# Patient Record
Sex: Female | Born: 1955
Health system: Southern US, Community
[De-identification: ages and names within clinical notes are randomized; demographics above are authoritative.]

## PROBLEM LIST (undated history)

## (undated) DIAGNOSIS — B019 Varicella without complication: Secondary | ICD-10-CM

## (undated) DIAGNOSIS — E785 Hyperlipidemia, unspecified: Secondary | ICD-10-CM

## (undated) DIAGNOSIS — R011 Cardiac murmur, unspecified: Secondary | ICD-10-CM

## (undated) DIAGNOSIS — M542 Cervicalgia: Secondary | ICD-10-CM

## (undated) DIAGNOSIS — K219 Gastro-esophageal reflux disease without esophagitis: Secondary | ICD-10-CM

## (undated) DIAGNOSIS — E119 Type 2 diabetes mellitus without complications: Secondary | ICD-10-CM

## (undated) DIAGNOSIS — M1990 Unspecified osteoarthritis, unspecified site: Secondary | ICD-10-CM

## (undated) DIAGNOSIS — J329 Chronic sinusitis, unspecified: Secondary | ICD-10-CM

## (undated) DIAGNOSIS — F419 Anxiety disorder, unspecified: Secondary | ICD-10-CM

## (undated) DIAGNOSIS — J449 Chronic obstructive pulmonary disease, unspecified: Secondary | ICD-10-CM

## (undated) DIAGNOSIS — F32A Depression, unspecified: Secondary | ICD-10-CM

## (undated) DIAGNOSIS — F329 Major depressive disorder, single episode, unspecified: Secondary | ICD-10-CM

## (undated) DIAGNOSIS — I10 Essential (primary) hypertension: Secondary | ICD-10-CM

## (undated) DIAGNOSIS — G8929 Other chronic pain: Secondary | ICD-10-CM

## (undated) DIAGNOSIS — F319 Bipolar disorder, unspecified: Secondary | ICD-10-CM

## (undated) DIAGNOSIS — M797 Fibromyalgia: Secondary | ICD-10-CM

## (undated) DIAGNOSIS — C3411 Malignant neoplasm of upper lobe, right bronchus or lung: Secondary | ICD-10-CM

## (undated) DIAGNOSIS — G43909 Migraine, unspecified, not intractable, without status migrainosus: Secondary | ICD-10-CM

## (undated) DIAGNOSIS — G35 Multiple sclerosis: Secondary | ICD-10-CM

## (undated) DIAGNOSIS — J302 Other seasonal allergic rhinitis: Secondary | ICD-10-CM

## (undated) HISTORY — DX: Hyperlipidemia, unspecified: E78.5

## (undated) HISTORY — DX: Essential (primary) hypertension: I10

## (undated) HISTORY — DX: Depression, unspecified: F32.A

## (undated) HISTORY — PX: FRACTURE SURGERY: SHX138

## (undated) HISTORY — PX: COLONOSCOPY: SHX174

## (undated) HISTORY — PX: COLONOSCOPY W/ BIOPSIES AND POLYPECTOMY: SHX1376

## (undated) HISTORY — DX: Unspecified osteoarthritis, unspecified site: M19.90

## (undated) HISTORY — DX: Anxiety disorder, unspecified: F41.9

## (undated) HISTORY — DX: Varicella without complication: B01.9

## (undated) HISTORY — PX: TUBAL LIGATION: SHX77

## (undated) HISTORY — PX: POLYPECTOMY: SHX149

## (undated) HISTORY — DX: Major depressive disorder, single episode, unspecified: F32.9

## (undated) HISTORY — PX: FACIAL RECONSTRUCTION SURGERY: SHX631

---
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
## 1995-01-22 HISTORY — PX: ABDOMINAL HYSTERECTOMY: SHX81

## 1995-01-22 HISTORY — PX: LAPAROSCOPIC CHOLECYSTECTOMY: SUR755

## 1996-01-22 HISTORY — PX: ABDOMINAL ADHESION SURGERY: SHX90

## 2015-01-24 DIAGNOSIS — H31091 Other chorioretinal scars, right eye: Secondary | ICD-10-CM | POA: Diagnosis not present

## 2015-01-24 DIAGNOSIS — H2513 Age-related nuclear cataract, bilateral: Secondary | ICD-10-CM | POA: Diagnosis not present

## 2015-01-24 DIAGNOSIS — H20011 Primary iridocyclitis, right eye: Secondary | ICD-10-CM | POA: Diagnosis not present

## 2015-03-02 DIAGNOSIS — E09618 Drug or chemical induced diabetes mellitus with other diabetic arthropathy: Secondary | ICD-10-CM | POA: Diagnosis not present

## 2015-03-02 DIAGNOSIS — E782 Mixed hyperlipidemia: Secondary | ICD-10-CM | POA: Diagnosis not present

## 2015-03-02 DIAGNOSIS — I10 Essential (primary) hypertension: Secondary | ICD-10-CM | POA: Diagnosis not present

## 2015-03-03 DIAGNOSIS — I10 Essential (primary) hypertension: Secondary | ICD-10-CM | POA: Diagnosis not present

## 2015-03-03 DIAGNOSIS — M797 Fibromyalgia: Secondary | ICD-10-CM | POA: Diagnosis not present

## 2015-03-03 DIAGNOSIS — E09618 Drug or chemical induced diabetes mellitus with other diabetic arthropathy: Secondary | ICD-10-CM | POA: Diagnosis not present

## 2015-03-03 DIAGNOSIS — E782 Mixed hyperlipidemia: Secondary | ICD-10-CM | POA: Diagnosis not present

## 2015-03-08 ENCOUNTER — Other Ambulatory Visit: Payer: Self-pay | Admitting: Family Medicine

## 2015-03-08 ENCOUNTER — Ambulatory Visit
Admission: RE | Admit: 2015-03-08 | Discharge: 2015-03-08 | Disposition: A | Discharge status: Home / Self Care | Payer: PPO | Source: Ambulatory Visit | Attending: Family Medicine | Admitting: Family Medicine

## 2015-03-08 DIAGNOSIS — M545 Low back pain: Secondary | ICD-10-CM | POA: Diagnosis not present

## 2015-03-08 DIAGNOSIS — M5489 Other dorsalgia: Secondary | ICD-10-CM

## 2015-03-08 DIAGNOSIS — M47816 Spondylosis without myelopathy or radiculopathy, lumbar region: Secondary | ICD-10-CM | POA: Diagnosis not present

## 2015-03-13 DIAGNOSIS — M545 Low back pain: Secondary | ICD-10-CM | POA: Diagnosis not present

## 2015-03-13 DIAGNOSIS — M6281 Muscle weakness (generalized): Secondary | ICD-10-CM | POA: Diagnosis not present

## 2015-03-27 DIAGNOSIS — M797 Fibromyalgia: Secondary | ICD-10-CM | POA: Diagnosis not present

## 2015-03-27 DIAGNOSIS — E782 Mixed hyperlipidemia: Secondary | ICD-10-CM | POA: Diagnosis not present

## 2015-03-27 DIAGNOSIS — I10 Essential (primary) hypertension: Secondary | ICD-10-CM | POA: Diagnosis not present

## 2015-03-27 DIAGNOSIS — H66001 Acute suppurative otitis media without spontaneous rupture of ear drum, right ear: Secondary | ICD-10-CM | POA: Diagnosis not present

## 2015-05-08 DIAGNOSIS — E785 Hyperlipidemia, unspecified: Secondary | ICD-10-CM | POA: Diagnosis not present

## 2015-05-08 DIAGNOSIS — G40001 Localization-related (focal) (partial) idiopathic epilepsy and epileptic syndromes with seizures of localized onset, not intractable, with status epilepticus: Secondary | ICD-10-CM | POA: Diagnosis not present

## 2015-05-08 DIAGNOSIS — Z6827 Body mass index (BMI) 27.0-27.9, adult: Secondary | ICD-10-CM | POA: Diagnosis not present

## 2015-05-08 DIAGNOSIS — I10 Essential (primary) hypertension: Secondary | ICD-10-CM | POA: Diagnosis not present

## 2015-05-08 DIAGNOSIS — E08618 Diabetes mellitus due to underlying condition with other diabetic arthropathy: Secondary | ICD-10-CM | POA: Diagnosis not present

## 2015-06-05 DIAGNOSIS — E09618 Drug or chemical induced diabetes mellitus with other diabetic arthropathy: Secondary | ICD-10-CM | POA: Diagnosis not present

## 2015-06-05 DIAGNOSIS — F17290 Nicotine dependence, other tobacco product, uncomplicated: Secondary | ICD-10-CM | POA: Diagnosis not present

## 2015-06-05 DIAGNOSIS — E782 Mixed hyperlipidemia: Secondary | ICD-10-CM | POA: Diagnosis not present

## 2015-06-05 DIAGNOSIS — G40001 Localization-related (focal) (partial) idiopathic epilepsy and epileptic syndromes with seizures of localized onset, not intractable, with status epilepticus: Secondary | ICD-10-CM | POA: Diagnosis not present

## 2015-06-05 DIAGNOSIS — E785 Hyperlipidemia, unspecified: Secondary | ICD-10-CM | POA: Diagnosis not present

## 2015-07-13 ENCOUNTER — Other Ambulatory Visit: Payer: Self-pay

## 2015-07-13 ENCOUNTER — Emergency Department (HOSPITAL_COMMUNITY)
Admission: EM | Admit: 2015-07-13 | Discharge: 2015-07-13 | Disposition: A | Discharge status: Home / Self Care | Payer: PPO | Attending: Emergency Medicine | Admitting: Emergency Medicine

## 2015-07-13 ENCOUNTER — Encounter (HOSPITAL_COMMUNITY): Payer: Self-pay | Admitting: Nurse Practitioner

## 2015-07-13 ENCOUNTER — Emergency Department (HOSPITAL_COMMUNITY): Payer: PPO

## 2015-07-13 DIAGNOSIS — F17200 Nicotine dependence, unspecified, uncomplicated: Secondary | ICD-10-CM | POA: Diagnosis not present

## 2015-07-13 DIAGNOSIS — R079 Chest pain, unspecified: Secondary | ICD-10-CM | POA: Insufficient documentation

## 2015-07-13 DIAGNOSIS — E119 Type 2 diabetes mellitus without complications: Secondary | ICD-10-CM | POA: Insufficient documentation

## 2015-07-13 DIAGNOSIS — R05 Cough: Secondary | ICD-10-CM | POA: Diagnosis not present

## 2015-07-13 DIAGNOSIS — M546 Pain in thoracic spine: Secondary | ICD-10-CM

## 2015-07-13 HISTORY — DX: Fibromyalgia: M79.7

## 2015-07-13 HISTORY — DX: Multiple sclerosis: G35

## 2015-07-13 HISTORY — DX: Bipolar disorder, unspecified: F31.9

## 2015-07-13 LAB — CBC
HCT: 47.2 % — ABNORMAL HIGH (ref 36.0–46.0)
Hemoglobin: 16 g/dL — ABNORMAL HIGH (ref 12.0–15.0)
MCH: 28.2 pg (ref 26.0–34.0)
MCHC: 33.9 g/dL (ref 30.0–36.0)
MCV: 83.2 fL (ref 78.0–100.0)
PLATELETS: 240 10*3/uL (ref 150–400)
RBC: 5.67 MIL/uL — AB (ref 3.87–5.11)
RDW: 14.1 % (ref 11.5–15.5)
WBC: 10.9 10*3/uL — ABNORMAL HIGH (ref 4.0–10.5)

## 2015-07-13 LAB — BASIC METABOLIC PANEL
Anion gap: 11 (ref 5–15)
BUN: 10 mg/dL (ref 6–20)
CALCIUM: 10.3 mg/dL (ref 8.9–10.3)
CO2: 23 mmol/L (ref 22–32)
CREATININE: 0.86 mg/dL (ref 0.44–1.00)
Chloride: 103 mmol/L (ref 101–111)
GFR calc non Af Amer: >60 mL/min (ref >60)
Glucose, Bld: 133 mg/dL — ABNORMAL HIGH (ref 65–99)
Potassium: 4 mmol/L (ref 3.5–5.1)
SODIUM: 137 mmol/L (ref 135–145)

## 2015-07-13 LAB — I-STAT TROPONIN, ED
TROPONIN I, POC: 0 ng/mL (ref 0.00–0.08)
Troponin i, poc: 0 ng/mL (ref 0.00–0.08)

## 2015-07-13 MED ORDER — MORPHINE SULFATE (PF) 4 MG/ML IV SOLN
4.0000 mg | Freq: Once | INTRAVENOUS | Status: AC
Start: 2015-07-13 — End: 2015-07-13
  Administered 2015-07-13: 4 mg via INTRAVENOUS
  Filled 2015-07-13: qty 1

## 2015-07-13 MED ORDER — MORPHINE SULFATE (PF) 4 MG/ML IV SOLN
4.0000 mg | Freq: Once | INTRAVENOUS | Status: AC
  Administered 2015-07-13: 4 mg via INTRAVENOUS
  Filled 2015-07-13: qty 1

## 2015-07-13 MED ORDER — ASPIRIN 81 MG PO CHEW
324.0000 mg | CHEWABLE_TABLET | Freq: Once | ORAL | Status: AC
  Administered 2015-07-13: 324 mg via ORAL
  Filled 2015-07-13: qty 4

## 2015-07-13 MED ORDER — CYCLOBENZAPRINE HCL 10 MG PO TABS: 10.0000 mg | ORAL_TABLET | Freq: Two times a day (BID) | ORAL | Status: DC | PRN

## 2015-07-13 MED ORDER — HYDROCODONE-ACETAMINOPHEN 5-325 MG PO TABS: 1.0000 | ORAL_TABLET | Freq: Four times a day (QID) | ORAL | Status: DC | PRN

## 2015-07-13 NOTE — ED Provider Notes (Signed)
CSN: [id number]     Arrival date & time 07/13/15  1321 History   First MD Initiated Contact with Patient 07/13/15 1615     Chief Complaint  Patient presents with  . Chest Pain     (Consider location/radiation/quality/duration/timing/severity/associated sxs/prior Treatment) HPI Comments: Patient presents to the emergency department with chief complaint of left-sided chest pain. She states pain started last night. She reports that the pain radiates to her left arm. She reports associated shortness of breath. She states that the pain came on while at rest. She states that she had a similar episode about a week ago. She denies any exertional symptoms. She states that she has had some productive cough, but denies any fevers. She denies any associated nausea or vomiting. She states that she is very concerned about her heart because her mother had severe coronary artery disease. She reports taking some ibuprofen last night with no relief. There are no modifying factors.  The history is provided by the patient. No language interpreter was used.    Past Medical History  Diagnosis Date  . Diabetes mellitus without complication ([place])   . Bipolar disorder ([place])   . Multiple sclerosis ([place])   . Fibromyalgia    Past Surgical History  Procedure Laterality Date  . Facial reconstruction surgery     History reviewed. No pertinent family history. Social History  Substance Use Topics  . Smoking status: Current Every Day Smoker  . Smokeless tobacco: None  . Alcohol Use: No   OB History    No data available     Review of Systems  Constitutional: Negative for fever and chills.  Respiratory: Positive for shortness of breath.   Cardiovascular: Positive for chest pain.  Gastrointestinal: Negative for nausea, vomiting, diarrhea and constipation.  Genitourinary: Negative for dysuria.  All other systems reviewed and are negative.     Allergies  Review of patient's allergies indicates no known  allergies.  Home Medications   Prior to Admission medications   Not on File   BP 132/101 mmHg  Pulse 98  Temp(Src) 98.4 F (36.9 C) (Oral)  Resp 19  SpO2 99% Physical Exam  Constitutional: She is oriented to person, place, and time. She appears well-developed and well-nourished.  HENT:  Head: Normocephalic and atraumatic.  Eyes: Conjunctivae and EOM are normal. Pupils are equal, round, and reactive to light.  Neck: Normal range of motion. Neck supple.  Cardiovascular: Normal rate and regular rhythm.  Exam reveals no gallop and no friction rub.   No murmur heard. Pulmonary/Chest: Effort normal and breath sounds normal. No respiratory distress. She has no wheezes. She has no rales. She exhibits no tenderness.  Abdominal: Soft. Bowel sounds are normal. She exhibits no distension and no mass. There is no tenderness. There is no rebound and no guarding.  Musculoskeletal: Normal range of motion. She exhibits no edema or tenderness.  Neurological: She is alert and oriented to person, place, and time.  Skin: Skin is warm and dry.  Psychiatric: She has a normal mood and affect. Her behavior is normal. Judgment and thought content normal.  Nursing note and vitals reviewed.   ED Course  Procedures (including critical care time) Results for orders placed or performed during the hospital encounter of 78/29/56  Basic metabolic panel  Result Value Ref Range   Sodium 137 135 - 145 mmol/L   Potassium 4.0 3.5 - 5.1 mmol/L   Chloride 103 101 - 111 mmol/L   CO2 23 22 - 32 mmol/L  Glucose, Bld 133 (H) 65 - 99 mg/dL   BUN 10 6 - 20 mg/dL   Creatinine, Ser 0.86 0.44 - 1.00 mg/dL   Calcium 10.3 8.9 - 10.3 mg/dL   GFR calc non Af Amer >60 >60 mL/min   GFR calc Af Amer >60 >60 mL/min   Anion gap 11 5 - 15  CBC  Result Value Ref Range   WBC 10.9 (H) 4.0 - 10.5 K/uL   RBC 5.67 (H) 3.87 - 5.11 MIL/uL   Hemoglobin 16.0 (H) 12.0 - 15.0 g/dL   HCT 47.2 (H) 36.0 - 46.0 %   MCV 83.2 78.0 - 100.0  fL   MCH 28.2 26.0 - 34.0 pg   MCHC 33.9 30.0 - 36.0 g/dL   RDW 14.1 11.5 - 15.5 %   Platelets 240 150 - 400 K/uL  I-stat troponin, ED  Result Value Ref Range   Troponin i, poc 0.00 0.00 - 0.08 ng/mL   Comment 3          I-stat troponin, ED  Result Value Ref Range   Troponin i, poc 0.00 0.00 - 0.08 ng/mL   Comment 3           Dg Chest 2 View  07/13/2015  CLINICAL DATA:  Left sided lower back and left upper anterior chest pain with left arm pain (numb) x last weekend, cough (dry) x 1 month, chills, HTN, diabetic, past smoker - quit 2 weeks ago, was smoking 1/2 ppd x age 68, no heart/lung surgeries EXAM: CHEST  2 VIEW COMPARISON:  None. FINDINGS: Heart size is normal. There is minimal subsegmental atelectasis or scarring at the lung bases. No focal consolidations or pleural effusions. No pulmonary edema. Visualized osseous structures have a normal appearance. IMPRESSION: 1. Streaky atelectasis or scarring at the bases. 2.  No evidence for acute cardiopulmonary abnormality. Electronically Signed   By: Nolon Nations M.D.   On: 07/13/2015 13:57     Imaging Review Dg Chest 2 View  07/13/2015  CLINICAL DATA:  Left sided lower back and left upper anterior chest pain with left arm pain (numb) x last weekend, cough (dry) x 1 month, chills, HTN, diabetic, past smoker - quit 2 weeks ago, was smoking 1/2 ppd x age 60, no heart/lung surgeries EXAM: CHEST  2 VIEW COMPARISON:  None. FINDINGS: Heart size is normal. There is minimal subsegmental atelectasis or scarring at the lung bases. No focal consolidations or pleural effusions. No pulmonary edema. Visualized osseous structures have a normal appearance. IMPRESSION: 1. Streaky atelectasis or scarring at the bases. 2.  No evidence for acute cardiopulmonary abnormality. Electronically Signed   By: Nolon Nations M.D.   On: 07/13/2015 13:57   I have personally reviewed and evaluated these images and lab results as part of my medical decision-making.    EKG Interpretation None      MDM   Final diagnoses:  Chest pain, unspecified chest pain type  Bilateral thoracic back pain    Patient with chief complaint of chest pain. Pain came on last night. It is been constant until now. She reports associated shortness of breath, left arm pain, and diaphoresis. She reports some associated shortness of breath.  Discussed with Dr. Venora Maples, who states that pain sounds atypical.  Patient seems quite nervous at the bedside, ?anxiety.  Will check delta troponin and EKG.  Wells' PE criteria is 1.5- low risk.  PE considered, but thought to be less likely.    Delta trop and EKG are  negative.  Patient states that she doesn't have any additional chest pain, but she does complain of some back pain from when she had a recent mechanical fall.  Will given something for muscle aches and spasm.       Montine Circle, PA-C 07/13/15 2009  Jola Schmidt, MD 07/14/15 [phone]

## 2015-07-13 NOTE — ED Notes (Signed)
she c/o 1 day history of L upper back pain radiating into her L chest, abd, and L arm. She tried ibuprofen for the pain yesterday with no relief. she woke today with worsening pain. She c/o productive cough, sob, chills. Denies n/v, dizziness. She is alert and breathing easily

## 2015-07-13 NOTE — Discharge Instructions (Signed)
Back Pain, Adult °Back pain is very common in adults. The cause of back pain is rarely dangerous and the pain often gets better over time. The cause of your back pain may not be known. Some common causes of back pain include: °· Strain of the muscles or ligaments supporting the spine. °· Wear and tear (degeneration) of the spinal disks. °· Arthritis. °· Direct injury to the back. °For many people, back pain may return. Since back pain is rarely dangerous, most people can learn to manage this condition on their own. °HOME CARE INSTRUCTIONS °Watch your back pain for any changes. The following actions may help to lessen any discomfort you are feeling: °· Remain active. It is stressful on your back to sit or stand in one place for long periods of time. Do not sit, drive, or stand in one place for more than 30 minutes at a time. Take short walks on even surfaces as soon as you are able. Try to increase the length of time you walk each day. °· Exercise regularly as directed by your health care provider. Exercise helps your back heal faster. It also helps avoid future injury by keeping your muscles strong and flexible. °· Do not stay in bed. Resting more than 1-2 days can delay your recovery. °· Pay attention to your body when you bend and lift. The most comfortable positions are those that put less stress on your recovering back. Always use proper lifting techniques, including: °· Bending your knees. °· Keeping the load close to your body. °· Avoiding twisting. °· Find a comfortable position to sleep. Use a firm mattress and lie on your side with your knees slightly bent. If you lie on your back, put a pillow under your knees. °· Avoid feeling anxious or stressed. Stress increases muscle tension and can worsen back pain. It is important to recognize when you are anxious or stressed and learn ways to manage it, such as with exercise. °· Take medicines only as directed by your health care provider. Over-the-counter  medicines to reduce pain and inflammation are often the most helpful. Your health care provider may prescribe muscle relaxant drugs. These medicines help dull your pain so you can more quickly return to your normal activities and healthy exercise. °· Apply ice to the injured area: °· Put ice in a plastic bag. °· Place a towel between your skin and the bag. °· Leave the ice on for 20 minutes, 2-3 times a day for the first 2-3 days. After that, ice and heat may be alternated to reduce pain and spasms. °· Maintain a healthy weight. Excess weight puts extra stress on your back and makes it difficult to maintain good posture. °SEEK MEDICAL CARE IF: °· You have pain that is not relieved with rest or medicine. °· You have increasing pain going down into the legs or buttocks. °· You have pain that does not improve in one week. °· You have night pain. °· You lose weight. °· You have a fever or chills. °SEEK IMMEDIATE MEDICAL CARE IF:  °· You develop new bowel or bladder control problems. °· You have unusual weakness or numbness in your arms or legs. °· You develop nausea or vomiting. °· You develop abdominal pain. °· You feel faint. °  °This information is not intended to replace advice given to you by your health care provider. Make sure you discuss any questions you have with your health care provider. °  °Document Released: 01/07/2005 Document Revised: 01/28/2014 Document Reviewed: 05/11/2013 °Elsevier Interactive Patient Education ©2016 Elsevier   Inc. ° °Nonspecific Chest Pain  °Chest pain can be caused by many different conditions. There is always a chance that your pain could be related to something serious, such as a heart attack or a blood clot in your lungs. Chest pain can also be caused by conditions that are not life-threatening. If you have chest pain, it is very important to follow up with your health care provider. °CAUSES  °Chest pain can be caused by: °· Heartburn. °· Pneumonia or bronchitis. °· Anxiety or  stress. °· Inflammation around your heart (pericarditis) or lung (pleuritis or pleurisy). °· A blood clot in your lung. °· A collapsed lung (pneumothorax). It can develop suddenly on its own (spontaneous pneumothorax) or from trauma to the chest. °· Shingles infection (varicella-zoster virus). °· Heart attack. °· Damage to the bones, muscles, and cartilage that make up your chest wall. This can include: °¨ Bruised bones due to injury. °¨ Strained muscles or cartilage due to frequent or repeated coughing or overwork. °¨ Fracture to one or more ribs. °¨ Sore cartilage due to inflammation (costochondritis). °RISK FACTORS  °Risk factors for chest pain may include: °· Activities that increase your risk for trauma or injury to your chest. °· Respiratory infections or conditions that cause frequent coughing. °· Medical conditions or overeating that can cause heartburn. °· Heart disease or family history of heart disease. °· Conditions or health behaviors that increase your risk of developing a blood clot. °· Having had chicken pox (varicella zoster). °SIGNS AND SYMPTOMS °Chest pain can feel like: °· Burning or tingling on the surface of your chest or deep in your chest. °· Crushing, pressure, aching, or squeezing pain. °· Dull or sharp pain that is worse when you move, cough, or take a deep breath. °· Pain that is also felt in your back, neck, shoulder, or arm, or pain that spreads to any of these areas. °Your chest pain may come and go, or it may stay constant. °DIAGNOSIS °Lab tests or other studies may be needed to find the cause of your pain. Your health care provider may have you take a test called an ambulatory ECG (electrocardiogram). An ECG records your heartbeat patterns at the time the test is performed. You may also have other tests, such as: °· Transthoracic echocardiogram (TTE). During echocardiography, sound waves are used to create a picture of all of the heart structures and to look at how blood flows  through your heart. °· Transesophageal echocardiogram (TEE). This is a more advanced imaging test that obtains images from inside your body. It allows your health care provider to see your heart in finer detail. °· Cardiac monitoring. This allows your health care provider to monitor your heart rate and rhythm in real time. °· Holter monitor. This is a portable device that records your heartbeat and can help to diagnose abnormal heartbeats. It allows your health care provider to track your heart activity for several days, if needed. °· Stress tests. These can be done through exercise or by taking medicine that makes your heart beat more quickly. °· Blood tests. °· Imaging tests. °TREATMENT  °Your treatment depends on what is causing your chest pain. Treatment may include: °· Medicines. These may include: °¨ Acid blockers for heartburn. °¨ Anti-inflammatory medicine. °¨ Pain medicine for inflammatory conditions. °¨ Antibiotic medicine, if an infection is present. °¨ Medicines to dissolve blood clots. °¨ Medicines to treat coronary artery disease. °· Supportive care for conditions that do not require medicines. This may include: °¨ Resting. °¨   Applying heat or cold packs to injured areas. °¨ Limiting activities until pain decreases. °HOME CARE INSTRUCTIONS °· If you were prescribed an antibiotic medicine, finish it all even if you start to feel better. °· Avoid any activities that bring on chest pain. °· Do not use any tobacco products, including cigarettes, chewing tobacco, or electronic cigarettes. If you need help quitting, ask your health care provider. °· Do not drink alcohol. °· Take medicines only as directed by your health care provider. °· Keep all follow-up visits as directed by your health care provider. This is important. This includes any further testing if your chest pain does not go away. °· If heartburn is the cause for your chest pain, you may be told to keep your head raised (elevated) while sleeping.  This reduces the chance that acid will go from your stomach into your esophagus. °· Make lifestyle changes as directed by your health care provider. These may include: °¨ Getting regular exercise. Ask your health care provider to suggest some activities that are safe for you. °¨ Eating a heart-healthy diet. A registered dietitian can help you to learn healthy eating options. °¨ Maintaining a healthy weight. °¨ Managing diabetes, if necessary. °¨ Reducing stress. °SEEK MEDICAL CARE IF: °· Your chest pain does not go away after treatment. °· You have a rash with blisters on your chest. °· You have a fever. °SEEK IMMEDIATE MEDICAL CARE IF:  °· Your chest pain is worse. °· You have an increasing cough, or you cough up blood. °· You have severe abdominal pain. °· You have severe weakness. °· You faint. °· You have chills. °· You have sudden, unexplained chest discomfort. °· You have sudden, unexplained discomfort in your arms, back, neck, or jaw. °· You have shortness of breath at any time. °· You suddenly start to sweat, or your skin gets clammy. °· You feel nauseous or you vomit. °· You suddenly feel light-headed or dizzy. °· Your heart begins to beat quickly, or it feels like it is skipping beats. °These symptoms may represent a serious problem that is an emergency. Do not wait to see if the symptoms will go away. Get medical help right away. Call your local emergency services (911 in the [place] Do not drive yourself to the hospital. °  °This information is not intended to replace advice given to you by your health care provider. Make sure you discuss any questions you have with your health care provider. °  °Document Released: 10/17/2004 Document Revised: 01/28/2014 Document Reviewed: 08/13/2013 °Elsevier Interactive Patient Education ©2016 Elsevier Inc. ° °

## 2015-10-02 DIAGNOSIS — E782 Mixed hyperlipidemia: Secondary | ICD-10-CM | POA: Diagnosis not present

## 2015-10-02 DIAGNOSIS — M797 Fibromyalgia: Secondary | ICD-10-CM | POA: Diagnosis not present

## 2015-10-02 DIAGNOSIS — F17290 Nicotine dependence, other tobacco product, uncomplicated: Secondary | ICD-10-CM | POA: Diagnosis not present

## 2015-10-02 DIAGNOSIS — G40001 Localization-related (focal) (partial) idiopathic epilepsy and epileptic syndromes with seizures of localized onset, not intractable, with status epilepticus: Secondary | ICD-10-CM | POA: Diagnosis not present

## 2015-10-02 DIAGNOSIS — I10 Essential (primary) hypertension: Secondary | ICD-10-CM | POA: Diagnosis not present

## 2015-10-02 DIAGNOSIS — E09618 Drug or chemical induced diabetes mellitus with other diabetic arthropathy: Secondary | ICD-10-CM | POA: Diagnosis not present

## 2015-10-03 DIAGNOSIS — M797 Fibromyalgia: Secondary | ICD-10-CM | POA: Diagnosis not present

## 2015-10-03 DIAGNOSIS — G40011 Localization-related (focal) (partial) idiopathic epilepsy and epileptic syndromes with seizures of localized onset, intractable, with status epilepticus: Secondary | ICD-10-CM | POA: Diagnosis not present

## 2015-10-03 DIAGNOSIS — E782 Mixed hyperlipidemia: Secondary | ICD-10-CM | POA: Diagnosis not present

## 2015-10-03 DIAGNOSIS — Z6828 Body mass index (BMI) 28.0-28.9, adult: Secondary | ICD-10-CM | POA: Diagnosis not present

## 2015-11-17 DIAGNOSIS — Z23 Encounter for immunization: Secondary | ICD-10-CM | POA: Diagnosis not present

## 2015-11-17 DIAGNOSIS — E09618 Drug or chemical induced diabetes mellitus with other diabetic arthropathy: Secondary | ICD-10-CM | POA: Diagnosis not present

## 2015-11-17 DIAGNOSIS — F17290 Nicotine dependence, other tobacco product, uncomplicated: Secondary | ICD-10-CM | POA: Diagnosis not present

## 2015-11-17 DIAGNOSIS — E785 Hyperlipidemia, unspecified: Secondary | ICD-10-CM | POA: Diagnosis not present

## 2015-11-17 DIAGNOSIS — G40001 Localization-related (focal) (partial) idiopathic epilepsy and epileptic syndromes with seizures of localized onset, not intractable, with status epilepticus: Secondary | ICD-10-CM | POA: Diagnosis not present

## 2015-11-28 DIAGNOSIS — H2513 Age-related nuclear cataract, bilateral: Secondary | ICD-10-CM | POA: Diagnosis not present

## 2015-11-28 DIAGNOSIS — H02421 Myogenic ptosis of right eyelid: Secondary | ICD-10-CM | POA: Diagnosis not present

## 2015-11-28 DIAGNOSIS — E119 Type 2 diabetes mellitus without complications: Secondary | ICD-10-CM | POA: Diagnosis not present

## 2015-11-28 DIAGNOSIS — H20011 Primary iridocyclitis, right eye: Secondary | ICD-10-CM | POA: Diagnosis not present

## 2015-11-28 DIAGNOSIS — H31091 Other chorioretinal scars, right eye: Secondary | ICD-10-CM | POA: Diagnosis not present

## 2016-01-01 DIAGNOSIS — E782 Mixed hyperlipidemia: Secondary | ICD-10-CM | POA: Diagnosis not present

## 2016-01-01 DIAGNOSIS — I10 Essential (primary) hypertension: Secondary | ICD-10-CM | POA: Diagnosis not present

## 2016-01-01 DIAGNOSIS — E785 Hyperlipidemia, unspecified: Secondary | ICD-10-CM | POA: Diagnosis not present

## 2016-01-01 DIAGNOSIS — G40001 Localization-related (focal) (partial) idiopathic epilepsy and epileptic syndromes with seizures of localized onset, not intractable, with status epilepticus: Secondary | ICD-10-CM | POA: Diagnosis not present

## 2016-02-24 DIAGNOSIS — I10 Essential (primary) hypertension: Secondary | ICD-10-CM | POA: Diagnosis not present

## 2016-02-24 DIAGNOSIS — G40001 Localization-related (focal) (partial) idiopathic epilepsy and epileptic syndromes with seizures of localized onset, not intractable, with status epilepticus: Secondary | ICD-10-CM | POA: Diagnosis not present

## 2016-02-29 ENCOUNTER — Institutional Professional Consult (permissible substitution): Payer: Self-pay | Admitting: Family Medicine

## 2016-03-25 DIAGNOSIS — I10 Essential (primary) hypertension: Secondary | ICD-10-CM | POA: Diagnosis not present

## 2016-03-25 DIAGNOSIS — E09618 Drug or chemical induced diabetes mellitus with other diabetic arthropathy: Secondary | ICD-10-CM | POA: Diagnosis not present

## 2016-03-25 DIAGNOSIS — F17290 Nicotine dependence, other tobacco product, uncomplicated: Secondary | ICD-10-CM | POA: Diagnosis not present

## 2016-03-25 DIAGNOSIS — G40011 Localization-related (focal) (partial) idiopathic epilepsy and epileptic syndromes with seizures of localized onset, intractable, with status epilepticus: Secondary | ICD-10-CM | POA: Diagnosis not present

## 2016-03-25 DIAGNOSIS — G40001 Localization-related (focal) (partial) idiopathic epilepsy and epileptic syndromes with seizures of localized onset, not intractable, with status epilepticus: Secondary | ICD-10-CM | POA: Diagnosis not present

## 2016-03-27 DIAGNOSIS — M542 Cervicalgia: Secondary | ICD-10-CM | POA: Diagnosis not present

## 2016-04-04 ENCOUNTER — Encounter: Payer: Self-pay | Admitting: Nurse Practitioner

## 2016-04-04 ENCOUNTER — Ambulatory Visit (INDEPENDENT_AMBULATORY_CARE_PROVIDER_SITE_OTHER): Payer: PPO | Admitting: Nurse Practitioner

## 2016-04-04 VITALS — BP 118/70 | HR 91 | Temp 98.4°F | Ht 68.0 in | Wt 173.0 lb

## 2016-04-04 DIAGNOSIS — R29898 Other symptoms and signs involving the musculoskeletal system: Secondary | ICD-10-CM | POA: Diagnosis not present

## 2016-04-04 DIAGNOSIS — I10 Essential (primary) hypertension: Secondary | ICD-10-CM | POA: Diagnosis not present

## 2016-04-04 DIAGNOSIS — G8929 Other chronic pain: Secondary | ICD-10-CM | POA: Insufficient documentation

## 2016-04-04 DIAGNOSIS — F325 Major depressive disorder, single episode, in full remission: Secondary | ICD-10-CM | POA: Insufficient documentation

## 2016-04-04 DIAGNOSIS — E118 Type 2 diabetes mellitus with unspecified complications: Secondary | ICD-10-CM

## 2016-04-04 DIAGNOSIS — F339 Major depressive disorder, recurrent, unspecified: Secondary | ICD-10-CM

## 2016-04-04 DIAGNOSIS — F329 Major depressive disorder, single episode, unspecified: Secondary | ICD-10-CM

## 2016-04-04 DIAGNOSIS — E119 Type 2 diabetes mellitus without complications: Secondary | ICD-10-CM | POA: Insufficient documentation

## 2016-04-04 DIAGNOSIS — E1169 Type 2 diabetes mellitus with other specified complication: Secondary | ICD-10-CM | POA: Insufficient documentation

## 2016-04-04 DIAGNOSIS — G894 Chronic pain syndrome: Secondary | ICD-10-CM | POA: Diagnosis not present

## 2016-04-04 DIAGNOSIS — F319 Bipolar disorder, unspecified: Secondary | ICD-10-CM | POA: Insufficient documentation

## 2016-04-04 DIAGNOSIS — E781 Pure hyperglyceridemia: Secondary | ICD-10-CM | POA: Insufficient documentation

## 2016-04-04 DIAGNOSIS — E785 Hyperlipidemia, unspecified: Secondary | ICD-10-CM | POA: Diagnosis not present

## 2016-04-04 DIAGNOSIS — Z0000 Encounter for general adult medical examination without abnormal findings: Secondary | ICD-10-CM | POA: Diagnosis not present

## 2016-04-04 NOTE — Progress Notes (Signed)
Pre visit review using our clinic review tool, if applicable. No additional management support is needed unless otherwise documented below in the visit note. 

## 2016-04-04 NOTE — Progress Notes (Signed)
Subjective:    Patient ID: Audrey Russell, female    DOB: 10-06-1955, 61 y.o.   MRN: [id number]  Patient presents today for establish care (new patient)  HPI Moved from [place] to [place] last year.   Previous pcp was Dr. Lucianne Lei with [facility]. Last seen 03/25/2016.   Diabetes: She has concerned about possible adverse effects of Janumet due to class action lawsuit advertised on TV. She does not remember which adverse reaction that was mentioned. She reports that her Dr. Criss Rosales was to switch medication but never did. But does not remember name of medication. Does not remember last Hgba1c.  Chronic pain and leg weakness:  ongoing for several years, waxing and waning, referred to [place], started PT last week, can't afford to go BID as recommended.  Hx of MS: no current treatment. Previous use of oral prednisone by neurologist in [place].  Bipolar Depression: managed with Lamictal, citalopram, klonopin, and Remeron. Denies any mood swings.  Immunizations: (TDAP, Hep C screen, Pneumovax, Influenza, zoster)  Health Maintenance  Topic Date Due  . Hemoglobin A1C  02/11/1955  .  Hepatitis C: One time screening is recommended by Center for Disease Control  (CDC) for  adults born from [birth year] through 1965.   30-Nov-1955  . Pneumococcal vaccine (1) 01/05/1958  . Complete foot exam   01/05/1966  . Eye exam for diabetics  01/05/1966  . HIV Screening  01/06/1971  . Tetanus Vaccine  01/06/1975  . Pap Smear  01/05/1977  . Mammogram  01/05/2006  . Colon Cancer Screening  01/05/2006  . Flu Shot  Addressed   Diet:regular Weight:  Wt Readings from Last 3 Encounters:  04/04/16 173 lb (78.5 kg)    Exercise:none Fall Risk: Fall Risk  04/04/2016  Falls in the past year? No   Home Safety:home with daughter Depression/Suicide: Depression screen [facility] 2/9 04/04/2016  Decreased Interest 1  Down, Depressed, Hopeless 1  PHQ - 2 Score 2  Altered sleeping 3  Tired, decreased  energy 3  Change in appetite 1  Feeling bad or failure about yourself  0  Trouble concentrating 1  Moving slowly or fidgety/restless 0  Suicidal thoughts 0  PHQ-9 Score 10   No flowsheet data found.  Medications and allergies reviewed with patient and updated if appropriate.  Patient Active Problem List   Diagnosis Date Noted  . HTN (hypertension), benign 04/04/2016  . DM (diabetes mellitus) ([place]) 04/04/2016  . Chronic pain 04/04/2016  . Depression 04/04/2016  . Hyperlipidemia 04/04/2016  . Leg weakness, bilateral 04/04/2016    Current Outpatient Prescriptions on File Prior to Visit  Medication Sig Dispense Refill  . acetaminophen (TYLENOL) 500 MG tablet Take 1,000 mg by mouth every 6 (six) hours as needed for mild pain or moderate pain.    . citalopram (CELEXA) 40 MG tablet Take 40 mg by mouth at bedtime.     . clonazePAM (KLONOPIN) 2 MG tablet Take 2-4 mg by mouth at bedtime.    Marland Kitchen ezetimibe (ZETIA) 10 MG tablet Take 10 mg by mouth every evening.    Marland Kitchen ibuprofen (ADVIL,MOTRIN) 200 MG tablet Take 200-400 mg by mouth every 6 (six) hours as needed for mild pain or moderate pain.    Marland Kitchen JANUMET XR [phone] MG TB24 Take 1 tablet by mouth at bedtime.    . lamoTRIgine (LAMICTAL) 25 MG tablet Take 50 mg by mouth 2 (two) times daily.     . mirtazapine (REMERON) 30 MG tablet Take 30 mg by  mouth at bedtime.    . ranitidine (ZANTAC) 300 MG tablet Take 300 mg by mouth at bedtime.     No current facility-administered medications on file prior to visit.     Past Medical History:  Diagnosis Date  . Arthritis   . Bipolar disorder ([place])   . Chickenpox   . Depression   . Diabetes mellitus without complication ([place])   . Fibromyalgia   . Hyperlipidemia   . Hypertension   . Multiple sclerosis ([place])     Past Surgical History:  Procedure Laterality Date  . ABDOMINAL HYSTERECTOMY  1997&1998   partial  . CHOLECYSTECTOMY  1997  . FACIAL RECONSTRUCTION SURGERY      Social History    Social History  . Marital status: Widowed    Spouse name: N/A  . Number of children: N/A  . Years of education: N/A   Social History Main Topics  . Smoking status: Former Research scientist (life sciences)  . Smokeless tobacco: Never Used  . Alcohol use No  . Drug use: No  . Sexual activity: Not Asked   Other Topics Concern  . None   Social History Narrative  . None    Family History  Problem Relation Age of Onset  . Arthritis Mother   . Heart disease Mother   . Stroke Mother   . Hypertension Mother   . Epilepsy Mother   . Heart disease Father   . Hypertension Father   . Diabetes Father   . Arthritis Maternal Grandmother   . Diabetes Paternal Grandmother         Review of Systems  Constitutional: Negative for fever, malaise/fatigue and weight loss.  HENT: Negative for congestion and sore throat.   Eyes:       Negative for visual changes  Respiratory: Negative for cough and shortness of breath.   Cardiovascular: Negative for chest pain, palpitations and leg swelling.  Gastrointestinal: Negative for blood in stool, constipation, diarrhea and heartburn.  Genitourinary: Negative for dysuria, frequency and urgency.  Musculoskeletal: Positive for joint pain, myalgias and neck pain. Negative for falls.  Skin: Negative for rash.  Neurological: Positive for focal weakness. Negative for dizziness, sensory change and headaches.  Endo/Heme/Allergies: Does not bruise/bleed easily.  Psychiatric/Behavioral: Positive for depression. Negative for hallucinations, memory loss, substance abuse and suicidal ideas. The patient is nervous/anxious and has insomnia.     Objective:   Vitals:   04/04/16 1423  BP: 118/70  Pulse: 91  Temp: 98.4 F (36.9 C)    Body mass index is 26.3 kg/m.   Physical Examination:  Physical Exam  Constitutional: She is oriented to person, place, and time. No distress.  Neck: Normal range of motion. Neck supple.  Cardiovascular: Normal rate, regular rhythm and normal  heart sounds.   Pulmonary/Chest: Effort normal and breath sounds normal.  Abdominal: Soft.  Musculoskeletal: Normal range of motion. She exhibits no edema or tenderness.  Neurological: She is alert and oriented to person, place, and time. She has normal reflexes. She displays weakness. She displays no tremor, facial symmetry and normal speech. No cranial nerve deficit. Gait normal. Coordination and gait normal.  Left LE weakness 4/5  Skin: Skin is warm and dry.  Vitals reviewed.   ASSESSMENT and PLAN:  Audrey Russell was seen today for establish care.  Diagnoses and all orders for this visit:  Encounter for medical examination to establish care  HTN (hypertension), benign  Type 2 diabetes mellitus with complication, without long-term current use of insulin ([place])  Chronic  pain syndrome -     Ambulatory referral to Neurology  Episode of recurrent major depressive disorder, unspecified depression episode severity ([place])  Hyperlipidemia, unspecified hyperlipidemia type  Leg weakness, bilateral -     Ambulatory referral to Neurology   No problem-specific Assessment & Plan notes found for this encounter.     Follow up: Return in about 1 month (around 05/05/2016) for DM and HTN, hyperlipidemia.  Wilfred Lacy, NP

## 2016-04-04 NOTE — Patient Instructions (Addendum)
Need records from [facility]. (sign medical release).  Will need records from previous pcp prior to filling any prescription and/or making any medication changes.

## 2016-05-02 ENCOUNTER — Ambulatory Visit: Payer: Self-pay | Admitting: Neurology

## 2016-05-07 ENCOUNTER — Ambulatory Visit: Payer: PPO | Admitting: Nurse Practitioner

## 2016-05-24 ENCOUNTER — Encounter: Payer: Self-pay | Admitting: Internal Medicine

## 2016-05-24 ENCOUNTER — Other Ambulatory Visit (INDEPENDENT_AMBULATORY_CARE_PROVIDER_SITE_OTHER): Payer: PPO

## 2016-05-24 ENCOUNTER — Ambulatory Visit (INDEPENDENT_AMBULATORY_CARE_PROVIDER_SITE_OTHER): Payer: PPO | Admitting: Internal Medicine

## 2016-05-24 ENCOUNTER — Ambulatory Visit: Payer: PPO | Admitting: Nurse Practitioner

## 2016-05-24 VITALS — BP 100/64 | HR 100 | Ht 68.0 in | Wt 168.0 lb

## 2016-05-24 DIAGNOSIS — R109 Unspecified abdominal pain: Secondary | ICD-10-CM | POA: Diagnosis not present

## 2016-05-24 DIAGNOSIS — G35 Multiple sclerosis: Secondary | ICD-10-CM | POA: Insufficient documentation

## 2016-05-24 DIAGNOSIS — R197 Diarrhea, unspecified: Secondary | ICD-10-CM | POA: Diagnosis not present

## 2016-05-24 DIAGNOSIS — I10 Essential (primary) hypertension: Secondary | ICD-10-CM | POA: Diagnosis not present

## 2016-05-24 DIAGNOSIS — F319 Bipolar disorder, unspecified: Secondary | ICD-10-CM | POA: Insufficient documentation

## 2016-05-24 LAB — URINALYSIS, ROUTINE W REFLEX MICROSCOPIC
Hgb urine dipstick: NEGATIVE
KETONES UR: NEGATIVE
NITRITE: NEGATIVE
PH: 5.5 (ref 5.0–8.0)
RBC / HPF: NONE SEEN (ref 0–?)
UROBILINOGEN UA: 0.2 (ref 0.0–1.0)
Urine Glucose: NEGATIVE

## 2016-05-24 LAB — BASIC METABOLIC PANEL
BUN: 17 mg/dL (ref 6–23)
CHLORIDE: 106 meq/L (ref 96–112)
CO2: 24 mEq/L (ref 19–32)
CREATININE: 0.97 mg/dL (ref 0.40–1.20)
Calcium: 9.7 mg/dL (ref 8.4–10.5)
GFR: 75.24 mL/min (ref >60.00)
GLUCOSE: 115 mg/dL — AB (ref 70–99)
Potassium: 3.7 mEq/L (ref 3.5–5.1)
Sodium: 137 mEq/L (ref 135–145)

## 2016-05-24 LAB — CBC WITH DIFFERENTIAL/PLATELET
BASOS ABS: 0 10*3/uL (ref 0.0–0.1)
BASOS PCT: 0.2 % (ref 0.0–3.0)
EOS ABS: 0.1 10*3/uL (ref 0.0–0.7)
Eosinophils Relative: 1 % (ref 0.0–5.0)
HEMATOCRIT: 43.8 % (ref 36.0–46.0)
HEMOGLOBIN: 14.6 g/dL (ref 12.0–15.0)
LYMPHS PCT: 35.1 % (ref 12.0–46.0)
Lymphs Abs: 4.2 10*3/uL — ABNORMAL HIGH (ref 0.7–4.0)
MCHC: 33.2 g/dL (ref 30.0–36.0)
MCV: 83.1 fl (ref 78.0–100.0)
Monocytes Absolute: 0.9 10*3/uL (ref 0.1–1.0)
Monocytes Relative: 7.5 % (ref 3.0–12.0)
Neutro Abs: 6.8 10*3/uL (ref 1.4–7.7)
Neutrophils Relative %: 56.2 % (ref 43.0–77.0)
Platelets: 252 10*3/uL (ref 150.0–400.0)
RBC: 5.27 Mil/uL — ABNORMAL HIGH (ref 3.87–5.11)
RDW: 14.8 % (ref 11.5–15.5)
WBC: 12 10*3/uL — AB (ref 4.0–10.5)

## 2016-05-24 LAB — HEPATIC FUNCTION PANEL
ALBUMIN: 4.5 g/dL (ref 3.5–5.2)
ALT: 11 U/L (ref 0–35)
AST: 15 U/L (ref 0–37)
Alkaline Phosphatase: 79 U/L (ref 39–117)
BILIRUBIN TOTAL: 0.3 mg/dL (ref 0.2–1.2)
Bilirubin, Direct: 0.1 mg/dL (ref 0.0–0.3)
TOTAL PROTEIN: 8 g/dL (ref 6.0–8.3)

## 2016-05-24 LAB — LIPASE: Lipase: 40 U/L (ref 11.0–59.0)

## 2016-05-24 MED ORDER — METRONIDAZOLE 250 MG PO TABS: 250.0000 mg | ORAL_TABLET | Freq: Three times a day (TID) | ORAL | 0 refills | Status: AC

## 2016-05-24 MED ORDER — ONDANSETRON HCL 4 MG PO TABS: 4.0000 mg | ORAL_TABLET | Freq: Three times a day (TID) | ORAL | 1 refills | Status: DC | PRN

## 2016-05-24 NOTE — Patient Instructions (Signed)
Please take all new medication as prescribed - the antibiotic, and nausea medication  Please drink as much fluids as you can over the next several days  Please continue all other medications as before, and refills have been done if requested.  Please have the pharmacy call with any other refills you may need.  Please keep your appointments with your specialists as you may have planned  Please go to the LAB in the Basement (turn left off the elevator) for the tests to be done today  You will be contacted by phone if any changes need to be made immediately.  Otherwise, you will receive a letter about your results with an explanation, but please check with MyChart first.  Please remember to sign up for MyChart if you have not done so, as this will be important to you in the future with finding out test results, communicating by private email, and scheduling acute appointments online when needed.

## 2016-05-24 NOTE — Progress Notes (Signed)
Pre visit review using our clinic review tool, if applicable. No additional management support is needed unless otherwise documented below in the visit note. 

## 2016-05-24 NOTE — Progress Notes (Signed)
Subjective:    Patient ID: Audrey Russell, female    DOB: [birth year], 61 y.o.   MRN: [id number]  HPI  Here with acute onset nausea and diarrhea loose stools watery without blood or vomiting but with occasional crampy abd pain, has felt some warm with several chills but no high fevers, seemed to start after eating meatloaf her daughter made.  Has reduced appetite but able to keep down liquids.  Feels some bloated, HA, diarrhea about 2-3 times per day that does seem particularly foul smelling to her.  No prior hx of c diff or known exposure Past Medical History:  Diagnosis Date  . Arthritis   . Bipolar disorder ([place])   . Chickenpox   . Depression   . Diabetes mellitus without complication ([place])   . Fibromyalgia   . Hyperlipidemia   . Hypertension   . Multiple sclerosis ([place])    Past Surgical History:  Procedure Laterality Date  . ABDOMINAL HYSTERECTOMY  1997&1998   partial  . CHOLECYSTECTOMY  1997  . FACIAL RECONSTRUCTION SURGERY      reports that she has quit smoking. She has never used smokeless tobacco. She reports that she does not drink alcohol or use drugs. family history includes Arthritis in her maternal grandmother and mother; Diabetes in her father and paternal grandmother; Epilepsy in her mother; Heart disease in her father and mother; Hypertension in her father and mother; Stroke in her mother. No Known Allergies Current Outpatient Prescriptions on File Prior to Visit  Medication Sig Dispense Refill  . acetaminophen (TYLENOL) 500 MG tablet Take 1,000 mg by mouth every 6 (six) hours as needed for mild pain or moderate pain.    . citalopram (CELEXA) 40 MG tablet Take 40 mg by mouth at bedtime.     . clonazePAM (KLONOPIN) 2 MG tablet Take 2-4 mg by mouth at bedtime.    Marland Kitchen ezetimibe (ZETIA) 10 MG tablet Take 10 mg by mouth every evening.    Marland Kitchen ibuprofen (ADVIL,MOTRIN) 200 MG tablet Take 200-400 mg by mouth every 6 (six) hours as needed for mild pain or moderate pain.    Marland Kitchen  JANUMET XR [phone] MG TB24 Take 1 tablet by mouth at bedtime.    . lamoTRIgine (LAMICTAL) 25 MG tablet Take 50 mg by mouth 2 (two) times daily.     Marland Kitchen lisinopril (PRINIVIL,ZESTRIL) 10 MG tablet Take 10 mg by mouth daily.  2  . mirtazapine (REMERON) 30 MG tablet Take 30 mg by mouth at bedtime.    . ranitidine (ZANTAC) 300 MG tablet Take 300 mg by mouth at bedtime.    . rosuvastatin (CRESTOR) 40 MG tablet Take 40 mg by mouth daily.  4   No current facility-administered medications on file prior to visit.    Review of Systems  Constitutional: Negative for other unusual diaphoresis or sweats HENT: Negative for ear discharge or swelling Eyes: Negative for other worsening visual disturbances Respiratory: Negative for stridor or other swelling  Gastrointestinal: Negative for worsening distension or other blood Genitourinary: Negative for retention or other urinary change Musculoskeletal: Negative for other MSK pain or swelling Skin: Negative for color change or other new lesions Neurological: Negative for worsening tremors and other numbness  Psychiatric/Behavioral: Negative for worsening agitation or other fatigue All other system neg per pt    Objective:   Physical Exam BP 100/64   Pulse 100   Ht '5\' 8"'$  (1.727 m)   Wt 168 lb (76.2 kg)   SpO2 96%  BMI 25.54 kg/m  VS noted, mild ill appaering Constitutional: Pt appears in NAD HENT: Head: NCAT.  Right Ear: External ear normal.  Left Ear: External ear normal.  Eyes: . Pupils are equal, round, and reactive to light. Conjunctivae and EOM are normal Bilat tm's with mild erythema.  Max sinus areas non tender.  Pharynx with mild erythema, no exudate Nose: without d/c or deformity Neck: Neck supple. Gross normal ROM Cardiovascular: Normal rate and regular rhythm.   Pulmonary/Chest: Effort normal and breath sounds without rales or wheezing.  Abd:  Soft, ND, + BS, no organomegaly, with mild diffuse tender without guarding or  rebound Neurological: Pt is alert. At baseline orientation, motor grossly intact Skin: Skin is warm. No rashes, other new lesions, no LE edema Psychiatric: Pt behavior is normal without agitation  No other exam findings    Assessment & Plan:

## 2016-05-25 ENCOUNTER — Encounter: Payer: Self-pay | Admitting: Internal Medicine

## 2016-05-25 DIAGNOSIS — R109 Unspecified abdominal pain: Secondary | ICD-10-CM | POA: Insufficient documentation

## 2016-05-25 DIAGNOSIS — R197 Diarrhea, unspecified: Secondary | ICD-10-CM | POA: Insufficient documentation

## 2016-05-25 HISTORY — DX: Unspecified abdominal pain: R10.9

## 2016-05-25 NOTE — Assessment & Plan Note (Signed)
Low normal today, asympt except some weakness abd fatigue, stable overall by history and exam, recent data reviewed with pt, and pt to continue medical treatment as before,  to f/u any worsening symptoms or concerns BP Readings from Last 3 Encounters:  05/24/16 100/64  04/04/16 118/70  07/13/15 135/84

## 2016-05-25 NOTE — Assessment & Plan Note (Signed)
No hx of c diff but cant r/0,. For stool cx, c dif toxin study, empiric flagyl

## 2016-05-25 NOTE — Assessment & Plan Note (Signed)
Mild likley related, for labs as ordered,  to f/u any worsening symptoms or concerns

## 2016-05-27 ENCOUNTER — Telehealth: Payer: Self-pay | Admitting: Nurse Practitioner

## 2016-05-27 ENCOUNTER — Ambulatory Visit (INDEPENDENT_AMBULATORY_CARE_PROVIDER_SITE_OTHER): Payer: PPO | Admitting: Neurology

## 2016-05-27 ENCOUNTER — Encounter: Payer: Self-pay | Admitting: Neurology

## 2016-05-27 ENCOUNTER — Encounter (INDEPENDENT_AMBULATORY_CARE_PROVIDER_SITE_OTHER): Payer: Self-pay

## 2016-05-27 VITALS — BP 97/72 | HR 86 | Ht 68.0 in | Wt 175.4 lb

## 2016-05-27 DIAGNOSIS — G35 Multiple sclerosis: Secondary | ICD-10-CM | POA: Diagnosis not present

## 2016-05-27 DIAGNOSIS — R51 Headache: Secondary | ICD-10-CM

## 2016-05-27 DIAGNOSIS — H53139 Sudden visual loss, unspecified eye: Secondary | ICD-10-CM

## 2016-05-27 DIAGNOSIS — R531 Weakness: Secondary | ICD-10-CM | POA: Diagnosis not present

## 2016-05-27 DIAGNOSIS — R519 Headache, unspecified: Secondary | ICD-10-CM

## 2016-05-27 NOTE — Telephone Encounter (Signed)
Received PA request from [facility] for Ondansetron HCL.   PA stared, waiting for response  Key: [id number]

## 2016-05-27 NOTE — Patient Instructions (Addendum)
Remember to drink plenty of fluid, eat healthy meals and do not skip any meals. Try to eat protein with a every meal and eat a healthy snack such as fruit or nuts in between meals. Try to keep a regular sleep-wake schedule and try to exercise daily, particularly in the form of walking, 20-30 minutes a day, if you can.   As far as diagnostic testing: MRI brain and cervical spine.  Our phone number is [phone]. We also have an after hours call service for urgent matters and there is a physician on-call for urgent questions. For any emergencies you know to call 911 or go to the nearest emergency room
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
## 2016-05-28 ENCOUNTER — Other Ambulatory Visit: Payer: PPO

## 2016-05-28 ENCOUNTER — Telehealth: Payer: Self-pay

## 2016-05-28 DIAGNOSIS — R197 Diarrhea, unspecified: Secondary | ICD-10-CM | POA: Diagnosis not present

## 2016-05-28 MED ORDER — PROMETHAZINE HCL 25 MG PO TABS: 25.0000 mg | ORAL_TABLET | Freq: Four times a day (QID) | ORAL | 0 refills | Status: DC | PRN

## 2016-05-28 NOTE — Telephone Encounter (Signed)
[place] for change to phenergan prn - done erx

## 2016-05-28 NOTE — Telephone Encounter (Signed)
Received PA response from cover my med stating PA for Ondansetron is denied. Please advise what else can she take Dr. Jenny Reichmann? Pt came in and saw you on 05/24/2016.

## 2016-05-28 NOTE — Addendum Note (Signed)
Addended by: Biagio Borg on: 05/28/2016 06:58 PM   Modules accepted: Orders

## 2016-05-28 NOTE — Telephone Encounter (Signed)
Lab needed test reordered with [facility] as the resulting agency.

## 2016-05-30 ENCOUNTER — Encounter: Payer: Self-pay | Admitting: Internal Medicine

## 2016-05-30 LAB — CLOSTRIDIUM DIFFICILE BY PCR: CDIFFPCR: NOT DETECTED

## 2016-06-01 LAB — STOOL CULTURE

## 2016-06-18 ENCOUNTER — Ambulatory Visit
Admission: RE | Admit: 2016-06-18 | Discharge: 2016-06-18 | Disposition: A | Discharge status: Home / Self Care | Payer: PPO | Source: Ambulatory Visit | Attending: Neurology | Admitting: Neurology

## 2016-06-18 DIAGNOSIS — G35 Multiple sclerosis: Secondary | ICD-10-CM

## 2016-06-18 DIAGNOSIS — H53139 Sudden visual loss, unspecified eye: Secondary | ICD-10-CM

## 2016-06-18 DIAGNOSIS — R531 Weakness: Secondary | ICD-10-CM

## 2016-06-18 DIAGNOSIS — G8929 Other chronic pain: Secondary | ICD-10-CM

## 2016-06-18 DIAGNOSIS — R51 Headache: Secondary | ICD-10-CM

## 2016-06-18 MED ORDER — GADOBENATE DIMEGLUMINE 529 MG/ML IV SOLN: 15.0000 mL | Freq: Once | INTRAVENOUS | Status: DC | PRN

## 2016-06-25 ENCOUNTER — Other Ambulatory Visit: Payer: Self-pay | Admitting: *Deleted

## 2016-06-25 MED ORDER — EZETIMIBE 10 MG PO TABS: 10.0000 mg | ORAL_TABLET | Freq: Every evening | ORAL | 0 refills | Status: DC

## 2016-06-25 MED ORDER — ROSUVASTATIN CALCIUM 40 MG PO TABS: 40.0000 mg | ORAL_TABLET | Freq: Every day | ORAL | 0 refills | Status: DC

## 2016-07-30 ENCOUNTER — Telehealth: Payer: Self-pay | Admitting: Nurse Practitioner

## 2016-07-30 DIAGNOSIS — F339 Major depressive disorder, recurrent, unspecified: Secondary | ICD-10-CM

## 2016-07-30 DIAGNOSIS — F319 Bipolar disorder, unspecified: Secondary | ICD-10-CM

## 2016-07-30 MED ORDER — MIRTAZAPINE 30 MG PO TABS: 30.0000 mg | ORAL_TABLET | Freq: Every day | ORAL | 0 refills | Status: DC

## 2016-07-30 NOTE — Telephone Encounter (Signed)
Pt is aware. appt made with [name] on 08/23/2016 to speak about this med--she doesn't have psych doctor (her last PCP gave her these meds).

## 2016-07-30 NOTE — Telephone Encounter (Signed)
Please have patient request psych medications (remeron, lamictal, klonopin and celexa) from her psychiatrist.

## 2016-07-30 NOTE — Addendum Note (Signed)
Addended byShawnie Pons on: 07/30/2016 05:16 PM   Modules accepted: Orders

## 2016-07-30 NOTE — Telephone Encounter (Signed)
Pt needs refill on Mirtazapine   Pharmacy [facility] [address]

## 2016-07-30 NOTE — Telephone Encounter (Signed)
Please advise, we never send in this med for pt.

## 2016-07-30 NOTE — Telephone Encounter (Signed)
Pt is aware, still wants to keep an appt in August.

## 2016-07-30 NOTE — Telephone Encounter (Signed)
I am unable to manage this medication, hence she will need to follow up with psychiatry. Please enter referral to [facility] and/or [facility]. Thank you

## 2016-08-23 ENCOUNTER — Ambulatory Visit (INDEPENDENT_AMBULATORY_CARE_PROVIDER_SITE_OTHER): Payer: PPO | Admitting: Nurse Practitioner

## 2016-08-23 ENCOUNTER — Other Ambulatory Visit (INDEPENDENT_AMBULATORY_CARE_PROVIDER_SITE_OTHER): Payer: PPO

## 2016-08-23 ENCOUNTER — Telehealth: Payer: Self-pay | Admitting: Nurse Practitioner

## 2016-08-23 ENCOUNTER — Encounter: Payer: Self-pay | Admitting: Nurse Practitioner

## 2016-08-23 VITALS — BP 122/70 | HR 90 | Temp 99.0°F | Ht 68.0 in | Wt 169.0 lb

## 2016-08-23 DIAGNOSIS — E782 Mixed hyperlipidemia: Secondary | ICD-10-CM

## 2016-08-23 DIAGNOSIS — Z1329 Encounter for screening for other suspected endocrine disorder: Secondary | ICD-10-CM

## 2016-08-23 DIAGNOSIS — I10 Essential (primary) hypertension: Secondary | ICD-10-CM

## 2016-08-23 DIAGNOSIS — E118 Type 2 diabetes mellitus with unspecified complications: Secondary | ICD-10-CM | POA: Diagnosis not present

## 2016-08-23 DIAGNOSIS — F339 Major depressive disorder, recurrent, unspecified: Secondary | ICD-10-CM

## 2016-08-23 LAB — BASIC METABOLIC PANEL
BUN: 10 mg/dL (ref 6–23)
CHLORIDE: 104 meq/L (ref 96–112)
CO2: 26 meq/L (ref 19–32)
Calcium: 9.9 mg/dL (ref 8.4–10.5)
Creatinine, Ser: 0.9 mg/dL (ref 0.40–1.20)
GFR: 81.96 mL/min (ref >60.00)
Glucose, Bld: 102 mg/dL — ABNORMAL HIGH (ref 70–99)
POTASSIUM: 3.8 meq/L (ref 3.5–5.1)
SODIUM: 138 meq/L (ref 135–145)

## 2016-08-23 LAB — HEPATIC FUNCTION PANEL
ALK PHOS: 92 U/L (ref 39–117)
ALT: 11 U/L (ref 0–35)
AST: 14 U/L (ref 0–37)
Albumin: 4.5 g/dL (ref 3.5–5.2)
BILIRUBIN DIRECT: 0 mg/dL (ref 0.0–0.3)
TOTAL PROTEIN: 8.3 g/dL (ref 6.0–8.3)
Total Bilirubin: 0.5 mg/dL (ref 0.2–1.2)

## 2016-08-23 LAB — LIPID PANEL
CHOL/HDL RATIO: 8
Cholesterol: 255 mg/dL — ABNORMAL HIGH (ref 0–200)
HDL: 30.4 mg/dL — ABNORMAL LOW (ref >39.00)
Triglycerides: 564 mg/dL — ABNORMAL HIGH (ref 0.0–149.0)

## 2016-08-23 LAB — LDL CHOLESTEROL, DIRECT: Direct LDL: 82 mg/dL

## 2016-08-23 LAB — HEMOGLOBIN A1C: Hgb A1c MFr Bld: 6.9 % — ABNORMAL HIGH (ref 4.6–6.5)

## 2016-08-23 LAB — TSH: TSH: 1.88 u[IU]/mL (ref 0.35–4.50)

## 2016-08-23 MED ORDER — CITALOPRAM HYDROBROMIDE 40 MG PO TABS: 40.0000 mg | ORAL_TABLET | Freq: Every day | ORAL | 1 refills | Status: DC

## 2016-08-23 MED ORDER — MIRTAZAPINE 30 MG PO TABS: 30.0000 mg | ORAL_TABLET | Freq: Every day | ORAL | 2 refills | Status: DC

## 2016-08-23 NOTE — Patient Instructions (Signed)
Go to basement for blood draw. You will be called with results.

## 2016-08-23 NOTE — Progress Notes (Signed)
Subjective:  Patient ID: Audrey Russell, female    DOB: [birth year]  Age: 60 y.o. MRN: [id number]  CC: Follow-up (med consult/med refills/ see neurology on 08/28/2016)   HPI Depression: Stable mood. Has appt with Psychiatry: Dr Viviana Simpler on 10/09/2016.  Leg weakness and MS: Managed by neurology F/up appt 08/28/2016.  DXI:PJASNK with lisinopril BP Readings from Last 3 Encounters:  08/23/16 122/70  05/27/16 97/72  05/24/16 100/64   DM: Does not check glucose at home. Current use of janumet.  Outpatient Medications Prior to Visit  Medication Sig Dispense Refill  . acetaminophen (TYLENOL) 500 MG tablet Take 1,000 mg by mouth every 6 (six) hours as needed for mild pain or moderate pain.    . clonazePAM (KLONOPIN) 2 MG tablet Take 2-4 mg by mouth at bedtime.    . lamoTRIgine (LAMICTAL) 25 MG tablet Take 50 mg by mouth 2 (two) times daily.     . promethazine (PHENERGAN) 25 MG tablet Take 1 tablet (25 mg total) by mouth every 6 (six) hours as needed for nausea or vomiting. 30 tablet 0  . ranitidine (ZANTAC) 300 MG tablet Take 300 mg by mouth at bedtime.    . citalopram (CELEXA) 40 MG tablet Take 40 mg by mouth at bedtime.     Marland Kitchen ezetimibe (ZETIA) 10 MG tablet Take 1 tablet (10 mg total) by mouth every evening. 90 tablet 0  . JANUMET XR [phone] MG TB24 Take 1 tablet by mouth at bedtime.    Marland Kitchen lisinopril (PRINIVIL,ZESTRIL) 10 MG tablet Take 10 mg by mouth daily.  2  . rosuvastatin (CRESTOR) 40 MG tablet Take 1 tablet (40 mg total) by mouth daily. 90 tablet 0  . mirtazapine (REMERON) 30 MG tablet Take 1 tablet (30 mg total) by mouth at bedtime. (Patient not taking: Reported on 08/23/2016) 30 tablet 0  . ondansetron (ZOFRAN) 4 MG tablet Take 1 tablet (4 mg total) by mouth every 8 (eight) hours as needed for nausea or vomiting. (Patient not taking: Reported on 08/23/2016) 2030 tablet 1   No facility-administered medications prior to visit.     ROS See HPI  Objective:  BP 122/70   Pulse 90    Temp 99 F (37.2 C)   Ht 5\' 8"  (1.727 m)   Wt 169 lb (76.7 kg)   SpO2 98%   BMI 25.70 kg/m   BP Readings from Last 3 Encounters:  08/23/16 122/70  05/27/16 97/72  05/24/16 100/64    Wt Readings from Last 3 Encounters:  08/23/16 169 lb (76.7 kg)  05/27/16 175 lb 6.4 oz (79.6 kg)  05/24/16 168 lb (76.2 kg)    Physical Exam  Constitutional: She is oriented to person, place, and time. No distress.  Cardiovascular: Normal rate, regular rhythm and normal heart sounds.   Pulmonary/Chest: Effort normal and breath sounds normal.  Musculoskeletal: She exhibits no edema.  Neurological: She is alert and oriented to person, place, and time.  Skin: Skin is warm and dry.  Vitals reviewed.   Lab Results  Component Value Date   WBC 12.0 (H) 05/24/2016   HGB 14.6 05/24/2016   HCT 43.8 05/24/2016   PLT 252.0 05/24/2016   GLUCOSE 102 (H) 08/23/2016   CHOL 255 (H) 08/23/2016   TRIG (H) 08/23/2016    564.0 Triglyceride is over 400; calculations on Lipids are invalid.   HDL 30.40 (L) 08/23/2016   LDLDIRECT 82.0 08/23/2016   ALT 11 08/23/2016   AST 14 08/23/2016   NA 138 08/23/2016  K 3.8 08/23/2016   CL 104 08/23/2016   CREATININE 0.90 08/23/2016   BUN 10 08/23/2016   CO2 26 08/23/2016   TSH 1.88 08/23/2016   HGBA1C 6.9 (H) 08/23/2016    Mr Brain W [place] Contrast  Result Date: 06/19/2016 [facility] NEUROIMAGING REPORT STUDY DATE: 06/18/16 PATIENT NAME: Audrey Russell DOB: 20-Nov-1955 MRN: [id number] ORDERING CLINICIAN: Sarina Ill, MD CLINICAL HISTORY: 61 year old female with multiple sclerosis. EXAM: MRI brain (with and without) TECHNIQUE: MRI of the brain with and without contrast was obtained utilizing 5 mm axial slices with T1, T2, T2 flair, SWI and diffusion weighted views.  T1 sagittal, T2 coronal and postcontrast views in the axial and coronal plane were obtained. CONTRAST: 31ml multihance COMPARISON: none IMAGING SITE: [facility] [address] (1.5 Tesla MRI)  FINDINGS: Metallic artifact noted over the left greater than right frontal and maxillary regions. This partially obscures some regions of brain parenchyma. No abnormal lesions are seen on diffusion-weighted views to suggest acute ischemia. The cortical sulci, fissures and cisterns are normal in size and appearance. Lateral, third and fourth ventricle are normal in size and appearance. No extra-axial fluid collections are seen. No evidence of mass effect or midline shift.  No abnormal lesions are seen on post contrast views.  Mild scattered periventricular and subcortical foci of nonspecific T2 hyperintensities. On sagittal views the posterior fossa, pituitary gland and corpus callosum are unremarkable. No evidence of intracranial hemorrhage on SWI views. The orbits and their contents, paranasal sinuses and calvarium are unremarkable.  Intracranial flow voids are present.   Abnormal MRI brain (with and without) demonstrating: 1. Mild scattered periventricular and subcortical foci of nonspecific T2 hyperintensities. Considerations include autoimmune, inflammatory, post-infectious or microvascular ischemic etiologies. 2. Metallic artifact noted over the left greater than right frontal and maxillary regions. This partially obscures some regions of brain parenchyma. 3. No abnormal enhancing lesions. No acute findings. INTERPRETING PHYSICIAN: Penni Bombard, MD Certified in Neurology, Neurophysiology and Neuroimaging [facility] [address] [place], [place] [address] [phone]   Mr Cervical Spine W [place] Contrast  Result Date: 06/19/2016 [facility] NEUROIMAGING REPORT STUDY DATE: 06/18/16 PATIENT NAME: Audrey Russell DOB: 1955/10/30 MRN: [id number] ORDERING CLINICIAN: Sarina Ill, MD CLINICAL HISTORY: 61 year old female with multiple sclerosis. EXAM: MRI cervical spine (with and without) TECHNIQUE: MRI of the cervical spine was obtained  utilizing 3 mm sagittal slices from the posterior fossa down to the T3-4 level with T1, T2 and inversion recovery views. In addition 4 mm axial slices from P2-3 down to T1-2 level were included with T2 and gradient echo views. CONTRAST: 22ml magnevist COMPARISON: none IMAGING SITE: [facility] [address] (1.5 Tesla MRI)  FINDINGS: On sagittal views the vertebral bodies have normal height and alignment.  The spinal cord is normal in size and appearance. The posterior fossa, pituitary gland and paraspinal soft tissues are unremarkable.  On axial views C2-3: no spinal stenosis or foraminal narrowing C3-4: no spinal stenosis or foraminal narrowing C4-5: disc bulging and facet hypertrophy with mild right and moderate left foraminal stenosis C5-6: disc bulging with moderate left foraminal stenosis C6-7: no spinal stenosis or foraminal narrowing C7-T1: no spinal stenosis or foraminal narrowing Limited views of the soft tissues of the head and neck are unremarkable. No abnormal enhancing lesions.   Abnormal MRI cervical spine (with and without) demonstrating: 1. At C4-5: disc bulging and facet hypertrophy with mild right and moderate left  foraminal stenosis 2. At C5-6: disc bulging with moderate left foraminal stenosis 3. No abnormal enhancing lesions. INTERPRETING PHYSICIAN: Penni Bombard, MD Certified in Neurology, Neurophysiology and Neuroimaging [facility] [address] [place], [place] [address] [phone]    Assessment & Plan:   Rumaysa was seen today for follow-up.  Diagnoses and all orders for this visit:  HTN (hypertension), benign -     Basic metabolic panel; Future -     lisinopril (PRINIVIL,ZESTRIL) 10 MG tablet; Take 1 tablet (10 mg total) by mouth daily.  Mixed hyperlipidemia -     Lipid panel; Future -     Hepatic function panel; Future -     omega-3 acid ethyl esters (LOVAZA) 1 g capsule; Take 2 capsules (2 g total) by mouth 2 (two)  times daily. -     ezetimibe (ZETIA) 10 MG tablet; Take 1 tablet (10 mg total) by mouth every evening. -     rosuvastatin (CRESTOR) 40 MG tablet; Take 1 tablet (40 mg total) by mouth daily. -     Ambulatory referral to Lipid Clinic  Thyroid disorder screen -     TSH; Future  Type 2 diabetes mellitus with complication, without long-term current use of insulin (HCC) -     Hemoglobin A1c; Future -     sitaGLIPtin-metformin (JANUMET) 50-500 MG tablet; Take 1 tablet by mouth 2 (two) times daily with a meal.  Episode of recurrent major depressive disorder, unspecified depression episode severity (HCC) -     citalopram (CELEXA) 40 MG tablet; Take 1 tablet (40 mg total) by mouth daily. -     mirtazapine (REMERON) 30 MG tablet; Take 1 tablet (30 mg total) by mouth at bedtime.   I have discontinued Ms. Ritacco's JANUMET XR and ondansetron. I have also changed her citalopram and lisinopril. Additionally, I am having her start on omega-3 acid ethyl esters and sitaGLIPtin-metformin. Lastly, I am having her maintain her clonazePAM, lamoTRIgine, ranitidine, acetaminophen, promethazine, mirtazapine, ezetimibe, and rosuvastatin.  Meds ordered this encounter  Medications  . citalopram (CELEXA) 40 MG tablet    Sig: Take 1 tablet (40 mg total) by mouth daily.    Dispense:  90 tablet    Refill:  1    Order Specific Question:   Supervising Provider    Answer:   Cassandria Anger [[id number]]  . mirtazapine (REMERON) 30 MG tablet    Sig: Take 1 tablet (30 mg total) by mouth at bedtime.    Dispense:  30 tablet    Refill:  2    Do not fill before 08/30/2016    Order Specific Question:   Supervising Provider    Answer:   Cassandria Anger [[id number]]  . omega-3 acid ethyl esters (LOVAZA) 1 g capsule    Sig: Take 2 capsules (2 g total) by mouth 2 (two) times daily.    Dispense:  120 capsule    Refill:  3    Order Specific Question:   Supervising Provider    Answer:   Cassandria Anger [[id number]]  .  ezetimibe (ZETIA) 10 MG tablet    Sig: Take 1 tablet (10 mg total) by mouth every evening.    Dispense:  90 tablet    Refill:  0    Order Specific Question:   Supervising Provider    Answer:   Cassandria Anger [[id number]]  . lisinopril (PRINIVIL,ZESTRIL) 10 MG tablet    Sig: Take 1 tablet (10 mg total) by mouth  daily.    Dispense:  90 tablet    Refill:  0    Order Specific Question:   Supervising Provider    Answer:   Cassandria Anger [[id number]]  . rosuvastatin (CRESTOR) 40 MG tablet    Sig: Take 1 tablet (40 mg total) by mouth daily.    Dispense:  90 tablet    Refill:  0    Order Specific Question:   Supervising Provider    Answer:   Cassandria Anger [[id number]]  . sitaGLIPtin-metformin (JANUMET) 50-500 MG tablet    Sig: Take 1 tablet by mouth 2 (two) times daily with a meal.    Dispense:  90 tablet    Refill:  0    Order Specific Question:   Supervising Provider    Answer:   Cassandria Anger [[id number]]    Follow-up: Return in about 3 months (around 11/23/2016) for DM and HTN, hyperlipidemia.  Wilfred Lacy, NP

## 2016-08-23 NOTE — Telephone Encounter (Signed)
Called Dr. Myra Rude Bland's office to check on the medical records that pt sign release since 3 wks ago.   Had to leave vm, medical record closed  [phone]

## 2016-08-25 MED ORDER — ROSUVASTATIN CALCIUM 40 MG PO TABS: 40.0000 mg | ORAL_TABLET | Freq: Every day | ORAL | 0 refills | Status: DC

## 2016-08-25 MED ORDER — OMEGA-3-ACID ETHYL ESTERS 1 G PO CAPS: 2.0000 g | ORAL_CAPSULE | Freq: Two times a day (BID) | ORAL | 3 refills | Status: DC

## 2016-08-25 MED ORDER — SITAGLIPTIN PHOS-METFORMIN HCL 50-500 MG PO TABS: 1.0000 | ORAL_TABLET | Freq: Two times a day (BID) | ORAL | 0 refills | Status: DC

## 2016-08-25 MED ORDER — EZETIMIBE 10 MG PO TABS: 10.0000 mg | ORAL_TABLET | Freq: Every evening | ORAL | 0 refills | Status: DC

## 2016-08-25 MED ORDER — LISINOPRIL 10 MG PO TABS: 10.0000 mg | ORAL_TABLET | Freq: Every day | ORAL | 0 refills | Status: DC

## 2016-08-26 NOTE — Telephone Encounter (Signed)
Spoke with Jeani Hawking, gave her our fax #. She will transfer the massage to med rec.

## 2016-08-28 ENCOUNTER — Ambulatory Visit: Payer: PPO | Admitting: Neurology

## 2016-09-09 NOTE — Telephone Encounter (Signed)
Tired to call the office again, can not leave vm or speak to anyone at all.

## 2016-09-11 ENCOUNTER — Telehealth: Payer: Self-pay | Admitting: Nurse Practitioner

## 2016-09-11 NOTE — Telephone Encounter (Signed)
Patient is requesting script for clonazepam.

## 2016-09-11 NOTE — Telephone Encounter (Signed)
Check [place] registry last filled 07/22/2016...Audrey Russell

## 2016-09-12 NOTE — Telephone Encounter (Signed)
Notified pt w/[name] response...Audrey Russell

## 2016-09-12 NOTE — Telephone Encounter (Signed)
No, she should get prescriptions from psychiatry

## 2016-09-26 ENCOUNTER — Other Ambulatory Visit: Payer: Self-pay | Admitting: Nurse Practitioner

## 2016-09-26 NOTE — Telephone Encounter (Signed)
Ok to send 30tabs. No refills

## 2016-09-26 NOTE — Telephone Encounter (Signed)
Pt stated she is currently on this med and last fill was 08/18/2016, she has 3 pill left and appt with psyciatry still 10/09/2016. Please advise.

## 2016-09-26 NOTE — Telephone Encounter (Signed)
Need to know if the pt is on this med (last time she has refill on this med?) or is she trying to resume this med( if so, she need to wait to talk to her psyciatry).

## 2016-09-26 NOTE — Telephone Encounter (Signed)
Needs to get prescription from psychiatry

## 2016-09-26 NOTE — Telephone Encounter (Signed)
Spoke with pt, she said she is taking it 2 tab daily at bedtime. Please advise, okey to change the sig (it is recommended to take 2 tab 2 times daily).

## 2016-09-26 NOTE — Telephone Encounter (Signed)
lamoTRIgine (LAMICTAL) 25 MG tablet   Patient is requesting a refill on this medication. Patient states [name] is aware she takes it. It is from her old PCP. Please advise. Thank you.   [id number] [place], [place] [phone] (Phone) [phone] (Fax)

## 2016-09-27 MED ORDER — LAMOTRIGINE 25 MG PO TABS: 50.0000 mg | ORAL_TABLET | Freq: Every day | ORAL | 0 refills | Status: DC

## 2016-10-01 NOTE — Progress Notes (Deleted)
Referring-Audrey Arnetha Courser, NP Reason for referral-Hyperlipidemia  HPI: 61 year old female for evaluation of hyperlipidemia at request of Lynnae January, NP. Lipid panel August 2018 showed total cholesterol 255, triglycerides 564, HDL 30, normal liver functions and normal TSH. Direct LDL 82.  Current Outpatient Prescriptions  Medication Sig Dispense Refill  . acetaminophen (TYLENOL) 500 MG tablet Take 1,000 mg by mouth every 6 (six) hours as needed for mild pain or moderate pain.    . citalopram (CELEXA) 40 MG tablet Take 1 tablet (40 mg total) by mouth daily. 90 tablet 1  . clonazePAM (KLONOPIN) 2 MG tablet Take 2-4 mg by mouth at bedtime.    Marland Kitchen ezetimibe (ZETIA) 10 MG tablet Take 1 tablet (10 mg total) by mouth every evening. 90 tablet 0  . lamoTRIgine (LAMICTAL) 25 MG tablet Take 2 tablets (50 mg total) by mouth daily. 30 tablet 0  . lisinopril (PRINIVIL,ZESTRIL) 10 MG tablet Take 1 tablet (10 mg total) by mouth daily. 90 tablet 0  . mirtazapine (REMERON) 30 MG tablet Take 1 tablet (30 mg total) by mouth at bedtime. 30 tablet 2  . omega-3 acid ethyl esters (LOVAZA) 1 g capsule Take 2 capsules (2 g total) by mouth 2 (two) times daily. 120 capsule 3  . promethazine (PHENERGAN) 25 MG tablet Take 1 tablet (25 mg total) by mouth every 6 (six) hours as needed for nausea or vomiting. 30 tablet 0  . ranitidine (ZANTAC) 300 MG tablet Take 300 mg by mouth at bedtime.    . rosuvastatin (CRESTOR) 40 MG tablet Take 1 tablet (40 mg total) by mouth daily. 90 tablet 0  . sitaGLIPtin-metformin (JANUMET) 50-500 MG tablet Take 1 tablet by mouth 2 (two) times daily with a meal. 90 tablet 0   No current facility-administered medications for this visit.     No Known Allergies  Past Medical History:  Diagnosis Date  . Anxiety   . Arthritis   . Bipolar disorder ([place])   . Chickenpox   . Depression   . Diabetes mellitus without complication ([place])   . Fibromyalgia   . Hyperlipidemia   .  Hypertension   . Multiple sclerosis ([place])     Past Surgical History:  Procedure Laterality Date  . ABDOMINAL HYSTERECTOMY  1997&1998   partial  . CHOLECYSTECTOMY  1997  . FACIAL RECONSTRUCTION SURGERY      Social History   Social History  . Marital status: Widowed    Spouse name: N/A  . Number of children: 2  . Years of education: 14   Occupational History  . Retired    Social History Main Topics  . Smoking status: Former Smoker    Types: Cigarettes    Quit date: 02/2016  . Smokeless tobacco: Never Used  . Alcohol use No  . Drug use: No  . Sexual activity: Not on file   Other Topics Concern  . Not on file   Social History Narrative   Lives w/ her daughter   Right-handed   Caffeine: once per day    Family History  Problem Relation Age of Onset  . Arthritis Mother   . Heart disease Mother   . Stroke Mother   . Hypertension Mother   . Epilepsy Mother   . Emphysema Mother   . Heart disease Father   . Hypertension Father   . Diabetes Father   . Arthritis Maternal Grandmother   . Diabetes Paternal Grandmother   . Autoimmune disease Neg Hx  ROS: no fevers or chills, productive cough, hemoptysis, dysphasia, odynophagia, melena, hematochezia, dysuria, hematuria, rash, seizure activity, orthopnea, PND, pedal edema, claudication. Remaining systems are negative.  Physical Exam:   There were no vitals taken for this visit.  General:  Well developed/well nourished in NAD Skin warm/dry Patient not depressed No peripheral clubbing Back-normal HEENT-normal/normal eyelids Neck supple/normal carotid upstroke bilaterally; no bruits; no JVD; no thyromegaly chest - CTA/ normal expansion CV - RRR/normal S1 and S2; no murmurs, rubs or gallops;  PMI nondisplaced Abdomen -NT/ND, no HSM, no mass, + bowel sounds, no bruit 2+ femoral pulses, no bruits Ext-no edema, chords, 2+ DP Neuro-grossly nonfocal  ECG - personally reviewed  A/P  1  Kirk Ruths, MD

## 2016-10-04 ENCOUNTER — Ambulatory Visit: Payer: PPO | Admitting: Cardiology

## 2016-10-04 ENCOUNTER — Ambulatory Visit: Payer: PPO | Admitting: Neurology

## 2016-10-09 ENCOUNTER — Encounter (HOSPITAL_COMMUNITY): Payer: Self-pay | Admitting: Psychiatry

## 2016-10-09 ENCOUNTER — Encounter (INDEPENDENT_AMBULATORY_CARE_PROVIDER_SITE_OTHER): Payer: Self-pay

## 2016-10-09 ENCOUNTER — Ambulatory Visit (INDEPENDENT_AMBULATORY_CARE_PROVIDER_SITE_OTHER): Payer: PPO | Admitting: Psychiatry

## 2016-10-09 VITALS — BP 132/78 | HR 87 | Ht 69.0 in | Wt 169.0 lb

## 2016-10-09 DIAGNOSIS — Z87891 Personal history of nicotine dependence: Secondary | ICD-10-CM

## 2016-10-09 DIAGNOSIS — G629 Polyneuropathy, unspecified: Secondary | ICD-10-CM

## 2016-10-09 DIAGNOSIS — G35 Multiple sclerosis: Secondary | ICD-10-CM

## 2016-10-09 DIAGNOSIS — M797 Fibromyalgia: Secondary | ICD-10-CM

## 2016-10-09 DIAGNOSIS — Z79899 Other long term (current) drug therapy: Secondary | ICD-10-CM | POA: Diagnosis not present

## 2016-10-09 DIAGNOSIS — F339 Major depressive disorder, recurrent, unspecified: Secondary | ICD-10-CM

## 2016-10-09 DIAGNOSIS — F3130 Bipolar disorder, current episode depressed, mild or moderate severity, unspecified: Secondary | ICD-10-CM | POA: Diagnosis not present

## 2016-10-09 DIAGNOSIS — G4700 Insomnia, unspecified: Secondary | ICD-10-CM

## 2016-10-09 MED ORDER — GABAPENTIN 100 MG PO CAPS: ORAL_CAPSULE | ORAL | 0 refills | Status: DC

## 2016-10-09 MED ORDER — LAMOTRIGINE 100 MG PO TABS: 100.0000 mg | ORAL_TABLET | Freq: Every day | ORAL | 0 refills | Status: DC
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
## 2016-10-15 ENCOUNTER — Encounter: Payer: Self-pay | Admitting: Neurology

## 2016-10-15 ENCOUNTER — Ambulatory Visit (INDEPENDENT_AMBULATORY_CARE_PROVIDER_SITE_OTHER): Payer: PPO | Admitting: Neurology

## 2016-10-15 VITALS — BP 104/72 | HR 81 | Ht 69.0 in | Wt 168.2 lb

## 2016-10-15 DIAGNOSIS — R9082 White matter disease, unspecified: Secondary | ICD-10-CM

## 2016-10-15 DIAGNOSIS — M5412 Radiculopathy, cervical region: Secondary | ICD-10-CM | POA: Diagnosis not present

## 2016-10-15 DIAGNOSIS — R930 Abnormal findings on diagnostic imaging of skull and head, not elsewhere classified: Secondary | ICD-10-CM

## 2016-10-15 DIAGNOSIS — G35 Multiple sclerosis: Secondary | ICD-10-CM | POA: Diagnosis not present

## 2016-10-15 DIAGNOSIS — R202 Paresthesia of skin: Secondary | ICD-10-CM

## 2016-10-15 DIAGNOSIS — H547 Unspecified visual loss: Secondary | ICD-10-CM

## 2016-10-15 NOTE — Patient Instructions (Signed)
Lumbar puncture Repeat MRI brain w/wo contrast Visual evoked potentials Epidural steroid injections in the cervical spine
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
## 2016-10-30 ENCOUNTER — Ambulatory Visit (INDEPENDENT_AMBULATORY_CARE_PROVIDER_SITE_OTHER): Payer: PPO | Admitting: Neurology

## 2016-10-30 DIAGNOSIS — R202 Paresthesia of skin: Secondary | ICD-10-CM | POA: Diagnosis not present

## 2016-10-30 DIAGNOSIS — G35 Multiple sclerosis: Secondary | ICD-10-CM | POA: Diagnosis not present

## 2016-10-30 DIAGNOSIS — H547 Unspecified visual loss: Secondary | ICD-10-CM | POA: Diagnosis not present

## 2016-10-30 NOTE — Procedures (Signed)
    History:   Audrey Russell is a 61 year old patient with a history of multiple sclerosis. The patient claims a prior history of blindness in the right eye, she has had episodes of visual loss in the left eye that will come and go. She is being evaluated for these symptoms.   Description: The visual evoked response test was performed today using 32 x 32 check sizes. The absolute latencies for the N1 and the P100 wave forms were within normal limits bilaterally. The amplitudes for the P100 wave forms were also within normal limits bilaterally. The visual acuity was 20/100 OD and 20/40 OS uncorrected.  Impression:  The visual evoked response test above was within normal limits bilaterally. No evidence of conduction slowing was seen within the anterior visual pathways on either side on today's evaluation.

## 2016-11-01 ENCOUNTER — Ambulatory Visit
Admission: RE | Admit: 2016-11-01 | Discharge: 2016-11-01 | Disposition: A | Discharge status: Home / Self Care | Payer: PPO | Source: Ambulatory Visit | Attending: Neurology | Admitting: Neurology

## 2016-11-01 DIAGNOSIS — G35 Multiple sclerosis: Secondary | ICD-10-CM

## 2016-11-01 DIAGNOSIS — R202 Paresthesia of skin: Secondary | ICD-10-CM

## 2016-11-01 DIAGNOSIS — H547 Unspecified visual loss: Secondary | ICD-10-CM | POA: Diagnosis not present

## 2016-11-01 LAB — TIQ-MISC

## 2016-11-01 MED ORDER — DIAZEPAM 5 MG PO TABS
10.0000 mg | ORAL_TABLET | Freq: Once | ORAL | Status: AC
  Administered 2016-11-01: 10 mg via ORAL

## 2016-11-01 NOTE — Discharge Instructions (Signed)

## 2016-11-01 NOTE — Progress Notes (Signed)
One SST tube of blood drawn from left [facility] space for LP labs; site unremarkable.  Brita Romp, RN

## 2016-11-04 ENCOUNTER — Telehealth: Payer: Self-pay | Admitting: Neurology

## 2016-11-04 ENCOUNTER — Other Ambulatory Visit: Payer: Self-pay

## 2016-11-04 ENCOUNTER — Telehealth: Payer: Self-pay | Admitting: *Deleted

## 2016-11-04 DIAGNOSIS — R519 Headache, unspecified: Secondary | ICD-10-CM | POA: Insufficient documentation

## 2016-11-04 DIAGNOSIS — G4489 Other headache syndrome: Secondary | ICD-10-CM

## 2016-11-04 DIAGNOSIS — R51 Headache: Secondary | ICD-10-CM

## 2016-11-04 NOTE — Telephone Encounter (Signed)
Pt states since her lumbar puncture she has had migraines, her eyes and head hurts all over please call.

## 2016-11-04 NOTE — Telephone Encounter (Signed)
Called patient, she verbalized understanding of visual evoked potentials test comments by Dr. Jaynee Eagles. She also had concern of head and neck pain since Saturday, had an LP on Friday 10/12. She denies any neurological changes other than pain and states it is the same whether she is lying or sitting. I told her I would d/w MD.

## 2016-11-04 NOTE — Telephone Encounter (Signed)
-----   Message from Melvenia Beam, MD sent at 11/03/2016 11:55 PM EDT ----- Visual evoked potentials are normal, the optic nerves are normal and no suggestion of optic neuritis or optic nerve problems currently or in the past.  thanks

## 2016-11-04 NOTE — Telephone Encounter (Signed)
Called patient, she reports headache all over and neck pain since Saturday, denies any neurological changes. Her LP was on Friday 10/12. She denies any changes in pain with position changes (lying vs sitting up). I told her I would f/u with MD.

## 2016-11-04 NOTE — Addendum Note (Signed)
Addended by: Rosalin Hawking on: 11/04/2016 02:56 PM   Modules accepted: Orders

## 2016-11-04 NOTE — Telephone Encounter (Signed)
Called pt back. And she stated that she had no history of migraine, no headache before Friday lumbar puncture. However after lumbar puncture, she was doing okay, more bed rest at home. Saturday morning she got up to go the bathroom and started to have mild headache, since then the headache getting worse on Saturday and no changes since then. She continued to have bed rest, drink plenty of water, but did not help too much with a headache. The headache did not change with positions, reporting same intensity of headache on sitting, standing or lying down. Headaches associate with photophobia and mild nausea, no phonophobia or vomiting. No vision changes.   Due to temporal relationship with a lumbar puncture, still thinking this is a post LP headache although not typical without positioning correlation. Asking patient to continue bedrest, drink plenty of water, and also caffeine containing fluids including coffee and Coke. I will also put in a blood patch request. If patient getting better for the next a couple of days, she can cancel the appointment. I also asked her to call back tomorrow for update symptoms. Patient expressed an standing and appreciation.  Rosalin Hawking, MD PhD Stroke Neurology 11/04/2016 2:27 PM  Orders Placed This Encounter  Procedures  . DG Epidurography    Standing Status:   Future    Standing Expiration Date:   01/04/2018    Order Specific Question:   Reason for Exam (SYMPTOM  OR DIAGNOSIS REQUIRED)    Answer:   post LP headache, request blood patch.    Order Specific Question:   Is the patient pregnant?    Answer:   No    Order Specific Question:   Preferred imaging location?    Answer:   [id number]

## 2016-11-05 ENCOUNTER — Other Ambulatory Visit (HOSPITAL_COMMUNITY): Payer: Self-pay | Admitting: Psychiatry

## 2016-11-05 ENCOUNTER — Ambulatory Visit (HOSPITAL_COMMUNITY): Payer: Self-pay | Admitting: Psychiatry

## 2016-11-05 ENCOUNTER — Ambulatory Visit
Admission: RE | Admit: 2016-11-05 | Discharge: 2016-11-05 | Disposition: A | Discharge status: Home / Self Care | Payer: PPO | Source: Ambulatory Visit | Attending: Neurology | Admitting: Neurology

## 2016-11-05 DIAGNOSIS — G4489 Other headache syndrome: Secondary | ICD-10-CM

## 2016-11-05 DIAGNOSIS — F339 Major depressive disorder, recurrent, unspecified: Secondary | ICD-10-CM

## 2016-11-05 DIAGNOSIS — G971 Other reaction to spinal and lumbar puncture: Secondary | ICD-10-CM | POA: Diagnosis not present

## 2016-11-05 MED ORDER — IOPAMIDOL (ISOVUE-M 200) INJECTION 41%: 1.0000 mL | Freq: Once | INTRAMUSCULAR | Status: DC

## 2016-11-05 MED ORDER — DIAZEPAM 5 MG PO TABS
10.0000 mg | ORAL_TABLET | Freq: Once | ORAL | Status: AC
  Administered 2016-11-05: 10 mg via ORAL

## 2016-11-05 NOTE — Telephone Encounter (Signed)
Patient called back and she relayed her head  is pounding now. I relayed to patient I would call her back I was calling  Audrey Russell at [facility]. Called Audrey Russell she is going to get patient in today for Blood Patch 11/05/2016 . Called patient back and spoke to her she is aware that Audrey Russell is going to be calling her soon to bring her in for blood patch.

## 2016-11-05 NOTE — Discharge Instructions (Signed)

## 2016-11-05 NOTE — Telephone Encounter (Signed)
Called and spoke to patient she relayed she feeling much much better she does not need blood patch . Called and spoke to [employer] and [employer] to [place] as well.  I relayed to Patient if she needs anything to call the office back. Patient understood.

## 2016-11-06 ENCOUNTER — Telehealth: Payer: Self-pay

## 2016-11-06 NOTE — Telephone Encounter (Signed)
FYI. Dr.Ahern.

## 2016-11-06 NOTE — Telephone Encounter (Signed)
Rn call [facility] about Dr.Ahern wanted to get results of oligoclonal bands,and CSF-igg. They stated the labs were collected and to call [facility] which is now [facility] in [place].   Rn call [facility] and spoke with customer service. They stated the oligoclonal bands labs take about 4 days to complete, it was sent to [place]. Also they do have the CSF IGG labs and it can be fax. Rn gave fax number of [phone]. Oligoclonal bands were taken on 11/01/2016.

## 2016-11-07 NOTE — Progress Notes (Deleted)
Referring-[name] Elsie Lincoln, NP Reason for referral-Hyperlipidemia  HPI: 61 yo female for evaluation of hyperlipidemia at request of Flossie Buffy, NP. Lipid panel August 2018 showed total cholesterol 255, triglycerides 564, HDL 30, direct LDL 82 and hemoglobin A1c 6.9. Liver functions normal. TSH normal.  Current Outpatient Prescriptions  Medication Sig Dispense Refill  . acetaminophen (TYLENOL) 500 MG tablet Take 1,000 mg by mouth every 6 (six) hours as needed for mild pain or moderate pain.    . citalopram (CELEXA) 40 MG tablet Take 1 tablet (40 mg total) by mouth daily. 90 tablet 1  . ezetimibe (ZETIA) 10 MG tablet Take 1 tablet (10 mg total) by mouth every evening. 90 tablet 0  . gabapentin (NEURONTIN) 100 MG capsule Take 1-3 capsule at bed time 90 capsule 0  . lamoTRIgine (LAMICTAL) 100 MG tablet Take 1 tablet (100 mg total) by mouth daily. 30 tablet 0  . lisinopril (PRINIVIL,ZESTRIL) 10 MG tablet Take 1 tablet (10 mg total) by mouth daily. 90 tablet 0  . mirtazapine (REMERON) 30 MG tablet Take 1 tablet (30 mg total) by mouth at bedtime. 30 tablet 2  . ranitidine (ZANTAC) 300 MG tablet Take 300 mg by mouth at bedtime.    . rosuvastatin (CRESTOR) 40 MG tablet Take 1 tablet (40 mg total) by mouth daily. 90 tablet 0  . sitaGLIPtin-metformin (JANUMET) 50-500 MG tablet Take 1 tablet by mouth 2 (two) times daily with a meal. 90 tablet 0   No current facility-administered medications for this visit.     No Known Allergies  Past Medical History:  Diagnosis Date  . Anxiety   . Arthritis   . Bipolar disorder ([place])   . Chickenpox   . Depression   . Diabetes mellitus without complication ([place])   . Fibromyalgia   . Hyperlipidemia   . Hypertension   . Multiple sclerosis ([place])     Past Surgical History:  Procedure Laterality Date  . ABDOMINAL HYSTERECTOMY  1997&1998   partial  . CHOLECYSTECTOMY  1997  . FACIAL RECONSTRUCTION SURGERY      Social History   Social  History  . Marital status: Widowed    Spouse name: N/A  . Number of children: 2  . Years of education: 14   Occupational History  . Retired    Social History Main Topics  . Smoking status: Former Smoker    Types: Cigarettes    Quit date: 02/2016  . Smokeless tobacco: Never Used  . Alcohol use No  . Drug use: No  . Sexual activity: No   Other Topics Concern  . Not on file   Social History Narrative   Lives w/ her daughter   Right-handed   Caffeine: once per day    Family History  Problem Relation Age of Onset  . Arthritis Mother   . Heart disease Mother   . Stroke Mother   . Hypertension Mother   . Epilepsy Mother   . Emphysema Mother   . Heart disease Father   . Hypertension Father   . Diabetes Father   . Arthritis Maternal Grandmother   . Diabetes Paternal Grandmother   . Autoimmune disease Neg Hx     ROS: no fevers or chills, productive cough, hemoptysis, dysphasia, odynophagia, melena, hematochezia, dysuria, hematuria, rash, seizure activity, orthopnea, PND, pedal edema, claudication. Remaining systems are negative.  Physical Exam:   There were no vitals taken for this visit.  General:  Well developed/well nourished in NAD Skin warm/dry  Patient not depressed No peripheral clubbing Back-normal HEENT-normal/normal eyelids Neck supple/normal carotid upstroke bilaterally; no bruits; no JVD; no thyromegaly chest - CTA/ normal expansion CV - RRR/normal S1 and S2; no murmurs, rubs or gallops;  PMI nondisplaced Abdomen -NT/ND, no HSM, no mass, + bowel sounds, no bruit 2+ femoral pulses, no bruits Ext-no edema, chords, 2+ DP Neuro-grossly nonfocal  ECG - personally reviewed  A/P  1  Kirk Ruths, MD

## 2016-11-11 ENCOUNTER — Other Ambulatory Visit (HOSPITAL_COMMUNITY): Payer: Self-pay

## 2016-11-11 DIAGNOSIS — F339 Major depressive disorder, recurrent, unspecified: Secondary | ICD-10-CM

## 2016-11-11 MED ORDER — GABAPENTIN 100 MG PO CAPS: ORAL_CAPSULE | ORAL | 0 refills | Status: DC

## 2016-11-11 MED ORDER — LAMOTRIGINE 100 MG PO TABS: 100.0000 mg | ORAL_TABLET | Freq: Every day | ORAL | 0 refills | Status: DC

## 2016-11-12 ENCOUNTER — Ambulatory Visit: Payer: PPO | Admitting: Cardiology

## 2016-11-12 ENCOUNTER — Telehealth: Payer: Self-pay | Admitting: Neurology

## 2016-11-12 NOTE — Telephone Encounter (Signed)
Spoke with Dr. Jaynee Eagles and informed her of the pt's report of fall/passing out. Called patient and advised her to go to the ED to rule out any bleeding in brain. She verbalized understanding. She is going to have her daughter drive her.

## 2016-11-12 NOTE — Telephone Encounter (Signed)
Patient's csf labs are normal. Her Evoked potentials were normal (optic nerves normal). So she does not have MS. Thanks.

## 2016-11-12 NOTE — Telephone Encounter (Signed)
Called and LVM asking for call back.    I will be discussing her CSF results.

## 2016-11-12 NOTE — Telephone Encounter (Signed)
Patient returned call, she verbalized understanding of her CSF results being normal and that she does not have MS. She also mentioned that last Saturday she noticed that she was dizzy and hot, then next thing she noticed she was on the floor and likely passed out. She stated that the room was spinning and it took her 15 minutes to get back up and go to bed. She also said that she had hit the back of her head and that it is still sore on the back and swollen and that she can't lie on her back because of the pain. The only symptom she has remaining other than pain is dizziness when she stands- she has to wait for a couple minutes before moving. She states that she has an MRI upcoming. I told her I would d/w MD and return her call as soon as possible.

## 2016-11-15 LAB — MYCOBACTERIUM TUBERCULOSIS COMPLEX: MTB Complex,PCR,Non-Resp.: NOT DETECTED

## 2016-11-15 LAB — MULTIPLE SCLEROSIS PANEL 2
ALBUMIN SERUM: 4.3 g/dL (ref 3.2–4.6)
ALBUMIN,CSF: 36.2 mg/dL (ref 8.0–42.0)
CNS-IgG Synthesis Rate: -4.9 mg/24 h (ref <=3.3)
IGG (IMMUNOGLOBIN G), SERUM: 1320 mg/dL (ref 694–1618)
IGG TOTAL CSF: 4.9 mg/dL (ref 0.8–7.7)
IgG-Index: 0.44 (ref <0.66)

## 2016-11-15 LAB — CSF CELL COUNT WITH DIFFERENTIAL
RBC COUNT CSF: 0 {cells}/uL (ref 0–10)
WBC, CSF: 4 cells/uL (ref 0–5)

## 2016-11-15 LAB — PROTEIN, CSF: Total Protein, CSF: 62 mg/dL — ABNORMAL HIGH (ref 15–45)

## 2016-11-15 LAB — ANGIOTENSIN CONVERTING ENZYME, CSF: ACE, CSF: 12 U/L (ref <=15)

## 2016-11-15 LAB — GLUCOSE, CSF: Glucose, CSF: 79 mg/dL (ref 40–80)

## 2016-11-15 LAB — VDRL, CSF: VDRL Quant, CSF: NONREACTIVE

## 2016-11-21 DIAGNOSIS — M50822 Other cervical disc disorders at C5-C6 level: Secondary | ICD-10-CM | POA: Diagnosis not present

## 2016-11-21 DIAGNOSIS — M50821 Other cervical disc disorders at C4-C5 level: Secondary | ICD-10-CM | POA: Diagnosis not present

## 2016-11-21 DIAGNOSIS — M542 Cervicalgia: Secondary | ICD-10-CM | POA: Diagnosis not present

## 2016-12-11 NOTE — Progress Notes (Deleted)
Referring-[name] [name] NP Reason for referral-Hyperlipidemia  HPI: 61 yo female for evaluation of hyperlipidemia at request of Wilfred Lacy NP.  Current Outpatient Medications  Medication Sig Dispense Refill  . acetaminophen (TYLENOL) 500 MG tablet Take 1,000 mg by mouth every 6 (six) hours as needed for mild pain or moderate pain.    . citalopram (CELEXA) 40 MG tablet Take 1 tablet (40 mg total) by mouth daily. 90 tablet 1  . ezetimibe (ZETIA) 10 MG tablet Take 1 tablet (10 mg total) by mouth every evening. 90 tablet 0  . gabapentin (NEURONTIN) 100 MG capsule Take 1-3 capsule at bed time 270 capsule 0  . lamoTRIgine (LAMICTAL) 100 MG tablet Take 1 tablet (100 mg total) by mouth daily. 90 tablet 0  . lisinopril (PRINIVIL,ZESTRIL) 10 MG tablet Take 1 tablet (10 mg total) by mouth daily. 90 tablet 0  . mirtazapine (REMERON) 30 MG tablet Take 1 tablet (30 mg total) by mouth at bedtime. 30 tablet 2  . ranitidine (ZANTAC) 300 MG tablet Take 300 mg by mouth at bedtime.    . rosuvastatin (CRESTOR) 40 MG tablet Take 1 tablet (40 mg total) by mouth daily. 90 tablet 0  . sitaGLIPtin-metformin (JANUMET) 50-500 MG tablet Take 1 tablet by mouth 2 (two) times daily with a meal. 90 tablet 0   No current facility-administered medications for this visit.     No Known Allergies  Past Medical History:  Diagnosis Date  . Anxiety   . Arthritis   . Bipolar disorder ([place])   . Chickenpox   . Depression   . Diabetes mellitus without complication ([place])   . Fibromyalgia   . Hyperlipidemia   . Hypertension   . Multiple sclerosis ([place])     Past Surgical History:  Procedure Laterality Date  . ABDOMINAL HYSTERECTOMY  1997&1998   partial  . CHOLECYSTECTOMY  1997  . FACIAL RECONSTRUCTION SURGERY      Social History   Socioeconomic History  . Marital status: Widowed    Spouse name: Not on file  . Number of children: 2  . Years of education: 59  . Highest education level: Not on file    Social Needs  . Financial resource strain: Not on file  . Food insecurity - worry: Not on file  . Food insecurity - inability: Not on file  . Transportation needs - medical: Not on file  . Transportation needs - non-medical: Not on file  Occupational History  . Occupation: Retired  Tobacco Use  . Smoking status: Former Smoker    Types: Cigarettes    Last attempt to quit: 02/2016    Years since quitting: 0.8  . Smokeless tobacco: Never Used  Substance and Sexual Activity  . Alcohol use: No  . Drug use: No  . Sexual activity: No  Other Topics Concern  . Not on file  Social History Narrative   Lives w/ her daughter   Right-handed   Caffeine: once per day    Family History  Problem Relation Age of Onset  . Arthritis Mother   . Heart disease Mother   . Stroke Mother   . Hypertension Mother   . Epilepsy Mother   . Emphysema Mother   . Heart disease Father   . Hypertension Father   . Diabetes Father   . Arthritis Maternal Grandmother   . Diabetes Paternal Grandmother   . Autoimmune disease Neg Hx     ROS: no fevers or chills, productive cough, hemoptysis, dysphasia,  odynophagia, melena, hematochezia, dysuria, hematuria, rash, seizure activity, orthopnea, PND, pedal edema, claudication. Remaining systems are negative.  Physical Exam:   There were no vitals taken for this visit.  General:  Well developed/well nourished in NAD Skin warm/dry Patient not depressed No peripheral clubbing Back-normal HEENT-normal/normal eyelids Neck supple/normal carotid upstroke bilaterally; no bruits; no JVD; no thyromegaly chest - CTA/ normal expansion CV - RRR/normal S1 and S2; no murmurs, rubs or gallops;  PMI nondisplaced Abdomen -NT/ND, no HSM, no mass, + bowel sounds, no bruit 2+ femoral pulses, no bruits Ext-no edema, chords, 2+ DP Neuro-grossly nonfocal  ECG - personally reviewed  A/P  1  Kirk Ruths, MD

## 2016-12-16 ENCOUNTER — Ambulatory Visit: Payer: Self-pay | Admitting: Nurse Practitioner

## 2016-12-19 ENCOUNTER — Ambulatory Visit: Payer: PPO | Admitting: Cardiology

## 2016-12-19 DIAGNOSIS — M542 Cervicalgia: Secondary | ICD-10-CM | POA: Diagnosis not present

## 2016-12-23 ENCOUNTER — Telehealth: Payer: Self-pay | Admitting: Nurse Practitioner

## 2016-12-23 DIAGNOSIS — E782 Mixed hyperlipidemia: Secondary | ICD-10-CM

## 2016-12-23 MED ORDER — ROSUVASTATIN CALCIUM 40 MG PO TABS: 40.0000 mg | ORAL_TABLET | Freq: Every day | ORAL | 0 refills | Status: DC

## 2016-12-23 NOTE — Telephone Encounter (Signed)
Ok to send zantac x 90days only

## 2016-12-23 NOTE — Telephone Encounter (Signed)
Copied from [place] [id number]. Topic: Quick Communication - See Telephone Encounter >> Dec 23, 2016 10:46 AM Boyd Kerbs wrote: CRM for notification. See Telephone encounter for:  Prescription  Xanax 300  and Rosuvastatin 40mg  needed. [facility] [address] [facility]/16.

## 2016-12-23 NOTE — Telephone Encounter (Signed)
Spoke with pt, rx for [facility]rest[facility] to [facility] days supply.   Pt also request refill for Zantac NOT Xanax. Please advise on this, do not we ever prescribed this med to the pt but sh said we sent in 30 days supply in 10/2016.   Pt has an appt with [name] 01/25/2016.

## 2016-12-23 NOTE — Telephone Encounter (Signed)
Pt requesting refill of Xanax and Rosuvastatin. Last OV 08/23/16.

## 2016-12-24 MED ORDER — RANITIDINE HCL 300 MG PO TABS: 300.0000 mg | ORAL_TABLET | Freq: Every day | ORAL | 0 refills | Status: DC

## 2016-12-24 NOTE — Telephone Encounter (Signed)
rx sent

## 2016-12-31 ENCOUNTER — Ambulatory Visit (HOSPITAL_COMMUNITY): Payer: Self-pay | Admitting: Psychiatry

## 2017-01-02 ENCOUNTER — Other Ambulatory Visit: Payer: Self-pay | Admitting: *Deleted

## 2017-01-02 ENCOUNTER — Telehealth: Payer: Self-pay | Admitting: Nurse Practitioner

## 2017-01-02 DIAGNOSIS — F339 Major depressive disorder, recurrent, unspecified: Secondary | ICD-10-CM

## 2017-01-02 MED ORDER — MIRTAZAPINE 30 MG PO TABS: 30.0000 mg | ORAL_TABLET | Freq: Every day | ORAL | 2 refills | Status: DC

## 2017-01-02 NOTE — Telephone Encounter (Addendum)
Copied from [place]. Topic: Quick Communication - See Telephone Encounter >> Jan 02, 2017 11:52 AM Ivar Drape wrote: CRM for notification. See Telephone encounter for:  01/02/17. Patient would like the following prescription refilled: mirtazapine (REMERON) 30 MG tablet .  [facility] [address].

## 2017-01-24 ENCOUNTER — Ambulatory Visit: Payer: PPO | Admitting: Nurse Practitioner

## 2017-01-28 ENCOUNTER — Ambulatory Visit (INDEPENDENT_AMBULATORY_CARE_PROVIDER_SITE_OTHER): Payer: PPO | Admitting: Nurse Practitioner

## 2017-01-28 ENCOUNTER — Encounter: Payer: Self-pay | Admitting: Nurse Practitioner

## 2017-01-28 VITALS — BP 122/76 | HR 90 | Temp 98.6°F | Ht 69.0 in | Wt 160.0 lb

## 2017-01-28 DIAGNOSIS — R197 Diarrhea, unspecified: Secondary | ICD-10-CM | POA: Diagnosis not present

## 2017-01-28 DIAGNOSIS — Z23 Encounter for immunization: Secondary | ICD-10-CM | POA: Diagnosis not present

## 2017-01-28 DIAGNOSIS — E781 Pure hyperglyceridemia: Secondary | ICD-10-CM

## 2017-01-28 DIAGNOSIS — I10 Essential (primary) hypertension: Secondary | ICD-10-CM | POA: Diagnosis not present

## 2017-01-28 DIAGNOSIS — E118 Type 2 diabetes mellitus with unspecified complications: Secondary | ICD-10-CM

## 2017-01-28 DIAGNOSIS — H6993 Unspecified Eustachian tube disorder, bilateral: Secondary | ICD-10-CM | POA: Diagnosis not present

## 2017-01-28 DIAGNOSIS — Z1159 Encounter for screening for other viral diseases: Secondary | ICD-10-CM | POA: Diagnosis not present

## 2017-01-28 DIAGNOSIS — F339 Major depressive disorder, recurrent, unspecified: Secondary | ICD-10-CM | POA: Diagnosis not present

## 2017-01-28 DIAGNOSIS — R1084 Generalized abdominal pain: Secondary | ICD-10-CM

## 2017-01-28 DIAGNOSIS — E782 Mixed hyperlipidemia: Secondary | ICD-10-CM | POA: Diagnosis not present

## 2017-01-28 MED ORDER — FLUTICASONE PROPIONATE 50 MCG/ACT NA SUSP: 2.0000 | Freq: Every day | NASAL | 0 refills | Status: DC

## 2017-01-28 MED ORDER — CETIRIZINE HCL 10 MG PO TABS: 10.0000 mg | ORAL_TABLET | Freq: Every day | ORAL | 0 refills | Status: DC

## 2017-01-28 NOTE — Patient Instructions (Addendum)
I will inquire about next OV with Dr. Milta Deiters.  Negative hep C. Mild elevation in glucose due to diabetes. DM is controlled with hgb A1c at 6.9. Normal urine microalbumin. Normal liver and renal function. Lipid panel indicates very elevated triglyceride level. You need to go to lipid clinic. Discontinue crestor, start fenofibrate. Continue zetia. Entered another referral. Need f/up in 56months (fasting). You wil be called to schedule appt with GI.  Please have the ophthalmology send copy of recent eye exam.   Food Choices to Help Relieve Diarrhea, Adult When you have diarrhea, the foods you eat and your eating habits are very important. Choosing the right foods and drinks can help:  Relieve diarrhea.  Replace lost fluids and nutrients.  Prevent dehydration.  What general guidelines should I follow? Relieving diarrhea  Choose foods with less than 2 g or .07 oz. of fiber per serving.  Limit fats to less than 8 tsp (38 g or 1.34 oz.) a day.  Avoid the following: ? Foods and beverages sweetened with high-fructose corn syrup, honey, or sugar alcohols such as xylitol, sorbitol, and mannitol. ? Foods that contain a lot of fat or sugar. ? Fried, greasy, or spicy foods. ? High-fiber grains, breads, and cereals. ? Raw fruits and vegetables.  Eat foods that are rich in probiotics. These foods include dairy products such as yogurt and fermented milk products. They help increase healthy bacteria in the stomach and intestines (gastrointestinal tract, or GI tract).  If you have lactose intolerance, avoid dairy products. These may make your diarrhea worse.  Take medicine to help stop diarrhea (antidiarrheal medicine) only as told by your health care provider. Replacing nutrients  Eat small meals or snacks every 3-4 hours.  Eat bland foods, such as white rice, toast, or baked potato, until your diarrhea starts to get better. Gradually reintroduce nutrient-rich foods as tolerated or as told by  your health care provider. This includes: ? Well-cooked protein foods. ? Peeled, seeded, and soft-cooked fruits and vegetables. ? Low-fat dairy products.  Take vitamin and mineral supplements as told by your health care provider. Preventing dehydration   Start by sipping water or a special solution to prevent dehydration (oral rehydration solution, ORS). Urine that is clear or pale yellow means that you are getting enough fluid.  Try to drink at least 8-10 cups of fluid each day to help replace lost fluids.  You may add other liquids in addition to water, such as clear juice or decaffeinated sports drinks, as tolerated or as told by your health care provider.  Avoid drinks with caffeine, such as coffee, tea, or soft drinks.  Avoid alcohol. What foods are recommended? The items listed may not be a complete list. Talk with your health care provider about what dietary choices are best for you. Grains White rice. White, [place], or pita breads (fresh or toasted), including plain rolls, buns, or bagels. White pasta. Saltine, soda, or graham crackers. Pretzels. Low-fiber cereal. Cooked cereals made with water (such as cornmeal, farina, or cream cereals). Plain muffins. Matzo. Melba toast. Zwieback. Vegetables Potatoes (without the skin). Most well-cooked and canned vegetables without skins or seeds. Tender lettuce. Fruits Apple sauce. Fruits canned in juice. Cooked apricots, cherries, grapefruit, peaches, pears, or plums. Fresh bananas and cantaloupe. Meats and other protein foods Baked or boiled chicken. Eggs. Tofu. Fish. Seafood. Smooth nut butters. Ground or well-cooked tender beef, ham, veal, lamb, pork, or poultry. Dairy Plain yogurt, kefir, and unsweetened liquid yogurt. Lactose-free milk, buttermilk, skim milk,  or soy milk. Low-fat or nonfat hard cheese. Beverages Water. Low-calorie sports drinks. Fruit juices without pulp. Strained tomato and vegetable juices. Decaffeinated teas.  Sugar-free beverages not sweetened with sugar alcohols. Oral rehydration solutions, if approved by your health care provider. Seasoning and other foods Bouillon, broth, or soups made from recommended foods. What foods are not recommended? The items listed may not be a complete list. Talk with your health care provider about what dietary choices are best for you. Grains Whole grain, whole wheat, bran, or rye breads, rolls, pastas, and crackers. Wild or brown rice. Whole grain or bran cereals. Barley. Oats and oatmeal. Corn tortillas or taco shells. Granola. Popcorn. Vegetables Raw vegetables. Fried vegetables. Cabbage, broccoli, Brussels sprouts, artichokes, baked beans, beet greens, corn, kale, legumes, peas, sweet potatoes, and yams. Potato skins. Cooked spinach and cabbage. Fruits Dried fruit, including raisins and dates. Raw fruits. Stewed or dried prunes. Canned fruits with syrup. Meat and other protein foods Fried or fatty meats. Deli meats. Chunky nut butters. Nuts and seeds. Beans and lentils. Berniece Salines. Hot dogs. Sausage. Dairy High-fat cheeses. Whole milk, chocolate milk, and beverages made with milk, such as milk shakes. Half-and-half. Cream. sour cream. Ice cream. Beverages Caffeinated beverages (such as coffee, tea, soda, or energy drinks). Alcoholic beverages. Fruit juices with pulp. Prune juice. Soft drinks sweetened with high-fructose corn syrup or sugar alcohols. High-calorie sports drinks. Fats and oils Butter. Cream sauces. Margarine. Salad oils. Plain salad dressings. Olives. Avocados. Mayonnaise. Sweets and desserts Sweet rolls, doughnuts, and sweet breads. Sugar-free desserts sweetened with sugar alcohols such as xylitol and sorbitol. Seasoning and other foods Honey. Hot sauce. Chili powder. Gravy. Cream-based or milk-based soups. Pancakes and waffles. Summary  When you have diarrhea, the foods you eat and your eating habits are very important.  Make sure you get at least  8-10 cups of fluid each day, or enough to keep your urine clear or pale yellow.  Eat bland foods and gradually reintroduce healthy, nutrient-rich foods as tolerated, or as told by your health care provider.  Avoid high-fiber, fried, greasy, or spicy foods. This information is not intended to replace advice given to you by your health care provider. Make sure you discuss any questions you have with your health care provider. Document Released: 03/30/2003 Document Revised: 01/05/2016 Document Reviewed: 01/05/2016 Elsevier Interactive Patient Education  [employer].

## 2017-01-28 NOTE — Progress Notes (Signed)
Subjective:  Patient ID: Audrey Russell, female    DOB: 11-29-1955  Age: 62 y.o. MRN: [id number]  CC: Follow-up ((1)   3 mo fu/ fall 3 times last year/ 10/2016 black out--suppose to get an MRI from neurology but didnt go. flu shot? ); Joint Swelling (joints pains---had cortison shot but not better. ); Gas (alot of gas?); Medication Refill (zetia,crestor,janumet?); and Ear Fullness (ear fullness,had a cold recently (2))   Ear Fullness   There is pain in both ears. This is a new problem. The current episode started in the past 7 days. The problem occurs constantly. The problem has been unchanged. There has been no fever. Associated symptoms include abdominal pain, diarrhea and rhinorrhea. Pertinent negatives include no coughing, ear discharge, headaches, hearing loss, sore throat or vomiting. There is no history of a chronic ear infection, hearing loss or a tympanostomy tube.  Diarrhea   This is a chronic problem. The current episode started more than 1 month ago. The problem occurs 2 to 4 times per day. Associated symptoms include abdominal pain, bloating and increased flatus. Pertinent negatives include no chills, coughing, fever, headaches or vomiting. There are no known risk factors. She has tried nothing for the symptoms.  hx of colon polyps ABD bloating, gas and diarrhea are chronic complaints per patient, waxing and waning, previous stool study was negative for c.diff and and bacteria.  Arthritis: Last appt with Dr. Nelva Bush 09/2016.  Depression: Stable mood with celexa.  HTN: Stable with lisinopril. BP Readings from Last 3 Encounters:  01/28/17 122/76  11/05/16 133/70  11/01/16 113/72   Outpatient Medications Prior to Visit  Medication Sig Dispense Refill  . acetaminophen (TYLENOL) 500 MG tablet Take 1,000 mg by mouth every 6 (six) hours as needed for mild pain or moderate pain.    Marland Kitchen gabapentin (NEURONTIN) 100 MG capsule Take 1-3 capsule at bed time 270 capsule 0  . lamoTRIgine  (LAMICTAL) 100 MG tablet Take 1 tablet (100 mg total) by mouth daily. 90 tablet 0  . mirtazapine (REMERON) 30 MG tablet Take 1 tablet (30 mg total) by mouth at bedtime. 30 tablet 2  . citalopram (CELEXA) 40 MG tablet Take 1 tablet (40 mg total) by mouth daily. 90 tablet 1  . ezetimibe (ZETIA) 10 MG tablet Take 1 tablet (10 mg total) by mouth every evening. 90 tablet 0  . lisinopril (PRINIVIL,ZESTRIL) 10 MG tablet Take 1 tablet (10 mg total) by mouth daily. 90 tablet 0  . ranitidine (ZANTAC) 300 MG tablet Take 1 tablet (300 mg total) by mouth at bedtime. 90 tablet 0  . rosuvastatin (CRESTOR) 40 MG tablet Take 1 tablet (40 mg total) by mouth daily. 90 tablet 0  . sitaGLIPtin-metformin (JANUMET) 50-500 MG tablet Take 1 tablet by mouth 2 (two) times daily with a meal. 90 tablet 0   No facility-administered medications prior to visit.     ROS See HPI  Objective:  BP 122/76   Pulse 90   Temp 98.6 F (37 C) (Oral)   Ht 5\' 9"  (1.753 m)   Wt 160 lb (72.6 kg)   SpO2 99%   BMI 23.63 kg/m   BP Readings from Last 3 Encounters:  01/28/17 122/76  11/05/16 133/70  11/01/16 113/72    Wt Readings from Last 3 Encounters:  01/28/17 160 lb (72.6 kg)  10/15/16 168 lb 3.2 oz (76.3 kg)  08/23/16 169 lb (76.7 kg)    Physical Exam  Constitutional: She is oriented to person, place, and  time. No distress.  Cardiovascular: Normal rate, regular rhythm and normal heart sounds.  Pulmonary/Chest: Effort normal and breath sounds normal.  Abdominal: Soft. Bowel sounds are normal. She exhibits distension. There is no tenderness. There is no rebound and no guarding.  Neurological: She is alert and oriented to person, place, and time.  Skin: Skin is warm and dry.  Psychiatric: She has a normal mood and affect. Her behavior is normal.  Vitals reviewed.   Lab Results  Component Value Date   WBC 12.0 (H) 05/24/2016   HGB 14.6 05/24/2016   HCT 43.8 05/24/2016   PLT 252.0 05/24/2016   GLUCOSE 115 (H)  01/28/2017   CHOL 282 (H) 01/28/2017   TRIG 597 (H) 01/28/2017   HDL 36 (L) 01/28/2017   LDLDIRECT 82.0 08/23/2016   ALT 9 01/28/2017   AST 13 01/28/2017   NA 140 01/28/2017   K 4.1 01/28/2017   CL 104 01/28/2017   CREATININE 0.80 01/28/2017   BUN 11 01/28/2017   CO2 22 01/28/2017   TSH 1.88 08/23/2016   HGBA1C 6.9 (H) 01/28/2017   MICROALBUR 0.6 01/28/2017    Dg Epidurography  Result Date: 11/05/2016 CLINICAL DATA:  Post lumbar puncture headache. FLUOROSCOPY TIME:  26 seconds corresponding to a Dose Area Product of 27.93 Gy*m2 PROCEDURE: LUMBAR EPIDURAL BLOOD PATCH INJECTION Informed written consent was obtained. We discussed the medium to high probability of achievement of therapeutic goal, dependent on volume of infusion. Time-out was performed. Prior to the procedure, 20 ml of the patient's blood was harvested using stringent sterile technique. An interlaminar approach was performed at L2-3 on the LEFT. Please note that the blood patch was performed at the actual level of the lumbar puncture as confirmed fluoroscopically. The prior report indicated the L3-4 interspace was the level of puncture, but was actually L2-3. Under stringent sterile technique, overlying skin was cleansed with betadine soap and anesthetized with 1% lidocaine without epinephrine. A 20 gauge needle was advanced using loss-of-resistance technique. DIAGNOSTIC EPIDURAL INJECTION Injection of Isovue-M 200 shows a good epidural pattern with spread above and below the level of needle placement, primarily on the side of needle placement. No vascular or subarachnoid opacification is seen. THERAPEUTIC EPIDURAL INJECTION 15 ml of the patient's blood was injected into the epidural space at the site of prior lumbar puncture. IMPRESSION: Technically successful lumbar blood patch at L2-L3. See comments above. Electronically Signed   By: Staci Righter M.D.   On: 11/05/2016 14:13    Assessment & Plan:   Doniqua was seen today for  follow-up, joint swelling, gas, medication refill and ear fullness.  Diagnoses and all orders for this visit:  HTN (hypertension), benign -     Basic metabolic panel -     lisinopril (PRINIVIL,ZESTRIL) 10 MG tablet; Take 1 tablet (10 mg total) by mouth daily.  Type 2 diabetes mellitus with complication, without long-term current use of insulin (HCC) -     Basic metabolic panel -     Hepatic function panel -     Hemoglobin A1c -     Microalbumin / creatinine urine ratio -     sitaGLIPtin-metformin (JANUMET) 50-500 MG tablet; Take 1 tablet by mouth 2 (two) times daily with a meal.  Diarrhea, unspecified type -     Ambulatory referral to Gastroenterology  Hypertriglyceridemia -     Hepatic function panel -     Lipid panel -     Ambulatory referral to [facility] -  fenofibrate (TRICOR) 145 MG tablet; Take 1 tablet (145 mg total) by mouth daily. -     ezetimibe (ZETIA) 10 MG tablet; Take 1 tablet (10 mg total) by mouth every evening.  Generalized abdominal pain -     Ambulatory referral to Gastroenterology -     ranitidine (ZANTAC) 300 MG tablet; Take 1 tablet (300 mg total) by mouth at bedtime.  Encounter for hepatitis C screening test for low risk patient -     Hepatitis C Antibody  Eustachian tube disorder, bilateral -     cetirizine (ZYRTEC) 10 MG tablet; Take 1 tablet (10 mg total) by mouth daily. -     fluticasone (FLONASE) 50 MCG/ACT nasal spray; Place 2 sprays into both nostrils daily.  Need for influenza vaccination -     Flu vaccine HIGH DOSE PF  Need for diphtheria-tetanus-pertussis (Tdap) vaccine -     Tdap vaccine greater than or equal to 7yo IM  Episode of recurrent major depressive disorder, unspecified depression episode severity (HCC) -     citalopram (CELEXA) 40 MG tablet; Take 1 tablet (40 mg total) by mouth daily.   I have discontinued Joanny Mouser's rosuvastatin. I am also having her start on cetirizine, fluticasone, and fenofibrate.  Additionally, I am having her maintain her acetaminophen, gabapentin, lamoTRIgine, mirtazapine, citalopram, lisinopril, ranitidine, sitaGLIPtin-metformin, and ezetimibe.  Meds ordered this encounter  Medications  . cetirizine (ZYRTEC) 10 MG tablet    Sig: Take 1 tablet (10 mg total) by mouth daily.    Dispense:  30 tablet    Refill:  0    Order Specific Question:   Supervising Provider    Answer:   Lucille Passy [[id number]]  . fluticasone (FLONASE) 50 MCG/ACT nasal spray    Sig: Place 2 sprays into both nostrils daily.    Dispense:  16 g    Refill:  0    Order Specific Question:   Supervising Provider    Answer:   Lucille Passy [[id number]]  . citalopram (CELEXA) 40 MG tablet    Sig: Take 1 tablet (40 mg total) by mouth daily.    Dispense:  90 tablet    Refill:  1    Order Specific Question:   Supervising Provider    Answer:   Lucille Passy [[id number]]  . lisinopril (PRINIVIL,ZESTRIL) 10 MG tablet    Sig: Take 1 tablet (10 mg total) by mouth daily.    Dispense:  90 tablet    Refill:  1    Order Specific Question:   Supervising Provider    Answer:   Lucille Passy [[id number]]  . ranitidine (ZANTAC) 300 MG tablet    Sig: Take 1 tablet (300 mg total) by mouth at bedtime.    Dispense:  90 tablet    Refill:  1    Order Specific Question:   Supervising Provider    Answer:   Lucille Passy [[id number]]  . sitaGLIPtin-metformin (JANUMET) 50-500 MG tablet    Sig: Take 1 tablet by mouth 2 (two) times daily with a meal.    Dispense:  90 tablet    Refill:  1    Order Specific Question:   Supervising Provider    Answer:   Lucille Passy [[id number]]  . fenofibrate (TRICOR) 145 MG tablet    Sig: Take 1 tablet (145 mg total) by mouth daily.    Dispense:  90 tablet    Refill:  1    Order Specific Question:  Supervising Provider    Answer:   Lucille Passy [[id number]]  . ezetimibe (ZETIA) 10 MG tablet    Sig: Take 1 tablet (10 mg total) by mouth every evening.    Dispense:  90 tablet    Refill:  1    Order Specific  Question:   Supervising Provider    Answer:   Lucille Passy [[id number]]    Follow-up: Return in about 3 months (around 04/28/2017) for CPE (fasting).  Wilfred Lacy, NP

## 2017-01-29 ENCOUNTER — Encounter: Payer: Self-pay | Admitting: Nurse Practitioner

## 2017-01-29 LAB — BASIC METABOLIC PANEL
BUN: 11 mg/dL (ref 7–25)
CO2: 22 mmol/L (ref 20–32)
CREATININE: 0.8 mg/dL (ref 0.50–0.99)
Calcium: 9.6 mg/dL (ref 8.6–10.4)
Chloride: 104 mmol/L (ref 98–110)
Glucose, Bld: 115 mg/dL — ABNORMAL HIGH (ref 65–99)
POTASSIUM: 4.1 mmol/L (ref 3.5–5.3)
SODIUM: 140 mmol/L (ref 135–146)

## 2017-01-29 LAB — HEPATIC FUNCTION PANEL
AG RATIO: 1.3 (calc) (ref 1.0–2.5)
ALT: 9 U/L (ref 6–29)
AST: 13 U/L (ref 10–35)
Albumin: 4.4 g/dL (ref 3.6–5.1)
Alkaline phosphatase (APISO): 114 U/L (ref 33–130)
BILIRUBIN DIRECT: 0 mg/dL (ref 0.0–0.2)
BILIRUBIN INDIRECT: 0.3 mg/dL (ref 0.2–1.2)
GLOBULIN: 3.4 g/dL (ref 1.9–3.7)
Total Bilirubin: 0.3 mg/dL (ref 0.2–1.2)
Total Protein: 7.8 g/dL (ref 6.1–8.1)

## 2017-01-29 LAB — MICROALBUMIN / CREATININE URINE RATIO
Creatinine, Urine: 93 mg/dL (ref 20–275)
Microalb Creat Ratio: 6 mcg/mg creat (ref <30)
Microalb, Ur: 0.6 mg/dL

## 2017-01-29 LAB — HEPATITIS C ANTIBODY
Hepatitis C Ab: NONREACTIVE
SIGNAL TO CUT-OFF: 0.04 (ref <1.00)

## 2017-01-29 LAB — LIPID PANEL
CHOL/HDL RATIO: 7.8 (calc) — AB (ref <5.0)
Cholesterol: 282 mg/dL — ABNORMAL HIGH (ref <200)
HDL: 36 mg/dL — ABNORMAL LOW (ref >50)
NON-HDL CHOLESTEROL (CALC): 246 mg/dL — AB (ref <130)
Triglycerides: 597 mg/dL — ABNORMAL HIGH (ref <150)

## 2017-01-29 LAB — HEMOGLOBIN A1C
EAG (MMOL/L): 8.4 (calc)
HEMOGLOBIN A1C: 6.9 %{Hb} — AB (ref <5.7)
Mean Plasma Glucose: 151 (calc)

## 2017-01-29 MED ORDER — SITAGLIPTIN PHOS-METFORMIN HCL 50-500 MG PO TABS: 1.0000 | ORAL_TABLET | Freq: Two times a day (BID) | ORAL | 1 refills | Status: DC

## 2017-01-29 MED ORDER — RANITIDINE HCL 300 MG PO TABS: 300.0000 mg | ORAL_TABLET | Freq: Every day | ORAL | 1 refills | Status: DC

## 2017-01-29 MED ORDER — EZETIMIBE 10 MG PO TABS: 10.0000 mg | ORAL_TABLET | Freq: Every evening | ORAL | 1 refills | Status: DC

## 2017-01-29 MED ORDER — FENOFIBRATE 145 MG PO TABS
145.0000 mg | ORAL_TABLET | Freq: Every day | ORAL | 1 refills | Status: DC
Start: 2017-01-29 — End: 2017-07-28

## 2017-01-29 MED ORDER — LISINOPRIL 10 MG PO TABS: 10.0000 mg | ORAL_TABLET | Freq: Every day | ORAL | 1 refills | Status: DC

## 2017-01-29 MED ORDER — CITALOPRAM HYDROBROMIDE 40 MG PO TABS: 40.0000 mg | ORAL_TABLET | Freq: Every day | ORAL | 1 refills | Status: DC

## 2017-02-07 ENCOUNTER — Telehealth: Payer: Self-pay | Admitting: Nurse Practitioner

## 2017-02-07 NOTE — Telephone Encounter (Signed)
Spoke to Audrey Russell regarding AWV. Patient declined to schedule appt at this time. Patient stated that she wants to talk with [name] first before she schedules her wellness appt.

## 2017-02-11 ENCOUNTER — Ambulatory Visit (HOSPITAL_COMMUNITY): Payer: Self-pay | Admitting: Psychiatry

## 2017-03-03 ENCOUNTER — Encounter: Payer: Self-pay | Admitting: Internal Medicine

## 2017-03-04 ENCOUNTER — Telehealth: Payer: Self-pay | Admitting: Nurse Practitioner

## 2017-03-04 NOTE — Telephone Encounter (Signed)
Copied from [place] [id number]. Topic: Quick Communication - See Telephone Encounter >> Mar 04, 2017  9:05 AM Robina Ade, Helene Kelp D wrote: CRM for notification. See Telephone encounter for: 03/04/17. Patient called and said that she is not feeling better from her last visit with Wilfred Lacy and would like to talk to her or her CMA about having something called in to her pharmacy. Please call patient back, thanks.

## 2017-03-04 NOTE — Telephone Encounter (Signed)
appt is set for 03/10/2017

## 2017-03-04 NOTE — Telephone Encounter (Signed)
Spoke with the pt, she stated that her ears still clog up,hot and chill,nausea in the morning and sneezing. Pt has been using Flonase and zyrtec but it is not helping. Please advise.

## 2017-03-04 NOTE — Telephone Encounter (Signed)
Needs OV.  

## 2017-03-10 ENCOUNTER — Other Ambulatory Visit: Payer: Self-pay | Admitting: Nurse Practitioner

## 2017-03-10 ENCOUNTER — Other Ambulatory Visit (HOSPITAL_COMMUNITY): Payer: Self-pay | Admitting: Psychiatry

## 2017-03-10 ENCOUNTER — Ambulatory Visit (INDEPENDENT_AMBULATORY_CARE_PROVIDER_SITE_OTHER): Payer: PPO | Admitting: Nurse Practitioner

## 2017-03-10 ENCOUNTER — Ambulatory Visit (INDEPENDENT_AMBULATORY_CARE_PROVIDER_SITE_OTHER): Payer: PPO

## 2017-03-10 ENCOUNTER — Encounter: Payer: Self-pay | Admitting: Nurse Practitioner

## 2017-03-10 VITALS — BP 120/80 | HR 98 | Temp 98.2°F | Ht 69.0 in | Wt 168.0 lb

## 2017-03-10 DIAGNOSIS — R05 Cough: Secondary | ICD-10-CM

## 2017-03-10 DIAGNOSIS — F17219 Nicotine dependence, cigarettes, with unspecified nicotine-induced disorders: Secondary | ICD-10-CM

## 2017-03-10 DIAGNOSIS — R918 Other nonspecific abnormal finding of lung field: Secondary | ICD-10-CM | POA: Diagnosis not present

## 2017-03-10 DIAGNOSIS — R058 Other specified cough: Secondary | ICD-10-CM

## 2017-03-10 DIAGNOSIS — J0100 Acute maxillary sinusitis, unspecified: Secondary | ICD-10-CM

## 2017-03-10 DIAGNOSIS — H6993 Unspecified Eustachian tube disorder, bilateral: Secondary | ICD-10-CM

## 2017-03-10 DIAGNOSIS — F339 Major depressive disorder, recurrent, unspecified: Secondary | ICD-10-CM

## 2017-03-10 MED ORDER — DM-GUAIFENESIN ER 30-600 MG PO TB12
1.0000 | ORAL_TABLET | Freq: Two times a day (BID) | ORAL | 0 refills | Status: DC | PRN
Start: 2017-03-10 — End: 2017-04-21

## 2017-03-10 MED ORDER — DOXYCYCLINE HYCLATE 100 MG PO TABS
100.0000 mg | ORAL_TABLET | Freq: Two times a day (BID) | ORAL | 0 refills | Status: AC
Start: 2017-03-10 — End: 2017-03-17

## 2017-03-10 MED ORDER — FLUTICASONE PROPIONATE 50 MCG/ACT NA SUSP
2.0000 | Freq: Every day | NASAL | 3 refills | Status: DC
Start: 2017-03-10 — End: 2017-08-11

## 2017-03-10 NOTE — Progress Notes (Signed)
Subjective:  Patient ID: Audrey Russell, female    DOB: [birth year]  Age: 62 y.o. MRN: [id number]  CC: Cough (coughing green mucus,hot and cold,sweat alot,ear stop ups,zyrtect and flonase made her stomach upset/2 wk/ daughter in law dx flu)   Cough  This is a new problem. The current episode started 1 to 4 weeks ago. The problem has been waxing and waning. The cough is productive of purulent sputum. Associated symptoms include chills, ear congestion, ear pain, nasal congestion, postnasal drip, rhinorrhea and sweats. Pertinent negatives include no chest pain, fever, headaches, shortness of breath or wheezing. Risk factors for lung disease include smoking/tobacco exposure. She has tried OTC cough suppressant for the symptoms. The treatment provided no relief.    Outpatient Medications Prior to Visit  Medication Sig Dispense Refill  . acetaminophen (TYLENOL) 500 MG tablet Take 1,000 mg by mouth every 6 (six) hours as needed for mild pain or moderate pain.    . citalopram (CELEXA) 40 MG tablet Take 1 tablet (40 mg total) by mouth daily. 90 tablet 1  . ezetimibe (ZETIA) 10 MG tablet Take 1 tablet (10 mg total) by mouth every evening. 90 tablet 1  . fenofibrate (TRICOR) 145 MG tablet Take 1 tablet (145 mg total) by mouth daily. 90 tablet 1  . gabapentin (NEURONTIN) 100 MG capsule Take 1-3 capsule at bed time 270 capsule 0  . lamoTRIgine (LAMICTAL) 100 MG tablet Take 1 tablet (100 mg total) by mouth daily. 90 tablet 0  . lisinopril (PRINIVIL,ZESTRIL) 10 MG tablet Take 1 tablet (10 mg total) by mouth daily. 90 tablet 1  . mirtazapine (REMERON) 30 MG tablet Take 1 tablet (30 mg total) by mouth at bedtime. 30 tablet 2  . ranitidine (ZANTAC) 300 MG tablet Take 1 tablet (300 mg total) by mouth at bedtime. 90 tablet 1  . sitaGLIPtin-metformin (JANUMET) 50-500 MG tablet Take 1 tablet by mouth 2 (two) times daily with a meal. 90 tablet 1  . cetirizine (ZYRTEC) 10 MG tablet Take 1 tablet (10 mg total) by  mouth daily. (Patient not taking: Reported on 03/10/2017) 30 tablet 0  . fluticasone (FLONASE) 50 MCG/ACT nasal spray Place 2 sprays into both nostrils daily. (Patient not taking: Reported on 03/10/2017) 16 g 3   No facility-administered medications prior to visit.     ROS See HPI  Objective:  BP 120/80   Pulse 98   Temp 98.2 F (36.8 C)   Ht 5\' 9"  (1.753 m)   Wt 168 lb (76.2 kg)   SpO2 98%   BMI 24.81 kg/m   BP Readings from Last 3 Encounters:  03/10/17 120/80  01/28/17 122/76  11/05/16 133/70    Wt Readings from Last 3 Encounters:  03/10/17 168 lb (76.2 kg)  01/28/17 160 lb (72.6 kg)  10/15/16 168 lb 3.2 oz (76.3 kg)    Physical Exam  Constitutional: She is oriented to person, place, and time.  HENT:  Right Ear: External ear and ear canal normal. Tympanic membrane is not injected and not erythematous. A middle ear effusion is present.  Left Ear: External ear and ear canal normal. Tympanic membrane is not injected and not erythematous. A middle ear effusion is present.  Nose: Mucosal edema and rhinorrhea present. Right sinus exhibits maxillary sinus tenderness. Right sinus exhibits no frontal sinus tenderness. Left sinus exhibits maxillary sinus tenderness. Left sinus exhibits no frontal sinus tenderness.  Mouth/Throat: Uvula is midline. No trismus in the jaw. Posterior oropharyngeal erythema present. No oropharyngeal exudate.  Eyes: No scleral icterus.  Neck: Normal range of motion. Neck supple.  Cardiovascular: Normal rate and normal heart sounds.  Pulmonary/Chest: Effort normal and breath sounds normal.  Musculoskeletal: She exhibits no edema.  Lymphadenopathy:    She has no cervical adenopathy.  Neurological: She is alert and oriented to person, place, and time.  Vitals reviewed.   Lab Results  Component Value Date   WBC 12.0 (H) 05/24/2016   HGB 14.6 05/24/2016   HCT 43.8 05/24/2016   PLT 252.0 05/24/2016   GLUCOSE 115 (H) 01/28/2017   CHOL 282 (H)  01/28/2017   TRIG 597 (H) 01/28/2017   HDL 36 (L) 01/28/2017   LDLDIRECT 82.0 08/23/2016   ALT 9 01/28/2017   AST 13 01/28/2017   NA 140 01/28/2017   K 4.1 01/28/2017   CL 104 01/28/2017   CREATININE 0.80 01/28/2017   BUN 11 01/28/2017   CO2 22 01/28/2017   TSH 1.88 08/23/2016   HGBA1C 6.9 (H) 01/28/2017   MICROALBUR 0.6 01/28/2017    Dg Epidurography  Result Date: 11/05/2016 CLINICAL DATA:  Post lumbar puncture headache. FLUOROSCOPY TIME:  26 seconds corresponding to a Dose Area Product of 27.93 Gy*m2 PROCEDURE: LUMBAR EPIDURAL BLOOD PATCH INJECTION Informed written consent was obtained. We discussed the medium to high probability of achievement of therapeutic goal, dependent on volume of infusion. Time-out was performed. Prior to the procedure, 20 ml of the patient's blood was harvested using stringent sterile technique. An interlaminar approach was performed at L2-3 on the LEFT. Please note that the blood patch was performed at the actual level of the lumbar puncture as confirmed fluoroscopically. The prior report indicated the L3-4 interspace was the level of puncture, but was actually L2-3. Under stringent sterile technique, overlying skin was cleansed with betadine soap and anesthetized with 1% lidocaine without epinephrine. A 20 gauge needle was advanced using loss-of-resistance technique. DIAGNOSTIC EPIDURAL INJECTION Injection of Isovue-M 200 shows a good epidural pattern with spread above and below the level of needle placement, primarily on the side of needle placement. No vascular or subarachnoid opacification is seen. THERAPEUTIC EPIDURAL INJECTION 15 ml of the patient's blood was injected into the epidural space at the site of prior lumbar puncture. IMPRESSION: Technically successful lumbar blood patch at L2-L3. See comments above. Electronically Signed   By: Staci Righter M.D.   On: 11/05/2016 14:13    Assessment & Plan:   Audrey Russell was seen today for cough.  Diagnoses and all  orders for this visit:  Acute non-recurrent maxillary sinusitis -     DG Chest 2 View; Future  Cough with sputum -     DG Chest 2 View; Future -     dextromethorphan-guaiFENesin (MUCINEX DM) 30-600 MG 12hr tablet; Take 1 tablet by mouth 2 (two) times daily as needed for cough. -     DG Chest 2 View; Future -     doxycycline (VIBRA-TABS) 100 MG tablet; Take 1 tablet (100 mg total) by mouth 2 (two) times daily for 7 days.  Cigarette nicotine dependence with nicotine-induced disorder -     DG Chest 2 View; Future -     DG Chest 2 View; Future  Opacity of lung on imaging study -     DG Chest 2 View; Future -     doxycycline (VIBRA-TABS) 100 MG tablet; Take 1 tablet (100 mg total) by mouth 2 (two) times daily for 7 days.   I am having Audrey Russell start on dextromethorphan-guaiFENesin and doxycycline. I  am also having her maintain her acetaminophen, gabapentin, lamoTRIgine, mirtazapine, cetirizine, citalopram, lisinopril, ranitidine, sitaGLIPtin-metformin, fenofibrate, ezetimibe, and fluticasone.  Meds ordered this encounter  Medications  . dextromethorphan-guaiFENesin (MUCINEX DM) 30-600 MG 12hr tablet    Sig: Take 1 tablet by mouth 2 (two) times daily as needed for cough.    Dispense:  14 tablet    Refill:  0    Order Specific Question:   Supervising Provider    Answer:   Lucille Passy [[id number]]  . doxycycline (VIBRA-TABS) 100 MG tablet    Sig: Take 1 tablet (100 mg total) by mouth 2 (two) times daily for 7 days.    Dispense:  14 tablet    Refill:  0    Order Specific Question:   Supervising Provider    Answer:   Lucille Passy [[id number]]    Follow-up: No Follow-up on file.  Wilfred Lacy, NP

## 2017-03-10 NOTE — Patient Instructions (Addendum)
Notified Audrey Russell about CXR results. Informed her about oral KPT:[id number]. Levaquin not used due to DDI with citalopram. Advised her to return to office in 4weeks for repeat CXR. She verbalized understanding  And agreed.

## 2017-03-11 ENCOUNTER — Encounter: Payer: Self-pay | Admitting: Nurse Practitioner

## 2017-03-11 ENCOUNTER — Other Ambulatory Visit (HOSPITAL_COMMUNITY): Payer: Self-pay | Admitting: Psychiatry

## 2017-03-20 ENCOUNTER — Ambulatory Visit (INDEPENDENT_AMBULATORY_CARE_PROVIDER_SITE_OTHER): Payer: PPO | Admitting: Psychiatry

## 2017-03-20 ENCOUNTER — Encounter (HOSPITAL_COMMUNITY): Payer: Self-pay | Admitting: Psychiatry

## 2017-03-20 DIAGNOSIS — E119 Type 2 diabetes mellitus without complications: Secondary | ICD-10-CM | POA: Diagnosis not present

## 2017-03-20 DIAGNOSIS — R450 Nervousness: Secondary | ICD-10-CM

## 2017-03-20 DIAGNOSIS — F339 Major depressive disorder, recurrent, unspecified: Secondary | ICD-10-CM | POA: Diagnosis not present

## 2017-03-20 DIAGNOSIS — G894 Chronic pain syndrome: Secondary | ICD-10-CM

## 2017-03-20 DIAGNOSIS — G35 Multiple sclerosis: Secondary | ICD-10-CM | POA: Diagnosis not present

## 2017-03-20 DIAGNOSIS — Z87891 Personal history of nicotine dependence: Secondary | ICD-10-CM | POA: Diagnosis not present

## 2017-03-20 DIAGNOSIS — M797 Fibromyalgia: Secondary | ICD-10-CM | POA: Diagnosis not present

## 2017-03-20 DIAGNOSIS — G4700 Insomnia, unspecified: Secondary | ICD-10-CM | POA: Diagnosis not present

## 2017-03-20 MED ORDER — GABAPENTIN 300 MG PO CAPS: 300.0000 mg | ORAL_CAPSULE | Freq: Every day | ORAL | 0 refills | Status: DC

## 2017-03-20 MED ORDER — LAMOTRIGINE 100 MG PO TABS: 100.0000 mg | ORAL_TABLET | Freq: Every day | ORAL | 0 refills | Status: DC

## 2017-03-21 NOTE — Progress Notes (Signed)
BH MD/PA/NP OP Progress Note  03/21/2017 9:15 AM Audrey Russell  MRN:  [id number]  Chief Complaint: I like new medication.  HPI: Patient is 62 year old African-American retired separated female who was seen first time 4 months ago for initial evaluation.  Patient has history of depression and anxiety symptoms.  She apologized missing appointment but she like to increase Lamictal.  She denies any irritability, anger, mania or any psychosis.  She still have a lot of health issues including fibromyalgia, diabetes, MS, chronic pain numbness tingling and fatigue.  Recently she had cough and she went to the primary care physician and she was concerned because there were spots in the her lungs.  She was given antibiotic and recommended to repeat chest x-ray in 2 weeks.  Patient noticed since she increased the Lamictal her crying spells, irritability, mood swings and depression is improved.  Sleeping better.  She denies any suicidal thoughts or any homicidal thoughts.  She is also taking gabapentin which is helping her neuropathy pain and sleep.  With her daughter but hoping to find her own place in the future.  Patient is still debating herself if she moved back to [place].  Patient has no tremors, shakes, EPS or any rash or itching.  Has any major panic attack.  Patient denies drinking alcohol or using any illegal substances.  She has not seen neurology yet for her MS.  Visit Diagnosis:    ICD-10-CM   1. Episode of recurrent major depressive disorder, unspecified depression episode severity (HCC) F33.9 lamoTRIgine (LAMICTAL) 100 MG tablet    gabapentin (NEURONTIN) 300 MG capsule    Past Psychiatric History:  Patient has depression since 2003.  [facility] in [place].  She was prescribed Klonopin, Valium by her primary care physician.  Patient has history of heavy drinking and using cannabis for many years but denies any binge, intoxication, seizures or any  blackouts or withdrawal symptoms.  Patient denies any history of suicidal attempt or any psychiatric inpatient treatment.  Patient admitted history of irritability, mood swing, highs and lows and diagnosed bipolar disorder by psychiatrist in [place].  She is taking Remeron and Celexa.  Past Medical History:  Past Medical History:  Diagnosis Date  . Anxiety   . Arthritis   . Bipolar disorder ([place])   . Chickenpox   . Depression   . Diabetes mellitus without complication ([place])   . Fibromyalgia   . Hyperlipidemia   . Hypertension   . Multiple sclerosis ([place])     Past Surgical History:  Procedure Laterality Date  . ABDOMINAL HYSTERECTOMY  1997&1998   partial  . CHOLECYSTECTOMY  1997  . FACIAL RECONSTRUCTION SURGERY      Family Psychiatric History: Reviewed.  Family History:  Family History  Problem Relation Age of Onset  . Arthritis Mother   . Heart disease Mother   . Stroke Mother   . Hypertension Mother   . Epilepsy Mother   . Emphysema Mother   . Heart disease Father   . Hypertension Father   . Diabetes Father   . Arthritis Maternal Grandmother   . Diabetes Paternal Grandmother   . Autoimmune disease Neg Hx     Social History:  Social History   Socioeconomic History  . Marital status: Widowed    Spouse name: None  . Number of children: 2  . Years of education: 5  . Highest education level: None  Social Needs  . Financial resource strain: None  . Food  insecurity - worry: None  . Food insecurity - inability: None  . Transportation needs - medical: None  . Transportation needs - non-medical: None  Occupational History  . Occupation: Retired  Tobacco Use  . Smoking status: Former Smoker    Types: Cigarettes    Last attempt to quit: 02/2016    Years since quitting: 1.0  . Smokeless tobacco: Never Used  Substance and Sexual Activity  . Alcohol use: No  . Drug use: No  . Sexual activity: No  Other Topics Concern  . None  Social History Narrative    Lives w/ her daughter   Right-handed   Caffeine: once per day    Allergies: No Known Allergies  Metabolic Disorder Labs: Recent Results (from the past 2160 hour(s))  Hepatitis C Antibody     Status: None   Collection Time: 01/28/17  4:14 PM  Result Value Ref Range   Hepatitis C Ab NON-REACTIVE NON-REACTI   SIGNAL TO CUT-OFF 0.04 <8.25  Basic metabolic panel     Status: Abnormal   Collection Time: 01/28/17  4:14 PM  Result Value Ref Range   Glucose, Bld 115 (H) 65 - 99 mg/dL    Comment: .            Fasting reference interval . For someone without known diabetes, a glucose value between 100 and 125 mg/dL is consistent with prediabetes and should be confirmed with a follow-up test. .    BUN 11 7 - 25 mg/dL   Creat 0.80 0.50 - 0.99 mg/dL    Comment: For patients >17 years of age, the reference limit for Creatinine is approximately 13% higher for people identified as African-American. .    BUN/Creatinine Ratio NOT APPLICABLE 6 - 22 (calc)   Sodium 140 135 - 146 mmol/L   Potassium 4.1 3.5 - 5.3 mmol/L   Chloride 104 98 - 110 mmol/L   CO2 22 20 - 32 mmol/L   Calcium 9.6 8.6 - 10.4 mg/dL  Hepatic function panel     Status: None   Collection Time: 01/28/17  4:14 PM  Result Value Ref Range   Total Protein 7.8 6.1 - 8.1 g/dL   Albumin 4.4 3.6 - 5.1 g/dL   Globulin 3.4 1.9 - 3.7 g/dL (calc)   AG Ratio 1.3 1.0 - 2.5 (calc)   Total Bilirubin 0.3 0.2 - 1.2 mg/dL   Bilirubin, Direct 0.0 0.0 - 0.2 mg/dL   Indirect Bilirubin 0.3 0.2 - 1.2 mg/dL (calc)   Alkaline phosphatase (APISO) 114 33 - 130 U/L   AST 13 10 - 35 U/L   ALT 9 6 - 29 U/L  Lipid panel     Status: Abnormal   Collection Time: 01/28/17  4:14 PM  Result Value Ref Range   Cholesterol 282 (H) <200 mg/dL   HDL 36 (L) >50 mg/dL   Triglycerides 597 (H) <150 mg/dL   LDL Cholesterol (Calc)  mg/dL (calc)    Comment: . LDL cholesterol not calculated. Triglyceride levels greater than 400 mg/dL invalidate calculated LDL  results. . Reference range: <100 . Desirable range <100 mg/dL for primary prevention;   <70 mg/dL for patients with CHD or diabetic patients  with > or = 2 CHD risk factors. Marland Kitchen LDL-C is now calculated using the Martin-Hopkins  calculation, which is a validated novel method providing  better accuracy than the Friedewald equation in the  estimation of LDL-C.  Cresenciano Genre et al. Annamaria Helling. 0539;767(34): 2061-2068  (http://education.[facility].com/faq/FAQ164)    Total  CHOL/HDL Ratio 7.8 (H) <5.0 (calc)   Non-HDL Cholesterol (Calc) 246 (H) <130 mg/dL (calc)    Comment: Non-HDL level > or = 220 is very high and may indicate  genetic familial hypercholesterolemia (FH). Clinical  assessment and measurement of blood lipid levels  should be considered for all first-degree relatives  of patients with an FH diagnosis. . For patients with diabetes plus 1 major ASCVD risk  factor, treating to a non-HDL-C goal of <100 mg/dL  (LDL-C of <70 mg/dL) is considered a therapeutic  option.   Hemoglobin A1c     Status: Abnormal   Collection Time: 01/28/17  4:14 PM  Result Value Ref Range   Hgb A1c MFr Bld 6.9 (H) <5.7 % of total Hgb    Comment: For someone without known diabetes, a hemoglobin A1c value of 6.5% or greater indicates that they may have  diabetes and this should be confirmed with a follow-up  test. . For someone with known diabetes, a value <7% indicates  that their diabetes is well controlled and a value  greater than or equal to 7% indicates suboptimal  control. A1c targets should be individualized based on  duration of diabetes, age, comorbid conditions, and  other considerations. . Currently, no consensus exists regarding use of hemoglobin A1c for diagnosis of diabetes for children. .    Mean Plasma Glucose 151 (calc)   eAG (mmol/L) 8.4 (calc)  Microalbumin / creatinine urine ratio     Status: None   Collection Time: 01/28/17  4:14 PM  Result Value Ref Range   Creatinine,  Urine 93 20 - 275 mg/dL   Microalb, Ur 0.6 mg/dL    Comment: Reference Range Not established    Microalb Creat Ratio 6 <30 mcg/mg creat    Comment: . The ADA defines abnormalities in albumin excretion as follows: Marland Kitchen Category         Result (mcg/mg creatinine) . Normal                    <30 Microalbuminuria         30-299  Clinical albuminuria   > OR = 300 . The ADA recommends that at least two of three specimens collected within a 3-6 month period be abnormal before considering a patient to be within a diagnostic category.    Lab Results  Component Value Date   HGBA1C 6.9 (H) 01/28/2017   MPG 151 01/28/2017   No results found for: PROLACTIN Lab Results  Component Value Date   CHOL 282 (H) 01/28/2017   TRIG 597 (H) 01/28/2017   HDL 36 (L) 01/28/2017   CHOLHDL 7.8 (H) 01/28/2017   Lab Results  Component Value Date   TSH 1.88 08/23/2016    Therapeutic Level Labs: No results found for: LITHIUM No results found for: VALPROATE No components found for:  CBMZ  Current Medications: Current Outpatient Medications  Medication Sig Dispense Refill  . acetaminophen (TYLENOL) 500 MG tablet Take 1,000 mg by mouth every 6 (six) hours as needed for mild pain or moderate pain.    . cetirizine (ZYRTEC) 10 MG tablet Take 1 tablet (10 mg total) by mouth daily. 30 tablet 0  . citalopram (CELEXA) 40 MG tablet Take 1 tablet (40 mg total) by mouth daily. 90 tablet 1  . dextromethorphan-guaiFENesin (MUCINEX DM) 30-600 MG 12hr tablet Take 1 tablet by mouth 2 (two) times daily as needed for cough. 14 tablet 0  . ezetimibe (ZETIA) 10 MG tablet Take 1 tablet (  10 mg total) by mouth every evening. 90 tablet 1  . fenofibrate (TRICOR) 145 MG tablet Take 1 tablet (145 mg total) by mouth daily. 90 tablet 1  . fluticasone (FLONASE) 50 MCG/ACT nasal spray Place 2 sprays into both nostrils daily. 16 g 3  . gabapentin (NEURONTIN) 300 MG capsule Take 1 capsule (300 mg total) by mouth at bedtime. 90  capsule 0  . lamoTRIgine (LAMICTAL) 100 MG tablet Take 1 tablet (100 mg total) by mouth daily. 90 tablet 0  . lisinopril (PRINIVIL,ZESTRIL) 10 MG tablet Take 1 tablet (10 mg total) by mouth daily. 90 tablet 1  . mirtazapine (REMERON) 30 MG tablet Take 1 tablet (30 mg total) by mouth at bedtime. 30 tablet 2  . ranitidine (ZANTAC) 300 MG tablet Take 1 tablet (300 mg total) by mouth at bedtime. 90 tablet 1  . sitaGLIPtin-metformin (JANUMET) 50-500 MG tablet Take 1 tablet by mouth 2 (two) times daily with a meal. 90 tablet 1   No current facility-administered medications for this visit.      Musculoskeletal: Strength & Muscle Tone: within normal limits Gait & Station: normal Patient leans: N/A  Psychiatric Specialty Exam: Review of Systems  Constitutional: Positive for malaise/fatigue.  HENT: Negative.   Respiratory: Positive for cough.   Musculoskeletal: Positive for back pain and joint pain.  Skin: Negative.  Negative for itching and rash.  Neurological: Positive for tingling.  Psychiatric/Behavioral: The patient is nervous/anxious and has insomnia.     Blood pressure 121/79, pulse 86, height 5\' 9"  (1.753 m), weight 168 lb 6.4 oz (76.4 kg).Body mass index is 24.87 kg/m.  General Appearance: Casual  Eye Contact:  Fair  Speech:  Clear and Coherent  Volume:  Normal  Mood:  Anxious  Affect:  Appropriate  Thought Process:  Goal Directed  Orientation:  Full (Time, Place, and Person)  Thought Content: Logical   Suicidal Thoughts:  No  Homicidal Thoughts:  No  Memory:  Immediate;   Good Recent;   Good Remote;   Good  Judgement:  Good  Insight:  Good  Psychomotor Activity:  Normal  Concentration:  Concentration: Fair and Attention Span: Fair  Recall:  Good  Fund of Knowledge: Good  Language: Good  Akathisia:  No  Handed:  Right  AIMS (if indicated): not done  Assets:  Communication Skills Desire for Improvement Housing Resilience  ADL's:  Intact  Cognition: WNL  Sleep:   Fair   Screenings: PHQ2-9     Office Visit from 04/04/2016 in [facility]  PHQ-2 Total Score  2  PHQ-9 Total Score  10       Assessment and Plan: Bipolar disorder, depressed type.  Rule out major depressive disorder, recurrent.  Anxiety disorder NOS.  Patient doing better since we increased the Lamictal.  She is tolerating well and reported no side effects.  She has no rash, itching tremors or shakes.  I recommended to try Neurontin 300 mg at bedtime to help residual anxiety and insomnia.  She is taking Celexa and Remeron from her primary care physician.  I also reviewed blood work results and collect information from other providers.  Encourage to schedule appointment to see a neurologist for her neurological problem.  Patient has MS.  Discussed medication side effects and benefits.  Recommended to call us back if she has any question or any concern.  Follow-up in 2 months.  Time spent 25 minutes.  More than 50% of the time spent in psychoeducation, counseling,  coronation of care and reviewing collect information.   Kathlee Nations, MD 03/21/2017, 9:15 AM

## 2017-04-11 ENCOUNTER — Telehealth: Payer: Self-pay | Admitting: Nurse Practitioner

## 2017-04-11 ENCOUNTER — Other Ambulatory Visit: Payer: Self-pay | Admitting: *Deleted

## 2017-04-11 DIAGNOSIS — F339 Major depressive disorder, recurrent, unspecified: Secondary | ICD-10-CM

## 2017-04-11 MED ORDER — MIRTAZAPINE 30 MG PO TABS: 30.0000 mg | ORAL_TABLET | Freq: Every day | ORAL | 0 refills | Status: DC

## 2017-04-11 NOTE — Telephone Encounter (Signed)
Copied from [place]. Topic: Quick Communication - Rx Refill/Question >> Apr 11, 2017 10:07 AM Ahmed Prima L wrote: Medication: mirtazapine (REMERON) 30 MG tablet Has the patient contacted their pharmacy? yes (Agent: If no, request that the patient contact the pharmacy for the refill.) Preferred Pharmacy (with phone number or street name): [id number] - [place], [place] Agent: Please be advised that RX refills may take up to 3 business days. We ask that you follow-up with your pharmacy.

## 2017-04-11 NOTE — Telephone Encounter (Signed)
Rx refilled per protocol and OV note- patient does have follow up scheduled as directed.

## 2017-04-21 ENCOUNTER — Other Ambulatory Visit: Payer: Self-pay

## 2017-04-21 ENCOUNTER — Ambulatory Visit (AMBULATORY_SURGERY_CENTER): Payer: Self-pay | Admitting: *Deleted

## 2017-04-21 VITALS — Ht 69.0 in | Wt 165.0 lb

## 2017-04-21 DIAGNOSIS — Z1211 Encounter for screening for malignant neoplasm of colon: Secondary | ICD-10-CM

## 2017-04-21 MED ORDER — PEG-KCL-NACL-NASULF-NA ASC-C 140 G PO SOLR: 1.0000 | Freq: Once | ORAL | 0 refills | Status: AC

## 2017-04-21 NOTE — Progress Notes (Signed)
Patient denies any allergies to eggs or soy. Patient denies any problems with anesthesia/sedation. Patient denies any oxygen use at home. Patient denies taking any diet/weight loss medications or blood thinners. EMMI education declined by pt.  

## 2017-04-22 ENCOUNTER — Encounter: Payer: Self-pay | Admitting: Internal Medicine

## 2017-04-28 ENCOUNTER — Encounter: Payer: Self-pay | Admitting: Nurse Practitioner

## 2017-04-29 ENCOUNTER — Encounter: Payer: Self-pay | Admitting: Internal Medicine

## 2017-05-06 ENCOUNTER — Ambulatory Visit (INDEPENDENT_AMBULATORY_CARE_PROVIDER_SITE_OTHER): Payer: PPO | Admitting: Nurse Practitioner

## 2017-05-06 ENCOUNTER — Ambulatory Visit (INDEPENDENT_AMBULATORY_CARE_PROVIDER_SITE_OTHER): Payer: PPO

## 2017-05-06 ENCOUNTER — Encounter: Payer: Self-pay | Admitting: Nurse Practitioner

## 2017-05-06 VITALS — BP 108/68 | HR 85 | Temp 98.7°F | Ht 69.0 in | Wt 164.4 lb

## 2017-05-06 DIAGNOSIS — R918 Other nonspecific abnormal finding of lung field: Secondary | ICD-10-CM

## 2017-05-06 DIAGNOSIS — J0141 Acute recurrent pansinusitis: Secondary | ICD-10-CM

## 2017-05-06 DIAGNOSIS — R05 Cough: Secondary | ICD-10-CM | POA: Diagnosis not present

## 2017-05-06 MED ORDER — PREDNISONE 10 MG (21) PO TBPK: ORAL_TABLET | ORAL | 0 refills | Status: DC

## 2017-05-06 MED ORDER — SALINE SPRAY 0.65 % NA SOLN: 1.0000 | NASAL | 0 refills | Status: DC | PRN

## 2017-05-06 MED ORDER — GUAIFENESIN ER 600 MG PO TB12: 600.0000 mg | ORAL_TABLET | Freq: Every day | ORAL | 0 refills | Status: DC

## 2017-05-06 MED ORDER — SACCHAROMYCES BOULARDII 250 MG PO CAPS: 250.0000 mg | ORAL_CAPSULE | Freq: Two times a day (BID) | ORAL | Status: DC

## 2017-05-06 MED ORDER — AMOXICILLIN-POT CLAVULANATE 875-125 MG PO TABS: 1.0000 | ORAL_TABLET | Freq: Two times a day (BID) | ORAL | 0 refills | Status: DC

## 2017-05-06 MED ORDER — CETIRIZINE HCL 10 MG PO TABS: 10.0000 mg | ORAL_TABLET | Freq: Every day | ORAL | 1 refills | Status: DC

## 2017-05-06 NOTE — Progress Notes (Signed)
Subjective:  Patient ID: Audrey Russell, female    DOB: 11-19-1955  Age: 62 y.o. MRN: [id number]  CC: Sinus Problem (3-4 months having issues. Uses Flonase and Zyrtec. Still coughing all day, productive with green mucous. unsure is chest xray is needed.)  Sinus Problem  This is a recurrent problem. The current episode started more than 1 month ago. The problem has been waxing and waning since onset. There has been no fever. Associated symptoms include chills, congestion, coughing, headaches, sinus pressure, sneezing, a sore throat and swollen glands. Pertinent negatives include no diaphoresis, ear pain, hoarse voice, neck pain or shortness of breath. Past treatments include antibiotics, oral decongestants and spray decongestants. The treatment provided mild relief.   Has persistent productive cough.  Outpatient Medications Prior to Visit  Medication Sig Dispense Refill  . acetaminophen (TYLENOL) 500 MG tablet Take 1,000 mg by mouth every 6 (six) hours as needed for mild pain or moderate pain.    . citalopram (CELEXA) 40 MG tablet Take 1 tablet (40 mg total) by mouth daily. 90 tablet 1  . ezetimibe (ZETIA) 10 MG tablet Take 1 tablet (10 mg total) by mouth every evening. 90 tablet 1  . fenofibrate (TRICOR) 145 MG tablet Take 1 tablet (145 mg total) by mouth daily. 90 tablet 1  . fluticasone (FLONASE) 50 MCG/ACT nasal spray Place 2 sprays into both nostrils daily. 16 g 3  . gabapentin (NEURONTIN) 300 MG capsule Take 1 capsule (300 mg total) by mouth at bedtime. 90 capsule 0  . lamoTRIgine (LAMICTAL) 100 MG tablet Take 1 tablet (100 mg total) by mouth daily. 90 tablet 0  . lisinopril (PRINIVIL,ZESTRIL) 10 MG tablet Take 1 tablet (10 mg total) by mouth daily. 90 tablet 1  . mirtazapine (REMERON) 30 MG tablet Take 1 tablet (30 mg total) by mouth at bedtime. 30 tablet 0  . ranitidine (ZANTAC) 300 MG tablet Take 1 tablet (300 mg total) by mouth at bedtime. 90 tablet 1  . rosuvastatin (CRESTOR) 40  MG tablet Take 40 mg by mouth daily.  0  . sitaGLIPtin-metformin (JANUMET) 50-500 MG tablet Take 1 tablet by mouth 2 (two) times daily with a meal. 90 tablet 1   No facility-administered medications prior to visit.     ROS See HPI  Objective:  BP 108/68 (BP Location: Left Arm, Patient Position: Sitting, Cuff Size: Normal)   Pulse 85   Temp 98.7 F (37.1 C) (Oral)   Ht 5\' 9"  (1.753 m)   Wt 164 lb 6.4 oz (74.6 kg)   SpO2 100%   BMI 24.28 kg/m   BP Readings from Last 3 Encounters:  05/06/17 108/68  03/10/17 120/80  01/28/17 122/76    Wt Readings from Last 3 Encounters:  05/06/17 164 lb 6.4 oz (74.6 kg)  04/21/17 165 lb (74.8 kg)  03/10/17 168 lb (76.2 kg)    Physical Exam  Constitutional: No distress.  HENT:  Nose: Mucosal edema, rhinorrhea and sinus tenderness present. Right sinus exhibits maxillary sinus tenderness and frontal sinus tenderness. Left sinus exhibits maxillary sinus tenderness and frontal sinus tenderness.  Mouth/Throat: Uvula is midline. Posterior oropharyngeal erythema present.  Neck: Normal range of motion.  Cardiovascular: Normal rate.  Pulmonary/Chest: Effort normal.  Vitals reviewed.   Lab Results  Component Value Date   WBC 12.0 (H) 05/24/2016   HGB 14.6 05/24/2016   HCT 43.8 05/24/2016   PLT 252.0 05/24/2016   GLUCOSE 115 (H) 01/28/2017   CHOL 282 (H) 01/28/2017   TRIG  597 (H) 01/28/2017   HDL 36 (L) 01/28/2017   LDLDIRECT 82.0 08/23/2016   [place]  01/28/2017     Comment:     . LDL cholesterol not calculated. Triglyceride levels greater than 400 mg/dL invalidate calculated LDL results. . Reference range: <100 . Desirable range <100 mg/dL for primary prevention;   <70 mg/dL for patients with CHD or diabetic patients  with > or = 2 CHD risk factors. Marland Kitchen LDL-C is now calculated using the Martin-Hopkins  calculation, which is a validated novel method providing  better accuracy than the Friedewald equation in the  estimation of  LDL-C.  Cresenciano Genre et al. Annamaria Helling. 1941;740(81): 2061-2068  (http://education.[facility].com/faq/FAQ164)    ALT 9 01/28/2017   AST 13 01/28/2017   NA 140 01/28/2017   K 4.1 01/28/2017   CL 104 01/28/2017   CREATININE 0.80 01/28/2017   BUN 11 01/28/2017   CO2 22 01/28/2017   TSH 1.88 08/23/2016   HGBA1C 6.9 (H) 01/28/2017   MICROALBUR 0.6 01/28/2017    Dg Epidurography  Result Date: 11/05/2016 CLINICAL DATA:  Post lumbar puncture headache. FLUOROSCOPY TIME:  26 seconds corresponding to a Dose Area Product of 27.93 Gy*m2 PROCEDURE: LUMBAR EPIDURAL BLOOD PATCH INJECTION Informed written consent was obtained. We discussed the medium to high probability of achievement of therapeutic goal, dependent on volume of infusion. Time-out was performed. Prior to the procedure, 20 ml of the patient's blood was harvested using stringent sterile technique. An interlaminar approach was performed at L2-3 on the LEFT. Please note that the blood patch was performed at the actual level of the lumbar puncture as confirmed fluoroscopically. The prior report indicated the L3-4 interspace was the level of puncture, but was actually L2-3. Under stringent sterile technique, overlying skin was cleansed with betadine soap and anesthetized with 1% lidocaine without epinephrine. A 20 gauge needle was advanced using loss-of-resistance technique. DIAGNOSTIC EPIDURAL INJECTION Injection of Isovue-M 200 shows a good epidural pattern with spread above and below the level of needle placement, primarily on the side of needle placement. No vascular or subarachnoid opacification is seen. THERAPEUTIC EPIDURAL INJECTION 15 ml of the patient's blood was injected into the epidural space at the site of prior lumbar puncture. IMPRESSION: Technically successful lumbar blood patch at L2-L3. See comments above. Electronically Signed   By: Staci Righter M.D.   On: 11/05/2016 14:13    Assessment & Plan:   Audrey Russell was seen today for sinus  problem.  Diagnoses and all orders for this visit:  Opacity of lung on imaging study -     DG Chest 2 View; Future -     DG Chest 2 View -     CT CHEST W WO CONTRAST; Future  Acute recurrent pansinusitis -     predniSONE (STERAPRED UNI-PAK 21 TAB) 10 MG (21) TBPK tablet; As directed on package -     sodium chloride (OCEAN) 0.65 % SOLN nasal spray; Place 1 spray into both nostrils as needed for congestion. -     cetirizine (ZYRTEC) 10 MG tablet; Take 1 tablet (10 mg total) by mouth at bedtime. -     amoxicillin-clavulanate (AUGMENTIN) 875-125 MG tablet; Take 1 tablet by mouth 2 (two) times daily. -     saccharomyces boulardii (FLORASTOR) 250 MG capsule; Take 1 capsule (250 mg total) by mouth 2 (two) times daily. -     guaiFENesin (MUCINEX) 600 MG 12 hr tablet; Take 1 tablet (600 mg total) by mouth daily.   I am having  Audrey Russell start on predniSONE, sodium chloride, cetirizine, amoxicillin-clavulanate, saccharomyces boulardii, and guaiFENesin. I am also having her maintain her acetaminophen, citalopram, lisinopril, ranitidine, sitaGLIPtin-metformin, fenofibrate, ezetimibe, fluticasone, lamoTRIgine, gabapentin, mirtazapine, and rosuvastatin.  Meds ordered this encounter  Medications  . predniSONE (STERAPRED UNI-PAK 21 TAB) 10 MG (21) TBPK tablet    Sig: As directed on package    Dispense:  21 tablet    Refill:  0    Order Specific Question:   Supervising Provider    Answer:   Lucille Passy [[id number]]  . sodium chloride (OCEAN) 0.65 % SOLN nasal spray    Sig: Place 1 spray into both nostrils as needed for congestion.    Dispense:  15 mL    Refill:  0    Order Specific Question:   Supervising Provider    Answer:   Lucille Passy [[id number]]  . cetirizine (ZYRTEC) 10 MG tablet    Sig: Take 1 tablet (10 mg total) by mouth at bedtime.    Dispense:  30 tablet    Refill:  1    Order Specific Question:   Supervising Provider    Answer:   Lucille Passy [[id number]]  . amoxicillin-clavulanate  (AUGMENTIN) 875-125 MG tablet    Sig: Take 1 tablet by mouth 2 (two) times daily.    Dispense:  20 tablet    Refill:  0    Order Specific Question:   Supervising Provider    Answer:   Lucille Passy [[id number]]  . saccharomyces boulardii (FLORASTOR) 250 MG capsule    Sig: Take 1 capsule (250 mg total) by mouth 2 (two) times daily.    Order Specific Question:   Supervising Provider    Answer:   Lucille Passy [[id number]]  . guaiFENesin (MUCINEX) 600 MG 12 hr tablet    Sig: Take 1 tablet (600 mg total) by mouth daily.    Dispense:  30 tablet    Refill:  0    Order Specific Question:   Supervising Provider    Answer:   Lucille Passy [[id number]]    Follow-up: Return if symptoms worsen or fail to improve.  Wilfred Lacy, NP

## 2017-05-06 NOTE — Patient Instructions (Addendum)
Use saline sinus irrigation once a day.  Use mucinex once a day.  Push oral hydration with water.  Strongly encourage to quit tobacco use.  Persistent RUL opacities, which could be mass or scar tissue. Ordered CT chest. You will be called to schedule appt.

## 2017-05-07 ENCOUNTER — Encounter: Payer: Self-pay | Admitting: Internal Medicine

## 2017-05-07 ENCOUNTER — Other Ambulatory Visit: Payer: Self-pay

## 2017-05-07 ENCOUNTER — Ambulatory Visit (AMBULATORY_SURGERY_CENTER): Payer: PPO | Admitting: Internal Medicine

## 2017-05-07 VITALS — BP 115/77 | HR 75 | Temp 98.6°F | Resp 12 | Ht 69.0 in | Wt 165.0 lb

## 2017-05-07 DIAGNOSIS — D120 Benign neoplasm of cecum: Secondary | ICD-10-CM | POA: Diagnosis not present

## 2017-05-07 DIAGNOSIS — D123 Benign neoplasm of transverse colon: Secondary | ICD-10-CM | POA: Diagnosis not present

## 2017-05-07 DIAGNOSIS — Z1211 Encounter for screening for malignant neoplasm of colon: Secondary | ICD-10-CM | POA: Diagnosis not present

## 2017-05-07 MED ORDER — SODIUM CHLORIDE 0.9 % IV SOLN: 500.0000 mL | Freq: Once | INTRAVENOUS | Status: DC

## 2017-05-07 NOTE — Patient Instructions (Signed)
YOU HAD AN ENDOSCOPIC PROCEDURE TODAY AT [facility]:   Refer to the procedure report that was given to you for any specific questions about what was found during the examination.  If the procedure report does not answer your questions, please call your gastroenterologist to clarify.  If you requested that your care partner not be given the details of your procedure findings, then the procedure report has been included in a sealed envelope for you to review at your convenience later.  YOU SHOULD EXPECT: Some feelings of bloating in the abdomen. Passage of more gas than usual.  Walking can help get rid of the air that was put into your GI tract during the procedure and reduce the bloating. If you had a lower endoscopy (such as a colonoscopy or flexible sigmoidoscopy) you may notice spotting of blood in your stool or on the toilet paper. If you underwent a bowel prep for your procedure, you may not have a normal bowel movement for a few days.  Please Note:  You might notice some irritation and congestion in your nose or some drainage.  This is from the oxygen used during your procedure.  There is no need for concern and it should clear up in a day or so.  SYMPTOMS TO REPORT IMMEDIATELY:   Following lower endoscopy (colonoscopy or flexible sigmoidoscopy):  Excessive amounts of blood in the stool  Significant tenderness or worsening of abdominal pains  Swelling of the abdomen that is new, acute  Fever of 100F or higher  For urgent or emergent issues, a gastroenterologist can be reached at any hour by calling [phone].   DIET:  We do recommend a small meal at first, but then you may proceed to your regular diet.  Drink plenty of fluids but you should avoid alcoholic beverages for 24 hours.  ACTIVITY:  You should plan to take it easy for the rest of today and you should NOT DRIVE or use heavy machinery until tomorrow (because of the sedation medicines used during the test).     FOLLOW UP: Our staff will call the number listed on your records the next business day following your procedure to check on you and address any questions or concerns that you may have regarding the information given to you following your procedure. If we do not reach you, we will leave a message.  However, if you are feeling well and you are not experiencing any problems, there is no need to return our call.  We will assume that you have returned to your regular daily activities without incident.  If any biopsies were taken you will be contacted by phone or by letter within the next 1-3 weeks.  Please call us at [phone] if you have not heard about the biopsies in 3 weeks.   Await for biopsy results to determine next repeat Colonoscopy screening Polyps (handout given) Hemorrhoids (handout given)   SIGNATURES/CONFIDENTIALITY: You and/or your care partner have signed paperwork which will be entered into your electronic medical record.  These signatures attest to the fact that that the information above on your After Visit Summary has been reviewed and is understood.  Full responsibility of the confidentiality of this discharge information lies with you and/or your care-partner.

## 2017-05-07 NOTE — Addendum Note (Signed)
Addended by: Wilfred Lacy L on: 05/07/2017 01:12 PM   Modules accepted: Orders

## 2017-05-07 NOTE — Progress Notes (Signed)
No changes in medical or surgical hx since PV per pt

## 2017-05-07 NOTE — Progress Notes (Signed)
Called to room to assist during endoscopic procedure.  Patient ID and intended procedure confirmed with present staff. Received instructions for my participation in the procedure from the performing physician.  

## 2017-05-07 NOTE — Op Note (Signed)
[name] Procedure Date: 05/07/2017 11:33 AM MRN: [id number] Endoscopist: Jerene Bears , MD Age: 62 Referring MD:  Date of Birth: [birth year] Gender: Female Account #: [id number] Procedure:                Colonoscopy Indications:              Screening for colorectal malignant neoplasm, Last                            colonoscopy 10 years ago Medicines:                Monitored Anesthesia Care Procedure:                Pre-Anesthesia Assessment:                           - Prior to the procedure, a History and Physical                            was performed, and patient medications and                            allergies were reviewed. The patient's tolerance of                            previous anesthesia was also reviewed. The risks                            and benefits of the procedure and the sedation                            options and risks were discussed with the patient.                            All questions were answered, and informed consent                            was obtained. Prior Anticoagulants: The patient has                            taken no previous anticoagulant or antiplatelet                            agents. ASA Grade Assessment: II - A patient with                            mild systemic disease. After reviewing the risks                            and benefits, the patient was deemed in                            satisfactory condition to undergo the procedure.  After obtaining informed consent, the colonoscope                            was passed under direct vision. Throughout the                            procedure, the patient's blood pressure, pulse, and                            oxygen saturations were monitored continuously. The                            Colonoscope was introduced through the anus and                            advanced to the the cecum,  identified by                            appendiceal orifice and ileocecal valve. The                            colonoscopy was performed without difficulty. The                            patient tolerated the procedure well. The quality                            of the bowel preparation was good. The ileocecal                            valve, appendiceal orifice, and rectum were                            photographed. Scope In: 11:43:49 AM Scope Out: 12:02:16 PM Scope Withdrawal Time: 0 hours 13 minutes 33 seconds  Total Procedure Duration: 0 hours 18 minutes 27 seconds  Findings:                 The digital rectal exam was normal.                           A 1 mm polyp was found in the cecum. The polyp was                            sessile. The polyp was removed with a cold biopsy                            forceps. Resection and retrieval were complete.                           Five sessile polyps were found in the transverse                            colon. The polyps were 4 to 7 mm in  size. These                            polyps were removed with a cold snare. Resection                            and retrieval were complete.                           Internal hemorrhoids were found during                            retroflexion. The hemorrhoids were medium-sized. Complications:            No immediate complications. Estimated Blood Loss:     Estimated blood loss was minimal. Impression:               - One 1 mm polyp in the cecum, removed with a cold                            biopsy forceps. Resected and retrieved.                           - Five 4 to 7 mm polyps in the transverse colon,                            removed with a cold snare. Resected and retrieved.                           - Internal hemorrhoids. Recommendation:           - Patient has a contact number available for                            emergencies. The signs and symptoms of potential                             delayed complications were discussed with the                            patient. Return to normal activities tomorrow.                            Written discharge instructions were provided to the                            patient.                           - Resume previous diet.                           - Continue present medications.                           - Await pathology results.                           -  Repeat colonoscopy is recommended. The                            colonoscopy date will be determined after pathology                            results from today's exam become available for                            review. Jerene Bears, MD 05/07/2017 12:05:47 PM This report has been signed electronically.

## 2017-05-07 NOTE — Progress Notes (Signed)
A and O x3. Report to RN. Tolerated MAC anesthesia well.

## 2017-05-08 ENCOUNTER — Other Ambulatory Visit: Payer: Self-pay | Admitting: Nurse Practitioner

## 2017-05-08 ENCOUNTER — Telehealth: Payer: Self-pay | Admitting: *Deleted

## 2017-05-08 DIAGNOSIS — F339 Major depressive disorder, recurrent, unspecified: Secondary | ICD-10-CM

## 2017-05-08 NOTE — Telephone Encounter (Signed)
  Follow up Call-  Call back number 05/07/2017  Post procedure Call Back phone  # [phone]  Permission to leave phone message Yes  Some recent data might be hidden     Patient questions:  Do you have a fever, pain , or abdominal swelling? No. Pain Score  0 *  Have you tolerated food without any problems? Yes.    Have you been able to return to your normal activities? Yes.    Do you have any questions about your discharge instructions: Diet   No. Medications  No. Follow up visit  No.  Do you have questions or concerns about your Care? No.  Actions: * If pain score is 4 or above: No action needed, pain <4.

## 2017-05-14 ENCOUNTER — Inpatient Hospital Stay: Admission: RE | Admit: 2017-05-14 | Payer: Self-pay | Source: Ambulatory Visit

## 2017-05-14 ENCOUNTER — Encounter: Payer: Self-pay | Admitting: Internal Medicine

## 2017-05-22 ENCOUNTER — Inpatient Hospital Stay: Admission: RE | Admit: 2017-05-22 | Payer: Self-pay | Source: Ambulatory Visit

## 2017-06-10 ENCOUNTER — Other Ambulatory Visit (HOSPITAL_COMMUNITY): Payer: Self-pay | Admitting: Psychiatry

## 2017-06-10 DIAGNOSIS — F339 Major depressive disorder, recurrent, unspecified: Secondary | ICD-10-CM

## 2017-06-11 ENCOUNTER — Other Ambulatory Visit (INDEPENDENT_AMBULATORY_CARE_PROVIDER_SITE_OTHER): Payer: PPO

## 2017-06-11 DIAGNOSIS — R918 Other nonspecific abnormal finding of lung field: Secondary | ICD-10-CM | POA: Diagnosis not present

## 2017-06-11 LAB — BASIC METABOLIC PANEL
BUN: 7 mg/dL (ref 6–23)
CHLORIDE: 108 meq/L (ref 96–112)
CO2: 24 meq/L (ref 19–32)
Calcium: 9.6 mg/dL (ref 8.4–10.5)
Creatinine, Ser: 0.77 mg/dL (ref 0.40–1.20)
GFR: 97.87 mL/min (ref >60.00)
GLUCOSE: 107 mg/dL — AB (ref 70–99)
POTASSIUM: 4.2 meq/L (ref 3.5–5.1)
SODIUM: 139 meq/L (ref 135–145)

## 2017-06-12 ENCOUNTER — Inpatient Hospital Stay: Admission: RE | Admit: 2017-06-12 | Payer: Self-pay | Source: Ambulatory Visit

## 2017-06-12 ENCOUNTER — Telehealth: Payer: Self-pay | Admitting: Nurse Practitioner

## 2017-06-12 ENCOUNTER — Other Ambulatory Visit: Payer: Self-pay | Admitting: Nurse Practitioner

## 2017-06-12 ENCOUNTER — Ambulatory Visit (INDEPENDENT_AMBULATORY_CARE_PROVIDER_SITE_OTHER)
Admission: RE | Admit: 2017-06-12 | Discharge: 2017-06-12 | Disposition: A | Discharge status: Home / Self Care | Payer: PPO | Source: Ambulatory Visit | Attending: Nurse Practitioner | Admitting: Nurse Practitioner

## 2017-06-12 DIAGNOSIS — K769 Liver disease, unspecified: Secondary | ICD-10-CM

## 2017-06-12 DIAGNOSIS — R918 Other nonspecific abnormal finding of lung field: Secondary | ICD-10-CM

## 2017-06-12 DIAGNOSIS — J439 Emphysema, unspecified: Secondary | ICD-10-CM | POA: Diagnosis not present

## 2017-06-12 MED ORDER — IOPAMIDOL (ISOVUE-300) INJECTION 61%
80.0000 mL | Freq: Once | INTRAVENOUS | Status: AC | PRN
  Administered 2017-06-12: 80 mL via INTRAVENOUS

## 2017-06-12 NOTE — Telephone Encounter (Signed)
Truman Hayward from [facility] called to give report regarding CT of the chest with contrast ordered by Wilfred Lacy NP. Called PCP and notified of impression.  IMPRESSION: 1. Indeterminate irregular 3.4 cm cavitary lesion in the peripheral right upper lobe with 8 mm solid mural nodular component. Primary bronchogenic malignancy cannot be excluded. Solitary mildly enlarged posterior mediastinal node between the esophagus and descending thoracic aorta, nonspecific. PET-CT is suggested for further characterization of these findings. 2. Vague 1.6 cm hypervascular focus in the segment 8 right liver lobe, indeterminate. This focus would be best characterized at MRI abdomen without and with IV contrast. 3. One vessel coronary atherosclerosis.

## 2017-06-13 ENCOUNTER — Other Ambulatory Visit: Payer: Self-pay | Admitting: Nurse Practitioner

## 2017-06-13 DIAGNOSIS — F339 Major depressive disorder, recurrent, unspecified: Secondary | ICD-10-CM

## 2017-06-13 NOTE — Telephone Encounter (Signed)
Pt is aware.  

## 2017-06-14 ENCOUNTER — Other Ambulatory Visit (HOSPITAL_COMMUNITY): Payer: Self-pay | Admitting: Psychiatry

## 2017-06-14 DIAGNOSIS — F339 Major depressive disorder, recurrent, unspecified: Secondary | ICD-10-CM

## 2017-06-15 ENCOUNTER — Other Ambulatory Visit (HOSPITAL_COMMUNITY): Payer: Self-pay | Admitting: Psychiatry

## 2017-06-15 DIAGNOSIS — F339 Major depressive disorder, recurrent, unspecified: Secondary | ICD-10-CM

## 2017-06-17 ENCOUNTER — Telehealth: Payer: Self-pay | Admitting: Nurse Practitioner

## 2017-06-17 DIAGNOSIS — R1084 Generalized abdominal pain: Secondary | ICD-10-CM

## 2017-06-17 NOTE — Telephone Encounter (Signed)
Copied from [place] [id number]. Topic: Quick Communication - Rx Refill/Question >> Jun 17, 2017 12:52 PM Scherrie Gerlach wrote: Medication: ranitidine (ZANTAC) 300 MG tablet Has the patient contacted their pharmacy? yes  But we did not receive [id number] [address], [place] [phone] (Phone) [phone] (Fax)

## 2017-06-18 MED ORDER — RANITIDINE HCL 300 MG PO TABS: 300.0000 mg | ORAL_TABLET | Freq: Every day | ORAL | 1 refills | Status: DC

## 2017-06-19 ENCOUNTER — Ambulatory Visit (HOSPITAL_COMMUNITY): Payer: PPO | Admitting: Psychiatry

## 2017-06-25 ENCOUNTER — Other Ambulatory Visit: Payer: Self-pay | Admitting: Nurse Practitioner

## 2017-06-25 ENCOUNTER — Other Ambulatory Visit (HOSPITAL_COMMUNITY): Payer: Self-pay

## 2017-06-25 DIAGNOSIS — F339 Major depressive disorder, recurrent, unspecified: Secondary | ICD-10-CM

## 2017-06-25 MED ORDER — GABAPENTIN 300 MG PO CAPS: 300.0000 mg | ORAL_CAPSULE | Freq: Every day | ORAL | 0 refills | Status: DC

## 2017-06-25 MED ORDER — LAMOTRIGINE 100 MG PO TABS: 100.0000 mg | ORAL_TABLET | Freq: Every day | ORAL | 0 refills | Status: DC

## 2017-06-25 MED ORDER — CITALOPRAM HYDROBROMIDE 40 MG PO TABS: 40.0000 mg | ORAL_TABLET | Freq: Every day | ORAL | 0 refills | Status: DC

## 2017-06-25 NOTE — Telephone Encounter (Signed)
Copied from [place] [id number]. Topic: Quick Communication - Rx Refill/Question >> Jun 25, 2017  9:07 AM Robina Ade, Helene Kelp D wrote: Medication: citalopram (CELEXA) 40 MG tablet  Has the patient contacted their pharmacy? Yes (Agent: If no, request that the patient contact the pharmacy for the refill.) (Agent: If yes, when and what did the pharmacy advise?)  Preferred Pharmacy (with phone number or street name): [id number] - [place], [place]  Agent: Please be advised that RX refills may take up to 3 business days. We ask that you follow-up with your pharmacy.

## 2017-06-26 ENCOUNTER — Other Ambulatory Visit (INDEPENDENT_AMBULATORY_CARE_PROVIDER_SITE_OTHER): Payer: PPO

## 2017-06-26 ENCOUNTER — Ambulatory Visit: Payer: PPO | Admitting: Internal Medicine

## 2017-06-26 ENCOUNTER — Encounter: Payer: Self-pay | Admitting: Internal Medicine

## 2017-06-26 VITALS — BP 88/60 | HR 89 | Ht 69.0 in | Wt 164.0 lb

## 2017-06-26 DIAGNOSIS — R918 Other nonspecific abnormal finding of lung field: Secondary | ICD-10-CM | POA: Diagnosis not present

## 2017-06-26 DIAGNOSIS — R0609 Other forms of dyspnea: Secondary | ICD-10-CM | POA: Diagnosis not present

## 2017-06-26 DIAGNOSIS — F17210 Nicotine dependence, cigarettes, uncomplicated: Secondary | ICD-10-CM

## 2017-06-26 DIAGNOSIS — I10 Essential (primary) hypertension: Secondary | ICD-10-CM | POA: Diagnosis not present

## 2017-06-26 LAB — CBC WITH DIFFERENTIAL/PLATELET
BASOS PCT: 1 % (ref 0.0–3.0)
Basophils Absolute: 0.1 10*3/uL (ref 0.0–0.1)
EOS ABS: 0.1 10*3/uL (ref 0.0–0.7)
Eosinophils Relative: 1.6 % (ref 0.0–5.0)
HCT: 41.3 % (ref 36.0–46.0)
Hemoglobin: 13.8 g/dL (ref 12.0–15.0)
LYMPHS ABS: 2.9 10*3/uL (ref 0.7–4.0)
Lymphocytes Relative: 34.4 % (ref 12.0–46.0)
MCHC: 33.4 g/dL (ref 30.0–36.0)
MCV: 84.8 fl (ref 78.0–100.0)
Monocytes Absolute: 0.7 10*3/uL (ref 0.1–1.0)
Monocytes Relative: 7.9 % (ref 3.0–12.0)
NEUTROS ABS: 4.6 10*3/uL (ref 1.4–7.7)
Neutrophils Relative %: 55.1 % (ref 43.0–77.0)
PLATELETS: 269 10*3/uL (ref 150.0–400.0)
RBC: 4.87 Mil/uL (ref 3.87–5.11)
RDW: 14.2 % (ref 11.5–15.5)
WBC: 8.4 10*3/uL (ref 4.0–10.5)

## 2017-06-26 LAB — SEDIMENTATION RATE: SED RATE: 37 mm/h — AB (ref 0–30)

## 2017-06-26 NOTE — Progress Notes (Signed)
Subjective:     Patient ID: Audrey Russell, female   DOB: 07-17-1955,     MRN: [id number]  HPI  46 yobf from [place]  active smoker  Moved to [place] 2016 with cough/doe x fall   2017referred to pulmonary clinic 06/26/2017 by Dr   Lorayne Marek with wt loss, night sweats and cavitary lesion in RUL pos segment.    06/26/2017 1st [facility] office visit/ Audrey Russell   Chief Complaint  Patient presents with  . Pulmonary Consult    Referred by Dr. Wilfred Lacy for eval of abnormal ct chest done 06/12/17.  She states she has been having SOB for the past 1-2 yrs. She states she "coughs constantly"- started back in Sept 2018 but has been getting progressively worse. Her cough is occ prod with clear to yellow sputum.    initially started with cough fall 2017 thick yellow / green phlegm no better with abx and never bloody worse in early am and up to a small cup in vol in am reported   Doe= MMRC3 = can't walk 100 yards even at a slow pace at a flat grade s stopping due to sob      NO  obvious day to day or daytime variability or assoc mucus plugs or hemoptysis or cp or chest tightness, subjective wheeze or overt sinus or hb symptoms. No unusual exposure hx or h/o childhood pna/ asthma or knowledge of premature birth.   . Also denies any obvious fluctuation of symptoms with weather or environmental changes or other aggravating or alleviating factors except as outlined above   Current Allergies, Complete Past Medical History, Past Surgical History, Family History, and Social History were reviewed in [employer] record.  ROS  The following are not active complaints unless bolded Hoarseness, sore throat, dysphagia, dental problems, itching, sneezing,  nasal congestion or discharge of excess mucus or purulent secretions, ear ache,   fever, chills, sweats, unintended wt loss x 5 -10 lb or wt gain, classically pleuritic or exertional cp,  orthopnea pnd or leg swelling, presyncope,  palpitations, abdominal pain, anorexia, nausea, vomiting, diarrhea  or change in bowel habits or change in bladder habits, change in stools or change in urine, dysuria, hematuria,  rash, arthralgias, visual complaints, headache, numbness, weakness or ataxia or problems with walking or coordination,  change in mood/affect or memory.        Current Meds  Medication Sig  . acetaminophen (TYLENOL) 500 MG tablet Take 1,000 mg by mouth every 6 (six) hours as needed for mild pain or moderate pain.  . cetirizine (ZYRTEC) 10 MG tablet Take 1 tablet (10 mg total) by mouth at bedtime.  . citalopram (CELEXA) 40 MG tablet Take 1 tablet (40 mg total) by mouth daily.  Marland Kitchen ezetimibe (ZETIA) 10 MG tablet Take 1 tablet (10 mg total) by mouth every evening.  . fenofibrate (TRICOR) 145 MG tablet Take 1 tablet (145 mg total) by mouth daily.  . fluticasone (FLONASE) 50 MCG/ACT nasal spray Place 2 sprays into both nostrils daily.  Marland Kitchen gabapentin (NEURONTIN) 300 MG capsule Take 1 capsule (300 mg total) by mouth at bedtime.  . lamoTRIgine (LAMICTAL) 100 MG tablet Take 1 tablet (100 mg total) by mouth daily.  Marland Kitchen lisinopril (PRINIVIL,ZESTRIL) 10 MG tablet Take 1 tablet (10 mg total) by mouth daily.  . mirtazapine (REMERON) 30 MG tablet TAKE 1 TABLET (30 MG TOTAL) BY MOUTH AT BEDTIME.  . ranitidine (ZANTAC) 300 MG tablet Take 1 tablet (300 mg  total) by mouth at bedtime.  . rosuvastatin (CRESTOR) 40 MG tablet Take 40 mg by mouth daily.  Marland Kitchen saccharomyces boulardii (FLORASTOR) 250 MG capsule Take 1 capsule (250 mg total) by mouth 2 (two) times daily.  . sodium chloride (OCEAN) 0.65 % SOLN nasal spray Place 1 spray into both nostrils as needed for congestion.     Review of Systems     Objective:   Physical Exam amb bf nad   Wt Readings from Last 3 Encounters:  06/26/17 164 lb (74.4 kg)  05/07/17 165 lb (74.8 kg)  05/06/17 164 lb 6.4 oz (74.6 kg)     Vital signs reviewed - Note on arrival 02 sats  97% on RA  And bp  88/60 p am lisinopril   HEENT: nl dentition / oropharynx. Nl external ear canals without cough reflex - moderate bilateral non-specific turbinate edema     NECK :  without JVD/Nodes/TM/ nl carotid upstrokes bilaterally   LUNGS: no acc muscle use,  Mid barrel  contour chest wall with bilateral  Distant bs s audible wheeze and  without cough on insp or exp maneuver and mild   Hyperresonant  to  percussion bilaterally     CV:  RRR  no s3 or murmur or increase in P2, and no edema   ABD:  soft and nontender with pos  Late  insp Hoover's  in the supine position. No bruits or organomegaly appreciated, bowel sounds nl  MS:   Nl gait/  ext warm without deformities, calf tenderness, cyanosis or clubbing No obvious joint restrictions   SKIN: warm and dry without lesions    NEURO:  alert, approp, nl sensorium with  no motor or cerebellar deficits apparent.       I personally reviewed images and agree with radiology impression as follows:   Chest CT w contrast  06/12/17  1. Indeterminate irregular 3.4 cm cavitary lesion in the peripheral right upper lobe with 8 mm solid mural nodular component. Primary bronchogenic malignancy cannot be excluded. Solitary mildly enlarged posterior mediastinal node between the esophagus and descending thoracic aorta, nonspecific. PET-CT is suggested for further characterization of these findings. 2. Vague 1.6 cm hypervascular focus in the segment 8 right liver lobe, indeterminate. This focus would be best characterized at MRI abdomen without and with IV contrast.   Labs ordered 06/26/2017   TSH, esr, Quant GOLD TB and am sputum for afb/ cbc with diff      Assessment:

## 2017-06-26 NOTE — Patient Instructions (Addendum)
Stop lisinopril   The key is to stop smoking completely before smoking completely stops you!   Please remember to go to the lab department downstairs in the basement  for your tests - we will call you with the results when they are available.      Bring mucus in am to lab for tb testing tomorrow (best to let someone else drive it over as soon as you can in am)    Stay home until we contact you and if you have to go out for any reason wear a mask.

## 2017-06-27 ENCOUNTER — Encounter: Payer: Self-pay | Admitting: Internal Medicine

## 2017-06-27 DIAGNOSIS — R0609 Other forms of dyspnea: Secondary | ICD-10-CM

## 2017-06-27 DIAGNOSIS — F17210 Nicotine dependence, cigarettes, uncomplicated: Secondary | ICD-10-CM

## 2017-06-27 DIAGNOSIS — Z87891 Personal history of nicotine dependence: Secondary | ICD-10-CM | POA: Insufficient documentation

## 2017-06-27 LAB — TSH: TSH: 1.07 u[IU]/mL (ref 0.35–4.50)

## 2017-06-27 NOTE — Assessment & Plan Note (Signed)
ACE inhibitors are problematic in  pts with airway complaints because  even experienced pulmonologists can't always distinguish ace effects from copd/asthma.  By themselves they don't actually cause a problem, much like oxygen can't by itself start a fire, but they certainly serve as a powerful catalyst or enhancer for any "fire"  or inflammatory process in the upper airway, be it caused by an ET  tube or more commonly reflux (especially in the obese or pts with known GERD or who are on biphoshonates).    Since bp too low will ask her to stop acei for now and if needed recstart back on ARB and avoid acei all together to keep from muddying the waters in terms of symptom assessment going forward.

## 2017-06-27 NOTE — Assessment & Plan Note (Signed)
06/26/2017 Quant GOLD TB  06/27/2017 sputum for afb  ddx is TB vs Ca and favor the former based on typical location and assoc refractory productive and purulent sputum. Lung abscess less likely with no air fluid level but also in ddx.  rec first rule out TB then consider PET / fob   Discussed in detail all the  indications, usual  risks and alternatives  relative to the benefits with patient who agrees to proceed with w/u as outlined.      Total time devoted to counseling  > 50 % of initial 60 min office visit:  review case with pt/ discussion of options/alternatives/ personally creating written customized instructions  in presence of pt  then going over those specific  Instructions directly with the pt including how to use all of the meds but in particular covering each new medication in detail and the difference between the maintenance= "automatic" meds and the prns using an action plan format for the latter (If this problem/symptom => do that organization reading Left to right).  Please see AVS from this visit for a full list of these instructions which I personally wrote for this pt and  are unique to this visit.

## 2017-06-27 NOTE — Assessment & Plan Note (Signed)
4-5 min discussion re active cigarette smoking in addition to office E&M  Ask about tobacco use:  Active  Advise quitting     I emphasized that although we never turn away smokers from the pulmonary clinic, we do ask that they understand that the recommendations that we make  won't work nearly as well in the presence of continued cigarette exposure. In fact, we may very well  reach a point where we can't promise to help the patient if he/she can't quit smoking. (We can and will promise to try to help, we just can't promise what we recommend will really work)  Assess willingness Cutting down but not ready to quit Assist in quit attempt when ready per pcp Arrange follow up. Per pcp

## 2017-06-27 NOTE — Assessment & Plan Note (Addendum)
Most likely has element of mod copd/ cb still smoking (see separate a/p)   Will w/u once we've excluded active cavitary tb but for now needs to stay home and not have any additional testing

## 2017-06-28 LAB — QUANTIFERON-TB GOLD PLUS
Mitogen-NIL: >10 IU/mL
NIL: 0.04 IU/mL
QUANTIFERON-TB GOLD PLUS: NEGATIVE
TB1-NIL: 0 [IU]/mL
TB2-NIL: 0 IU/mL

## 2017-06-30 ENCOUNTER — Telehealth: Payer: Self-pay | Admitting: Internal Medicine

## 2017-06-30 NOTE — Telephone Encounter (Signed)
Returned call to Patient.  Results and recommendations given.  Patient stated understanding and that she was bringing sputum sample in the morning 07/01/17 to lab.  Nothing further at this time.

## 2017-07-02 ENCOUNTER — Other Ambulatory Visit: Payer: PPO

## 2017-07-02 DIAGNOSIS — R918 Other nonspecific abnormal finding of lung field: Secondary | ICD-10-CM | POA: Diagnosis not present

## 2017-07-03 ENCOUNTER — Other Ambulatory Visit (HOSPITAL_COMMUNITY): Payer: Self-pay | Admitting: Psychiatry

## 2017-07-07 ENCOUNTER — Other Ambulatory Visit: Payer: Self-pay | Admitting: Internal Medicine

## 2017-07-07 DIAGNOSIS — R918 Other nonspecific abnormal finding of lung field: Secondary | ICD-10-CM

## 2017-07-07 NOTE — Progress Notes (Signed)
Spoke with pt and notified of results per Dr. Melvyn Novas. Pt verbalized understanding and denied any questions.PET ordered.

## 2017-07-08 ENCOUNTER — Ambulatory Visit (HOSPITAL_COMMUNITY): Payer: PPO | Admitting: Psychiatry

## 2017-07-11 ENCOUNTER — Other Ambulatory Visit (HOSPITAL_COMMUNITY): Payer: Self-pay | Admitting: Psychiatry

## 2017-07-14 ENCOUNTER — Encounter (HOSPITAL_COMMUNITY): Payer: PPO

## 2017-07-16 ENCOUNTER — Encounter (HOSPITAL_COMMUNITY)
Admission: RE | Admit: 2017-07-16 | Discharge: 2017-07-16 | Disposition: A | Discharge status: Home / Self Care | Payer: PPO | Source: Ambulatory Visit | Attending: Internal Medicine | Admitting: Internal Medicine

## 2017-07-16 DIAGNOSIS — R911 Solitary pulmonary nodule: Secondary | ICD-10-CM | POA: Diagnosis not present

## 2017-07-16 DIAGNOSIS — R918 Other nonspecific abnormal finding of lung field: Secondary | ICD-10-CM | POA: Diagnosis not present

## 2017-07-16 LAB — GLUCOSE, CAPILLARY: GLUCOSE-CAPILLARY: 230 mg/dL — AB (ref 70–99)

## 2017-07-16 MED ORDER — FLUDEOXYGLUCOSE F - 18 (FDG) INJECTION
7.1000 | Freq: Once | INTRAVENOUS | Status: AC
  Administered 2017-07-16: 7.1 via INTRAVENOUS

## 2017-07-17 ENCOUNTER — Telehealth: Payer: Self-pay | Admitting: Internal Medicine

## 2017-07-17 NOTE — Telephone Encounter (Signed)
Patient returned call, CB is [phone]

## 2017-07-17 NOTE — Telephone Encounter (Signed)
Attempted to call patient today regarding results. I did not receive an answer at time of call. I have left a voicemail message for pt to return call. X1  

## 2017-07-17 NOTE — Telephone Encounter (Signed)
Called and spoke with patient regarding results.  Informed the patient of results and recommendations today. Scheduled pt for PFT 07/22/2017 at 4pm. Pt verbalized understanding and denied any questions or concerns at this time.  Nothing further needed.

## 2017-07-17 NOTE — Progress Notes (Signed)
LMTCB

## 2017-07-21 ENCOUNTER — Other Ambulatory Visit: Payer: Self-pay | Admitting: Internal Medicine

## 2017-07-21 ENCOUNTER — Other Ambulatory Visit: Payer: Self-pay | Admitting: Nurse Practitioner

## 2017-07-21 DIAGNOSIS — R0609 Other forms of dyspnea: Principal | ICD-10-CM

## 2017-07-21 DIAGNOSIS — J0141 Acute recurrent pansinusitis: Secondary | ICD-10-CM

## 2017-07-22 ENCOUNTER — Ambulatory Visit (INDEPENDENT_AMBULATORY_CARE_PROVIDER_SITE_OTHER): Payer: PPO | Admitting: Internal Medicine

## 2017-07-22 DIAGNOSIS — R0609 Other forms of dyspnea: Secondary | ICD-10-CM

## 2017-07-22 LAB — PULMONARY FUNCTION TEST
DL/VA % pred: 50 %
DL/VA: 2.71 ml/min/mmHg/L
DLCO UNC % PRED: 37 %
DLCO UNC: 11.72 ml/min/mmHg
FEF 25-75 POST: 1.62 L/s
FEF 25-75 PRE: 1.5 L/s
FEF2575-%CHANGE-POST: 7 %
FEF2575-%PRED-POST: 68 %
FEF2575-%Pred-Pre: 63 %
FEV1-%Change-Post: 4 %
FEV1-%PRED-POST: 83 %
FEV1-%PRED-PRE: 79 %
FEV1-POST: 2.1 L
FEV1-Pre: 2 L
FEV1FVC-%CHANGE-POST: 0 %
FEV1FVC-%PRED-PRE: 89 %
FEV6-%Change-Post: 1 %
FEV6-%Pred-Post: 91 %
FEV6-%Pred-Pre: 90 %
FEV6-Post: 2.86 L
FEV6-Pre: 2.83 L
FEV6FVC-%CHANGE-POST: 0 %
FEV6FVC-%PRED-PRE: 102 %
FEV6FVC-%Pred-Post: 102 %
FVC-%CHANGE-POST: 4 %
FVC-%Pred-Post: 92 %
FVC-%Pred-Pre: 88 %
FVC-Post: 2.96 L
FVC-Pre: 2.84 L
POST FEV1/FVC RATIO: 71 %
PRE FEV6/FVC RATIO: 100 %
Post FEV6/FVC ratio: 100 %
Pre FEV1/FVC ratio: 71 %

## 2017-07-22 NOTE — Progress Notes (Signed)
PFT completed today.  

## 2017-07-25 ENCOUNTER — Other Ambulatory Visit: Payer: Self-pay | Admitting: Internal Medicine

## 2017-07-25 DIAGNOSIS — R918 Other nonspecific abnormal finding of lung field: Secondary | ICD-10-CM

## 2017-07-28 ENCOUNTER — Other Ambulatory Visit: Payer: Self-pay | Admitting: Nurse Practitioner

## 2017-07-28 DIAGNOSIS — E781 Pure hyperglyceridemia: Secondary | ICD-10-CM

## 2017-07-28 MED ORDER — FENOFIBRATE 145 MG PO TABS: 145.0000 mg | ORAL_TABLET | Freq: Every day | ORAL | 0 refills | Status: DC

## 2017-07-28 MED ORDER — EZETIMIBE 10 MG PO TABS
10.0000 mg | ORAL_TABLET | Freq: Every evening | ORAL | 0 refills | Status: DC
Start: 2017-07-28 — End: 2017-07-31

## 2017-07-28 NOTE — Telephone Encounter (Signed)
Rx sent. Pt is aware to make an appt with lipid clinic and to make an appt for CPE with [name] this month.

## 2017-07-31 ENCOUNTER — Encounter: Payer: Self-pay | Admitting: Nurse Practitioner

## 2017-07-31 ENCOUNTER — Ambulatory Visit (INDEPENDENT_AMBULATORY_CARE_PROVIDER_SITE_OTHER): Payer: PPO | Admitting: Nurse Practitioner

## 2017-07-31 VITALS — BP 106/72 | HR 76 | Temp 98.7°F | Ht 69.0 in | Wt 166.0 lb

## 2017-07-31 DIAGNOSIS — Z1231 Encounter for screening mammogram for malignant neoplasm of breast: Secondary | ICD-10-CM

## 2017-07-31 DIAGNOSIS — K219 Gastro-esophageal reflux disease without esophagitis: Secondary | ICD-10-CM

## 2017-07-31 DIAGNOSIS — Z23 Encounter for immunization: Secondary | ICD-10-CM | POA: Diagnosis not present

## 2017-07-31 DIAGNOSIS — I10 Essential (primary) hypertension: Secondary | ICD-10-CM

## 2017-07-31 DIAGNOSIS — E118 Type 2 diabetes mellitus with unspecified complications: Secondary | ICD-10-CM

## 2017-07-31 DIAGNOSIS — F339 Major depressive disorder, recurrent, unspecified: Secondary | ICD-10-CM

## 2017-07-31 DIAGNOSIS — R21 Rash and other nonspecific skin eruption: Secondary | ICD-10-CM | POA: Diagnosis not present

## 2017-07-31 DIAGNOSIS — E781 Pure hyperglyceridemia: Secondary | ICD-10-CM | POA: Diagnosis not present

## 2017-07-31 DIAGNOSIS — Z0001 Encounter for general adult medical examination with abnormal findings: Secondary | ICD-10-CM | POA: Diagnosis not present

## 2017-07-31 LAB — POCT GLYCOSYLATED HEMOGLOBIN (HGB A1C): HEMOGLOBIN A1C: 7.3 % — AB (ref 4.0–5.6)

## 2017-07-31 MED ORDER — MIRTAZAPINE 30 MG PO TABS: 30.0000 mg | ORAL_TABLET | Freq: Every day | ORAL | 6 refills | Status: DC

## 2017-07-31 MED ORDER — KETOCONAZOLE 2 % EX CREA
1.0000 | TOPICAL_CREAM | Freq: Two times a day (BID) | CUTANEOUS | 1 refills | Status: DC
Start: 2017-07-31 — End: 2018-08-11

## 2017-07-31 MED ORDER — FENOFIBRATE 145 MG PO TABS: 145.0000 mg | ORAL_TABLET | Freq: Every day | ORAL | 3 refills | Status: DC

## 2017-07-31 MED ORDER — RANITIDINE HCL 300 MG PO TABS: 300.0000 mg | ORAL_TABLET | Freq: Every day | ORAL | 3 refills | Status: DC

## 2017-07-31 MED ORDER — CITALOPRAM HYDROBROMIDE 40 MG PO TABS: 40.0000 mg | ORAL_TABLET | Freq: Every day | ORAL | 3 refills | Status: DC

## 2017-07-31 MED ORDER — EZETIMIBE 10 MG PO TABS: 10.0000 mg | ORAL_TABLET | Freq: Every evening | ORAL | 3 refills | Status: DC

## 2017-07-31 MED ORDER — METFORMIN HCL ER 500 MG PO TB24: 500.0000 mg | ORAL_TABLET | Freq: Every day | ORAL | 1 refills | Status: DC

## 2017-07-31 NOTE — Patient Instructions (Addendum)
Schedule appt for eye exam. Wear adequate footwear at all times.  Maintain appt with cardiothoracic surgeon and for AND MRI.  It is important that you make appt with lipid clinic due to very high levels of triglyceride.  You will be contacted to schedule appt for mammogram.  Resume metformin due to increasing hgbA1c to 7.3.   continue to decrease tobacco use.  Consider referral to dermatology if no improvement in scalp rash.  Diabetes and Foot Care Diabetes may cause you to have problems because of poor blood supply (circulation) to your feet and legs. This may cause the skin on your feet to become thinner, break easier, and heal more slowly. Your skin may become dry, and the skin may peel and crack. You may also have nerve damage in your legs and feet causing decreased feeling in them. You may not notice minor injuries to your feet that could lead to infections or more serious problems. Taking care of your feet is one of the most important things you can do for yourself. Follow these instructions at home:  Wear shoes at all times, even in the house. Do not go barefoot. Bare feet are easily injured.  Check your feet daily for blisters, cuts, and redness. If you cannot see the bottom of your feet, use a mirror or ask someone for help.  Wash your feet with warm water (do not use hot water) and mild soap. Then pat your feet and the areas between your toes until they are completely dry. Do not soak your feet as this can dry your skin.  Apply a moisturizing lotion or petroleum jelly (that does not contain alcohol and is unscented) to the skin on your feet and to dry, brittle toenails. Do not apply lotion between your toes.  Trim your toenails straight across. Do not dig under them or around the cuticle. File the edges of your nails with an emery board or nail file.  Do not cut corns or calluses or try to remove them with medicine.  Wear clean socks or stockings every day. Make sure they are  not too tight. Do not wear knee-high stockings since they may decrease blood flow to your legs.  Wear shoes that fit properly and have enough cushioning. To break in new shoes, wear them for just a few hours a day. This prevents you from injuring your feet. Always look in your shoes before you put them on to be sure there are no objects inside.  Do not cross your legs. This may decrease the blood flow to your feet.  If you find a minor scrape, cut, or break in the skin on your feet, keep it and the skin around it clean and dry. These areas may be cleansed with mild soap and water. Do not cleanse the area with peroxide, alcohol, or iodine.  When you remove an adhesive bandage, be sure not to damage the skin around it.  If you have a wound, look at it several times a day to make sure it is healing.  Do not use heating pads or hot water bottles. They may burn your skin. If you have lost feeling in your feet or legs, you may not know it is happening until it is too late.  Make sure your health care provider performs a complete foot exam at least annually or more often if you have foot problems. Report any cuts, sores, or bruises to your health care provider immediately. Contact a health care provider if:  You have an injury that is not healing.  You have cuts or breaks in the skin.  You have an ingrown nail.  You notice redness on your legs or feet.  You feel burning or tingling in your legs or feet.  You have pain or cramps in your legs and feet.  Your legs or feet are numb.  Your feet always feel cold. Get help right away if:  There is increasing redness, swelling, or pain in or around a wound.  There is a red line that goes up your leg.  Pus is coming from a wound.  You develop a fever or as directed by your health care provider.  You notice a bad smell coming from an ulcer or wound. This information is not intended to replace advice given to you by your health care  provider. Make sure you discuss any questions you have with your health care provider. Document Released: 01/05/2000 Document Revised: 06/15/2015 Document Reviewed: 06/16/2012 Elsevier Interactive Patient Education  2017 [employer].

## 2017-07-31 NOTE — Progress Notes (Signed)
Subjective:    Patient ID: Audrey Russell, female    DOB: Jun 17, 1955, 62 y.o.   MRN: [id number]  Patient presents today for complete physical  Rash  This is a chronic problem. The problem has been gradually worsening since onset. The affected locations include the scalp. The rash is characterized by redness, scaling and itchiness. It is unknown if there was an exposure to a precipitant. Associated symptoms include coughing and shortness of breath. Pertinent negatives include no fatigue or fever. Past treatments include nothing.   DM: No medication use at this time. Last hgbA1c 6.9. Janumet was discontinued 2017, unsure of reason? Reports bilateral foot itching.  Hypertriglyceride: Not at goal. Current use of tricor and zetia. Has not made appt with lipid clinic. Lipid Panel     Component Value Date/Time   CHOL 282 (H) 01/28/2017 1614   TRIG 597 (H) 01/28/2017 1614   HDL 36 (L) 01/28/2017 1614   CHOLHDL 7.8 (H) 01/28/2017 1614   LDLCALC  01/28/2017 1614     Comment:     . LDL cholesterol not calculated. Triglyceride levels greater than 400 mg/dL invalidate calculated LDL results. . Reference range: <100 . Desirable range <100 mg/dL for primary prevention;   <70 mg/dL for patients with CHD or diabetic patients  with > or = 2 CHD risk factors. Marland Kitchen LDL-C is now calculated using the Martin-Hopkins  calculation, which is a validated novel method providing  better accuracy than the Friedewald equation in the  estimation of LDL-C.  Cresenciano Genre et al. Annamaria Helling. 0258;527(78): 2061-2068  (http://education.[facility].com/faq/FAQ164)    LDLDIRECT 82.0 08/23/2016 1658    HTN:  controlled Lisinopril was held 06/2017 by pulmonology. Possible cause of cough? She reports cough has not changed. BP Readings from Last 3 Encounters:  07/31/17 106/72  06/26/17 (!) 88/60  05/07/17 115/77   Lung opacity and chronic cough: Followed by pulmonology: Dr.Wert. lisinopril was put on  hold. PFT (normal)  RUL mass: Possible malignancy Scheduled to talk to CTS: Dr. Piedad Climes   Tobacco use:  Decreased to 4-5cig per day.  Bipolar Disorder: Stable mood with lamictal gabapentin, celexa, and remeron. Managed by Dr. Adele Schilder.  Immunizations: (TDAP, Hep C screen, Pneumovax, Influenza, zoster)  Health Maintenance  Topic Date Due  . Pneumococcal vaccine (1) 01/05/1958  . Eye exam for diabetics  01/05/1966  . Mammogram  01/05/2006  . Hemoglobin A1C  07/28/2017  . HIV Screening  01/28/2018*  . Flu Shot  08/21/2017  . Urine Protein Check  01/28/2018  . Complete foot exam   08/01/2018  . Colon Cancer Screening  05/07/2020  . Tetanus Vaccine  01/29/2027  .  Hepatitis C: One time screening is recommended by Center for Disease Control  (CDC) for  adults born from [birth year] through 1965.   Completed  *Topic was postponed. The date shown is not the original due date.   Diet:regular.  Weight:  Wt Readings from Last 3 Encounters:  07/31/17 166 lb (75.3 kg)  06/26/17 164 lb (74.4 kg)  05/07/17 165 lb (74.8 kg)   Exercise:none.  Fall Risk: Fall Risk  04/04/2016  Falls in the past year? No   Home Safety:home with daughter.  Depression/Suicide: Depression screen [facility] 2/9 07/31/2017 04/04/2016  Decreased Interest 0 1  Down, Depressed, Hopeless 0 1  PHQ - 2 Score 0 2  Altered sleeping - 3  Tired, decreased energy - 3  Change in appetite - 1  Feeling bad or failure about yourself  - 0  Trouble  concentrating - 1  Moving slowly or fidgety/restless - 0  Suicidal thoughts - 0  PHQ-9 Score - 10   Vision:will schedule.  Dental:upper and lower dentures.  Advanced Directive: Advanced Directives 05/07/2017  Does Patient Have a Medical Advance Directive? No   Medications and allergies reviewed with patient and updated if appropriate.  Patient Active Problem List   Diagnosis Date Noted  . DOE (dyspnea on exertion) 06/27/2017  . Cigarette smoker 06/27/2017  . Pulmonary  infiltrates 06/26/2017  . Headache 11/04/2016  . Diarrhea 05/25/2016  . Abdominal pain 05/25/2016  . Bipolar affective ([place]) 05/24/2016  . MS (multiple sclerosis) ([place]) 05/24/2016  . Essential hypertension 04/04/2016  . DM (diabetes mellitus) ([place]) 04/04/2016  . Chronic pain 04/04/2016  . Depression 04/04/2016  . Hypertriglyceridemia 04/04/2016  . Leg weakness, bilateral 04/04/2016    Current Outpatient Medications on File Prior to Visit  Medication Sig Dispense Refill  . acetaminophen (TYLENOL) 500 MG tablet Take 1,000 mg by mouth every 6 (six) hours as needed for mild pain or moderate pain.    . cetirizine (ZYRTEC) 10 MG tablet TAKE 1 TABLET BY MOUTH EVERYDAY AT BEDTIME 30 tablet 1  . fluticasone (FLONASE) 50 MCG/ACT nasal spray Place 2 sprays into both nostrils daily. 16 g 3  . gabapentin (NEURONTIN) 300 MG capsule Take 1 capsule (300 mg total) by mouth at bedtime. 30 capsule 0  . lamoTRIgine (LAMICTAL) 100 MG tablet Take 1 tablet (100 mg total) by mouth daily. 30 tablet 0  . saccharomyces boulardii (FLORASTOR) 250 MG capsule Take 1 capsule (250 mg total) by mouth 2 (two) times daily.    . sodium chloride (OCEAN) 0.65 % SOLN nasal spray Place 1 spray into both nostrils as needed for congestion. 15 mL 0   Current Facility-Administered Medications on File Prior to Visit  Medication Dose Route Frequency Provider Last Rate Last Dose  . 0.9 %  sodium chloride infusion  500 mL Intravenous Once Pyrtle, Lajuan Lines, MD        Past Medical History:  Diagnosis Date  . Allergy   . Anxiety   . Arthritis   . Bipolar disorder ([place])   . Chickenpox   . Depression   . Diabetes mellitus without complication ([place])   . Fibromyalgia   . Hyperlipidemia   . Hypertension   . Multiple sclerosis ([place])   . Sinus infection     Past Surgical History:  Procedure Laterality Date  . ABDOMINAL HYSTERECTOMY  1997&1998   partial  . CHOLECYSTECTOMY  1997  . COLONOSCOPY  2006   in [place]. hyperplastic polyp x  1 per pt  . FACIAL RECONSTRUCTION SURGERY      Social History   Socioeconomic History  . Marital status: Widowed    Spouse name: Not on file  . Number of children: 2  . Years of education: 44  . Highest education level: Not on file  Occupational History  . Occupation: Retired  Scientific laboratory technician  . Financial resource strain: Not on file  . Food insecurity:    Worry: Not on file    Inability: Not on file  . Transportation needs:    Medical: Not on file    Non-medical: Not on file  Tobacco Use  . Smoking status: Current Every Day Smoker    Packs/day: 0.50    Years: 40.00    Pack years: 20.00    Types: Cigarettes  . Smokeless tobacco: Never Used  Substance and Sexual Activity  . Alcohol use:  No  . Drug use: No  . Sexual activity: Never  Lifestyle  . Physical activity:    Days per week: Not on file    Minutes per session: Not on file  . Stress: Not on file  Relationships  . Social connections:    Talks on phone: Not on file    Gets together: Not on file    Attends religious service: Not on file    Active member of club or organization: Not on file    Attends meetings of clubs or organizations: Not on file    Relationship status: Not on file  Other Topics Concern  . Not on file  Social History Narrative   Lives w/ her daughter   Right-handed   Caffeine: once per day    Family History  Problem Relation Age of Onset  . Arthritis Mother   . Heart disease Mother   . Stroke Mother   . Hypertension Mother   . Epilepsy Mother   . Emphysema Mother        smoked  . Heart disease Father   . Hypertension Father   . Diabetes Father   . Arthritis Maternal Grandmother   . Diabetes Paternal Grandmother   . Autoimmune disease Neg Hx   . Colon cancer Neg Hx        Review of Systems  Constitutional: Negative for fatigue and fever.  HENT: Negative.   Eyes: Negative.   Respiratory: Positive for cough, sputum production and shortness of breath.   Cardiovascular: Negative  for chest pain, palpitations, orthopnea, claudication and leg swelling.  Gastrointestinal: Negative.   Genitourinary: Negative.   Musculoskeletal: Negative.   Skin: Positive for rash.  Neurological: Positive for sensory change. Negative for dizziness, weakness and headaches.  Endo/Heme/Allergies: Negative for polydipsia.  Psychiatric/Behavioral: Negative for hallucinations, memory loss and suicidal ideas. The patient does not have insomnia.     Objective:   Vitals:   07/31/17 1312  BP: 106/72  Pulse: 76  Temp: 98.7 F (37.1 C)  SpO2: 99%    Body mass index is 24.51 kg/m.   Physical Examination:  Physical Exam  Constitutional: She is oriented to person, place, and time. No distress.  HENT:  Right Ear: External ear normal.  Left Ear: External ear normal.  Nose: Nose normal.  Mouth/Throat: Oropharynx is clear and moist. She has dentures. No oropharyngeal exudate.  Eyes: Pupils are equal, round, and reactive to light. Conjunctivae and EOM are normal.  Neck: Normal range of motion. Neck supple. No thyromegaly present.  Cardiovascular: Regular rhythm and intact distal pulses. Exam reveals gallop.  No murmur heard. Pulses:      Carotid pulses are 0 on the right side.      Femoral pulses are 0 on the right side. Pulmonary/Chest: Effort normal and breath sounds normal. Right breast exhibits no inverted nipple, no mass, no nipple discharge, no skin change and no tenderness. Left breast exhibits no inverted nipple, no mass, no nipple discharge, no skin change and no tenderness. No breast tenderness. Breasts are symmetrical.  Abdominal: Soft. Bowel sounds are normal.  Genitourinary:  Genitourinary Comments: declined  Musculoskeletal: Normal range of motion. She exhibits no edema.  Lymphadenopathy:    She has no cervical adenopathy.  Neurological: She is alert and oriented to person, place, and time.  Skin: Skin is warm and dry. Rash noted.     Psychiatric: She has a normal  mood and affect. Her behavior is normal. Thought content normal.  Vitals  reviewed.   ASSESSMENT and PLAN:  Solei was seen today for annual exam.  Diagnoses and all orders for this visit:  Encounter for preventative adult health care exam with abnormal findings  Type 2 diabetes mellitus with complication, without long-term current use of insulin ([place]) -     POCT glycosylated hemoglobin (Hb A1C) -     metFORMIN (GLUCOPHAGE-XR) 500 MG 24 hr tablet; Take 1 tablet (500 mg total) by mouth daily with breakfast.  Essential hypertension  Breast cancer screening by mammogram -     Cancel: MM DIGITAL SCREENING BILATERAL; Future -     MS DIGITAL SCREENING TOMO BILATERAL; Future  Episode of recurrent major depressive disorder, unspecified depression episode severity (HCC) -     mirtazapine (REMERON) 30 MG tablet; Take 1 tablet (30 mg total) by mouth at bedtime. -     citalopram (CELEXA) 40 MG tablet; Take 1 tablet (40 mg total) by mouth daily.  Gastroesophageal reflux disease, esophagitis presence not specified -     ranitidine (ZANTAC) 300 MG tablet; Take 1 tablet (300 mg total) by mouth at bedtime.  Hypertriglyceridemia -     fenofibrate (TRICOR) 145 MG tablet; Take 1 tablet (145 mg total) by mouth daily. -     ezetimibe (ZETIA) 10 MG tablet; Take 1 tablet (10 mg total) by mouth every evening.  Need for vaccination with 13-polyvalent pneumococcal conjugate vaccine -     Pneumococcal conjugate vaccine 13-valent IM  Scaly patch rash -     ketoconazole (NIZORAL) 2 % cream; Apply 1 application topically 2 (two) times daily.   No problem-specific Assessment & Plan notes found for this encounter.     Follow up: Return in about 3 months (around 10/31/2017) for DM and HTN, hyperlipidemia.  Wilfred Lacy, NP

## 2017-08-01 ENCOUNTER — Ambulatory Visit
Admission: RE | Admit: 2017-08-01 | Discharge: 2017-08-01 | Disposition: A | Discharge status: Home / Self Care | Payer: PPO | Source: Ambulatory Visit | Attending: Nurse Practitioner | Admitting: Nurse Practitioner

## 2017-08-01 DIAGNOSIS — K769 Liver disease, unspecified: Secondary | ICD-10-CM | POA: Diagnosis not present

## 2017-08-01 DIAGNOSIS — R918 Other nonspecific abnormal finding of lung field: Secondary | ICD-10-CM

## 2017-08-01 MED ORDER — GADOBENATE DIMEGLUMINE 529 MG/ML IV SOLN
15.0000 mL | Freq: Once | INTRAVENOUS | Status: AC | PRN
  Administered 2017-08-01: 15 mL via INTRAVENOUS

## 2017-08-11 ENCOUNTER — Institutional Professional Consult (permissible substitution) (INDEPENDENT_AMBULATORY_CARE_PROVIDER_SITE_OTHER): Payer: PPO | Admitting: Thoracic Surgery (Cardiothoracic Vascular Surgery)

## 2017-08-11 ENCOUNTER — Other Ambulatory Visit: Payer: Self-pay | Admitting: Nurse Practitioner

## 2017-08-11 ENCOUNTER — Other Ambulatory Visit: Payer: Self-pay

## 2017-08-11 ENCOUNTER — Encounter: Payer: Self-pay | Admitting: Thoracic Surgery (Cardiothoracic Vascular Surgery)

## 2017-08-11 VITALS — BP 115/73 | HR 77 | Resp 16 | Ht 69.0 in | Wt 167.0 lb

## 2017-08-11 DIAGNOSIS — H6993 Unspecified Eustachian tube disorder, bilateral: Secondary | ICD-10-CM

## 2017-08-11 DIAGNOSIS — R918 Other nonspecific abnormal finding of lung field: Secondary | ICD-10-CM

## 2017-08-11 DIAGNOSIS — D381 Neoplasm of uncertain behavior of trachea, bronchus and lung: Secondary | ICD-10-CM | POA: Diagnosis not present

## 2017-08-11 NOTE — Progress Notes (Signed)
PCP is [name], [name], NP Referring Provider is Tanda Rockers, MD  Chief Complaint  Patient presents with  . Lung Lesion    RUL...CT CHEST 5/23, PET 6/26, PFT 07/22/17  . Pulmonary Infiltrate    HPI: Audrey Russell is sent dictation regarding a right upper lobe nodule.  Audrey Russell is a 62 year old woman with a history of tobacco abuse (1 pack/day x 44 years), COPD, hypertension, hyperlipidemia, multiple sclerosis, arthritis, fibromyalgia, chronic pain, type 2 diabetes without complication, and bipolar disorder.  She developed a persistent cough earlier this year.  She saw Wilfred Lacy in February.  She was treated for presumed bronchitis and a chest x-ray was done, which showed a vague patchy opacity in the right lung.  Her cough persisted a repeat chest x-ray in April showed no improvement in the opacity.  A CT of the chest was done in May.  It showed significant emphysema primarily in the upper lobes bilaterally.  There was a 3.4 x 2.9 cm cavitary lesion in the right upper lobe with wall thickening and an 8 mm nodular component.  There was no adenopathy.  She was referred to Dr. Melvyn Novas.  He tested her for TB.  QuantiFERON was negative.  He also arranged pulmonary function testing which showed mild airway obstruction and more significant diffusion capacity impairment.  A PET/CT was done which showed the upper lobe nodule was hypermetabolic with a maximum SUV of 3.3.  There was no significant adenopathy.  There was a small liver lesion noted on the CT of the chest.  It did not have activity on PET.  An MRI of the abdomen was indeterminate, but felt to be benign.  A follow-up MRI was recommended at 6 months.  Audrey Russell continues to smoke.  She says she is cut down to about 5 cigarettes a day.  She started smoking at age 89 and smoked up to a pack a day.  She was diagnosed with multiple sclerosis several years ago but does not have any physical limitations related to that.  She is  able to walk up a flight of stairs without stopping but would have to catch her breath at the top.  She has noticed a loss of appetite and is lost about 7 pounds over the past 3 months.  She does not have any chest pain, pressure, or tightness.  She does have a frequent cough productive of yellow mucus.  No hemoptysis.  She has had problems with wheezing in the past but is not currently using inhaler.  Past Medical History:  Diagnosis Date  . Allergy   . Anxiety   . Arthritis   . Bipolar disorder ([place])   . Chickenpox   . Depression   . Diabetes mellitus without complication ([place])   . Fibromyalgia   . Hyperlipidemia   . Hypertension   . Multiple sclerosis ([place])   . Sinus infection     Past Surgical History:  Procedure Laterality Date  . ABDOMINAL HYSTERECTOMY  1997&1998   partial  . CHOLECYSTECTOMY  1997  . COLONOSCOPY  2006   in [place]. hyperplastic polyp x 1 per pt  . FACIAL RECONSTRUCTION SURGERY      Family History  Problem Relation Age of Onset  . Arthritis Mother   . Heart disease Mother   . Stroke Mother   . Hypertension Mother   . Epilepsy Mother   . Emphysema Mother        smoked  . Heart disease Father   .  Hypertension Father   . Diabetes Father   . Arthritis Maternal Grandmother   . Diabetes Paternal Grandmother   . Autoimmune disease Neg Hx   . Colon cancer Neg Hx     Social History Social History   Tobacco Use  . Smoking status: Current Every Day Smoker    Packs/day: 0.50    Years: 40.00    Pack years: 20.00    Types: Cigarettes  . Smokeless tobacco: Never Used  Substance Use Topics  . Alcohol use: No  . Drug use: No  44-pack-year history of smoking  Current Outpatient Medications  Medication Sig Dispense Refill  . acetaminophen (TYLENOL) 500 MG tablet Take 1,000 mg by mouth every 6 (six) hours as needed for mild pain or moderate pain.    . cetirizine (ZYRTEC) 10 MG tablet TAKE 1 TABLET BY MOUTH EVERYDAY AT BEDTIME 30 tablet 1  . citalopram  (CELEXA) 40 MG tablet Take 1 tablet (40 mg total) by mouth daily. 90 tablet 3  . ezetimibe (ZETIA) 10 MG tablet Take 1 tablet (10 mg total) by mouth every evening. 90 tablet 3  . fenofibrate (TRICOR) 145 MG tablet Take 1 tablet (145 mg total) by mouth daily. 90 tablet 3  . fluticasone (FLONASE) 50 MCG/ACT nasal spray SPRAY 2 SPRAYS INTO EACH NOSTRIL EVERY DAY 16 g 2  . gabapentin (NEURONTIN) 300 MG capsule Take 1 capsule (300 mg total) by mouth at bedtime. 30 capsule 0  . ketoconazole (NIZORAL) 2 % cream Apply 1 application topically 2 (two) times daily. 15 g 1  . lamoTRIgine (LAMICTAL) 100 MG tablet Take 1 tablet (100 mg total) by mouth daily. 30 tablet 0  . metFORMIN (GLUCOPHAGE-XR) 500 MG 24 hr tablet Take 1 tablet (500 mg total) by mouth daily with breakfast. 90 tablet 1  . mirtazapine (REMERON) 30 MG tablet Take 1 tablet (30 mg total) by mouth at bedtime. 30 tablet 6  . ranitidine (ZANTAC) 300 MG tablet Take 1 tablet (300 mg total) by mouth at bedtime. 90 tablet 3  . saccharomyces boulardii (FLORASTOR) 250 MG capsule Take 1 capsule (250 mg total) by mouth 2 (two) times daily.    . sodium chloride (OCEAN) 0.65 % SOLN nasal spray Place 1 spray into both nostrils as needed for congestion. 15 mL 0   Current Facility-Administered Medications  Medication Dose Route Frequency Provider Last Rate Last Dose  . 0.9 %  sodium chloride infusion  500 mL Intravenous Once Pyrtle, Lajuan Lines, MD        No Known Allergies  Review of Systems  Constitutional: Negative for activity change, appetite change, chills, fever and unexpected weight change (Lost 7 pounds in 3 months).  HENT: Positive for dental problem (Dentures). Negative for trouble swallowing and voice change.   Eyes: Negative for visual disturbance.  Respiratory: Positive for cough and shortness of breath. Negative for wheezing.   Cardiovascular: Negative for chest pain, palpitations and leg swelling.  Gastrointestinal: Negative for abdominal  distention and abdominal pain.  Endocrine: Negative for polydipsia and polyphagia.  Genitourinary: Negative for difficulty urinating and dysuria.  Musculoskeletal: Positive for arthralgias, back pain and joint swelling.  Neurological: Negative for dizziness, seizures and weakness.  Hematological: Negative for adenopathy. Does not bruise/bleed easily.  Psychiatric/Behavioral: Positive for dysphoric mood. The patient is nervous/anxious.   All other systems reviewed and are negative.   BP 115/73 (BP Location: Left Arm, Patient Position: Sitting, Cuff Size: Normal)   Pulse 77   Resp 16  Ht 5\' 9"  (1.753 m)   Wt 167 lb (75.8 kg)   SpO2 98% Comment: ON RA  BMI 24.66 kg/m  Physical Exam  Constitutional: She is oriented to person, place, and time. She appears well-developed and well-nourished.  HENT:  Head: Normocephalic and atraumatic.  Mouth/Throat: No oropharyngeal exudate.  Eyes: Pupils are equal, round, and reactive to light. Conjunctivae and EOM are normal. No scleral icterus.  Neck: Neck supple. No thyromegaly present.  Cardiovascular: Normal rate, regular rhythm and normal heart sounds. Exam reveals no gallop and no friction rub.  No murmur heard. Pulmonary/Chest: Effort normal and breath sounds normal. No respiratory distress. She has no wheezes. She has no rales.  Abdominal: Soft. She exhibits no distension. There is no tenderness.  Musculoskeletal: She exhibits no edema.  Lymphadenopathy:    She has no cervical adenopathy.  Neurological: She is alert and oriented to person, place, and time. No cranial nerve deficit. She exhibits normal muscle tone. Coordination normal.  Skin: Skin is warm and dry.  Artificial nails, unable to assess clubbing  Vitals reviewed.   Diagnostic Tests: CT CHEST WITH CONTRAST  TECHNIQUE: Multidetector CT imaging of the chest was performed during intravenous contrast administration.  CONTRAST:  22mL ISOVUE-300 IOPAMIDOL (ISOVUE-300) INJECTION  61%  COMPARISON:  05/06/2017 chest radiograph.  FINDINGS: Cardiovascular: Normal heart size. No significant pericardial effusion/thickening. Right coronary atherosclerosis. Atherosclerotic nonaneurysmal thoracic aorta. Normal caliber pulmonary arteries. No central pulmonary emboli.  Mediastinum/Nodes: No discrete thyroid nodules. Unremarkable esophagus. No axillary adenopathy. Mildly enlarged 1.2 cm posterior mediastinal node between the esophagus and descending thoracic aorta (series 2/image 65). No additional pathologically enlarged mediastinal nodes. No hilar adenopathy.  Lungs/Pleura: No pneumothorax. No pleural effusion. Severe centrilobular emphysema with diffuse bronchial wall thickening. There is an irregular 3.4 x 2.9 cm cavitary lesion in the peripheral right upper lobe (series 3/image 61) with irregular wall thickening including an 8 mm solid mural nodular component. No acute consolidative airspace disease or additional significant pulmonary masses or nodules.  Upper abdomen: There is a vague 1.6 cm hypervascular focus in segment 8 right liver lobe (series 2/image 134). Cholecystectomy.  Musculoskeletal: No aggressive appearing focal osseous lesions. Mild thoracic spondylosis.  IMPRESSION: 1. Indeterminate irregular 3.4 cm cavitary lesion in the peripheral right upper lobe with 8 mm solid mural nodular component. Primary bronchogenic malignancy cannot be excluded. Solitary mildly enlarged posterior mediastinal node between the esophagus and descending thoracic aorta, nonspecific. PET-CT is suggested for further characterization of these findings. 2. Vague 1.6 cm hypervascular focus in the segment 8 right liver lobe, indeterminate. This focus would be best characterized at MRI abdomen without and with IV contrast. 3. One vessel coronary atherosclerosis.  Aortic Atherosclerosis (ICD10-I70.0) and Emphysema (ICD10-J43.9).  These results will be called to  the ordering clinician or representative by the Radiologist Assistant, and communication documented in the PACS or zVision Dashboard.   Electronically Signed   By: Ilona Sorrel M.D.   On: 06/12/2017 15:04 NUCLEAR MEDICINE PET SKULL BASE TO THIGH  TECHNIQUE: 7.1 mCi F-18 FDG was injected intravenously. Full-ring PET imaging was performed from the skull base to thigh after the radiotracer. CT data was obtained and used for attenuation correction and anatomic localization.  Fasting blood glucose: 230 mg/dl  COMPARISON:  CT chest dated 06/12/2017  FINDINGS: Mediastinal blood pool activity: SUV max 2.1  NECK: Mild asymmetry of activity in the palatine tonsils, maximum SUV 5.2 on the left and 3.6 on the right, without definite CT correlate. No hypermetabolic adenopathy.  Mild asymmetry of glottic activity, maximum SUV 6.9 on the left and 5.5 on the right, without CT correlate, likely physiologic.  Incidental CT findings: Chronic facial deformities related to prior fractures and repairs.  CHEST: Persistent cavitary lesion in the right upper lobe with interstitial thickening along its margins and a slightly nodular component along the inferolateral margin measuring up to 7 mm in thickness, maximum SUV along the thickened peripheral portion 3.3.  The retroesophageal lymph node at the level of the carina is not appreciably hypermetabolic, maximum SUV 2.5.  Incidental CT findings: Emphysema. Atherosclerotic calcification of the thoracic aorta and right coronary artery.  ABDOMEN/PELVIS: There is no abnormal activity in the liver in the vicinity of the hypervascular 1.6 cm lesion shown in segment 8 of the liver on the prior chest CT. Activity in this vicinity matches that of the surrounding hepatic parenchyma.  Prominent radiopharmaceutical in the right renal collecting system associated with the extrarenal pelvis.  Incidental CT findings: Aortoiliac atherosclerotic  vascular disease. Cholecystectomy.  SKELETON: Chronic posttraumatic craniofacial deformities as noted above.  Incidental CT findings: Absent segment of the right fifth rib with some irregularity of the right anterior sixth rib noted.  IMPRESSION: 1. Low-grade but abnormal activity associated with the solid portion of the cavitary lesion in the right upper lobe, maximum SUV 3.3, suspicious for malignancy. 2. The retroesophageal lymph node noted on the prior exam is not appreciably hypermetabolic, maximum SUV very subtly above blood pool levels. 3. There is some asymmetric activity along the palatine tonsils and glottic region without correlative findings on the CT data, probably physiologic. 4. No abnormal activity in the liver in the vicinity of the hypervascular 1.6 cm lesion shown on recent chest CT. This would tend to favor a benign etiology for the lesion, such as flash filling hemangioma or focal nodular hyperplasia, although is not considered entirely specific. If the patient has abnormal liver enzymes, a history of gastrointestinal malignancy, or if otherwise clinically warranted, hepatic protocol MRI with and without contrast could be utilized for further characterization. 5. Other imaging findings of potential clinical significance: Aortic Atherosclerosis (ICD10-I70.0) and Emphysema (ICD10-J43.9). Posttraumatic findings along the craniofacial bones and right fifth and sixth ribs. Coronary atherosclerosis. Fullness of the right renal collecting system favoring extrarenal pelvis, less likely low-grade partial right UPJ obstruction.   Electronically Signed   By: Van Clines M.D.   On: 07/17/2017 10:41  Pulmonary function testing FVC 2.84 (80%) FEV1 2.00 (79%) FEV1 2.10 (83%) postbronchodilator DLCO 11.72 (37%) uncorrected DL/VA 2.71 (50% of predicted)  I personally reviewed the CT and PET/CT images and concur with the findings noted  above  Impression: Ms. Russell is a 62 year old woman with a history of tobacco abuse which is ongoing, COPD, bipolar disorder, hypertension, hyperlipidemia, fibromyalgia, chronic pain, arthritis, and uncomplicated type 2 diabetes.  She has had a persistent cough for the past 7 months.  Work-up included chest x-ray which showed an opacity in the right upper lobe.  Follow-up chest x-ray was unchanged.  On CT scan she has a 3.4 x 2.9 cm cystic mass in the periphery of the right upper lobe with an irregular wall.  This was hypermetabolic on PET/CT.  Differential diagnosis includes primary bronchogenic carcinoma, mycobacterial or fungal infections, and BOOP.  Her TB work-up was negative.  This has to be considered a primary bronchogenic carcinoma unless we can prove otherwise.  I reviewed the CT and PET/CT images with Audrey Russell and her daughter.  We discussed the differential diagnosis.  Unfortunately this is not amenable to CT-guided biopsy or bronchoscopic biopsy due to the high risk of pneumothorax.  In my opinion the best approach is right VATS for wedge resection for definitive diagnosis and then a thoracoscopic right upper lobectomy if the nodule is cancerous on frozen section.  I would not recommend continued radiographic follow-up in the setting of a suspicious lesion in an active smoker with a positive PET/CT.  I described the proposed operation to Audrey Russell and her daughter.  I informed him of the general nature of the procedure including the need for general anesthesia, the incisions to be used, the use of a drainage tube postoperatively, the expected hospital stay, and the overall recovery.  I informed him of the indications, risk, benefits, and alternatives.  They understand the risks include, but are not limited to death, MI, DVT, PE, stroke, bleeding, possible need for transfusion, infection, prolonged air leak, cardiac arrhythmias, respiratory failure, renal failure, as well as  possibility of other unforeseeable complications.  She does have limited pulmonary reserve, but corrected diffusion capacity is 50% of predicted and her flows are more than adequate to tolerate a lobectomy.  I think she can tolerate and a lobectomy without dramatically compromising her pulmonary status particularly in the right upper lobe which is heavily involved with emphysema and contributing little to her functional capacity.  She accepts the risk of surgery and agrees to proceed.  Coronary artery disease-calcification noted on CT portion of PET/CT.  Asymptomatic.  COPD-see above.  Abnormal finding and liver on CT-MRI suggest probably benign, but needs follow-up scan in 6 months  Tobacco abuse- Smoking cessation instruction/counseling given:  counseled patient on the dangers of tobacco use, advised patient to stop smoking, and reviewed strategies to maximize success  Plan: Right VATS for wedge resection and possible right upper lobectomy on Friday, 08/15/2017  Melrose Nakayama, MD [facility] [phone]

## 2017-08-11 NOTE — H&P (View-Only) (Signed)
PCP is [name], [name], NP Referring Provider is Tanda Rockers, MD  Chief Complaint  Patient presents with  . Lung Lesion    RUL...CT CHEST 5/23, PET 6/26, PFT 07/22/17  . Pulmonary Infiltrate    HPI: Audrey Russell is sent dictation regarding a right upper lobe nodule.  Audrey Russell is a 62 year old woman with a history of tobacco abuse (1 pack/day x 44 years), COPD, hypertension, hyperlipidemia, multiple sclerosis, arthritis, fibromyalgia, chronic pain, type 2 diabetes without complication, and bipolar disorder.  She developed a persistent cough earlier this year.  She saw Wilfred Lacy in February.  She was treated for presumed bronchitis and a chest x-ray was done, which showed a vague patchy opacity in the right lung.  Her cough persisted a repeat chest x-ray in April showed no improvement in the opacity.  A CT of the chest was done in May.  It showed significant emphysema primarily in the upper lobes bilaterally.  There was a 3.4 x 2.9 cm cavitary lesion in the right upper lobe with wall thickening and an 8 mm nodular component.  There was no adenopathy.  She was referred to Dr. Melvyn Novas.  He tested her for TB.  QuantiFERON was negative.  He also arranged pulmonary function testing which showed mild airway obstruction and more significant diffusion capacity impairment.  A PET/CT was done which showed the upper lobe nodule was hypermetabolic with a maximum SUV of 3.3.  There was no significant adenopathy.  There was a small liver lesion noted on the CT of the chest.  It did not have activity on PET.  An MRI of the abdomen was indeterminate, but felt to be benign.  A follow-up MRI was recommended at 6 months.  Audrey Russell continues to smoke.  She says she is cut down to about 5 cigarettes a day.  She started smoking at age 32 and smoked up to a pack a day.  She was diagnosed with multiple sclerosis several years ago but does not have any physical limitations related to that.  She is  able to walk up a flight of stairs without stopping but would have to catch her breath at the top.  She has noticed a loss of appetite and is lost about 7 pounds over the past 3 months.  She does not have any chest pain, pressure, or tightness.  She does have a frequent cough productive of yellow mucus.  No hemoptysis.  She has had problems with wheezing in the past but is not currently using inhaler.  Past Medical History:  Diagnosis Date  . Allergy   . Anxiety   . Arthritis   . Bipolar disorder ([place])   . Chickenpox   . Depression   . Diabetes mellitus without complication ([place])   . Fibromyalgia   . Hyperlipidemia   . Hypertension   . Multiple sclerosis ([place])   . Sinus infection     Past Surgical History:  Procedure Laterality Date  . ABDOMINAL HYSTERECTOMY  1997&1998   partial  . CHOLECYSTECTOMY  1997  . COLONOSCOPY  2006   in [place]. hyperplastic polyp x 1 per pt  . FACIAL RECONSTRUCTION SURGERY      Family History  Problem Relation Age of Onset  . Arthritis Mother   . Heart disease Mother   . Stroke Mother   . Hypertension Mother   . Epilepsy Mother   . Emphysema Mother        smoked  . Heart disease Father   .  Hypertension Father   . Diabetes Father   . Arthritis Maternal Grandmother   . Diabetes Paternal Grandmother   . Autoimmune disease Neg Hx   . Colon cancer Neg Hx     Social History Social History   Tobacco Use  . Smoking status: Current Every Day Smoker    Packs/day: 0.50    Years: 40.00    Pack years: 20.00    Types: Cigarettes  . Smokeless tobacco: Never Used  Substance Use Topics  . Alcohol use: No  . Drug use: No  44-pack-year history of smoking  Current Outpatient Medications  Medication Sig Dispense Refill  . acetaminophen (TYLENOL) 500 MG tablet Take 1,000 mg by mouth every 6 (six) hours as needed for mild pain or moderate pain.    . cetirizine (ZYRTEC) 10 MG tablet TAKE 1 TABLET BY MOUTH EVERYDAY AT BEDTIME 30 tablet 1  . citalopram  (CELEXA) 40 MG tablet Take 1 tablet (40 mg total) by mouth daily. 90 tablet 3  . ezetimibe (ZETIA) 10 MG tablet Take 1 tablet (10 mg total) by mouth every evening. 90 tablet 3  . fenofibrate (TRICOR) 145 MG tablet Take 1 tablet (145 mg total) by mouth daily. 90 tablet 3  . fluticasone (FLONASE) 50 MCG/ACT nasal spray SPRAY 2 SPRAYS INTO EACH NOSTRIL EVERY DAY 16 g 2  . gabapentin (NEURONTIN) 300 MG capsule Take 1 capsule (300 mg total) by mouth at bedtime. 30 capsule 0  . ketoconazole (NIZORAL) 2 % cream Apply 1 application topically 2 (two) times daily. 15 g 1  . lamoTRIgine (LAMICTAL) 100 MG tablet Take 1 tablet (100 mg total) by mouth daily. 30 tablet 0  . metFORMIN (GLUCOPHAGE-XR) 500 MG 24 hr tablet Take 1 tablet (500 mg total) by mouth daily with breakfast. 90 tablet 1  . mirtazapine (REMERON) 30 MG tablet Take 1 tablet (30 mg total) by mouth at bedtime. 30 tablet 6  . ranitidine (ZANTAC) 300 MG tablet Take 1 tablet (300 mg total) by mouth at bedtime. 90 tablet 3  . saccharomyces boulardii (FLORASTOR) 250 MG capsule Take 1 capsule (250 mg total) by mouth 2 (two) times daily.    . sodium chloride (OCEAN) 0.65 % SOLN nasal spray Place 1 spray into both nostrils as needed for congestion. 15 mL 0   Current Facility-Administered Medications  Medication Dose Route Frequency Provider Last Rate Last Dose  . 0.9 %  sodium chloride infusion  500 mL Intravenous Once Pyrtle, Lajuan Lines, MD        No Known Allergies  Review of Systems  Constitutional: Negative for activity change, appetite change, chills, fever and unexpected weight change (Lost 7 pounds in 3 months).  HENT: Positive for dental problem (Dentures). Negative for trouble swallowing and voice change.   Eyes: Negative for visual disturbance.  Respiratory: Positive for cough and shortness of breath. Negative for wheezing.   Cardiovascular: Negative for chest pain, palpitations and leg swelling.  Gastrointestinal: Negative for abdominal  distention and abdominal pain.  Endocrine: Negative for polydipsia and polyphagia.  Genitourinary: Negative for difficulty urinating and dysuria.  Musculoskeletal: Positive for arthralgias, back pain and joint swelling.  Neurological: Negative for dizziness, seizures and weakness.  Hematological: Negative for adenopathy. Does not bruise/bleed easily.  Psychiatric/Behavioral: Positive for dysphoric mood. The patient is nervous/anxious.   All other systems reviewed and are negative.   BP 115/73 (BP Location: Left Arm, Patient Position: Sitting, Cuff Size: Normal)   Pulse 77   Resp 16  Ht 5\' 9"  (1.753 m)   Wt 167 lb (75.8 kg)   SpO2 98% Comment: ON RA  BMI 24.66 kg/m  Physical Exam  Constitutional: She is oriented to person, place, and time. She appears well-developed and well-nourished.  HENT:  Head: Normocephalic and atraumatic.  Mouth/Throat: No oropharyngeal exudate.  Eyes: Pupils are equal, round, and reactive to light. Conjunctivae and EOM are normal. No scleral icterus.  Neck: Neck supple. No thyromegaly present.  Cardiovascular: Normal rate, regular rhythm and normal heart sounds. Exam reveals no gallop and no friction rub.  No murmur heard. Pulmonary/Chest: Effort normal and breath sounds normal. No respiratory distress. She has no wheezes. She has no rales.  Abdominal: Soft. She exhibits no distension. There is no tenderness.  Musculoskeletal: She exhibits no edema.  Lymphadenopathy:    She has no cervical adenopathy.  Neurological: She is alert and oriented to person, place, and time. No cranial nerve deficit. She exhibits normal muscle tone. Coordination normal.  Skin: Skin is warm and dry.  Artificial nails, unable to assess clubbing  Vitals reviewed.   Diagnostic Tests: CT CHEST WITH CONTRAST  TECHNIQUE: Multidetector CT imaging of the chest was performed during intravenous contrast administration.  CONTRAST:  68mL ISOVUE-300 IOPAMIDOL (ISOVUE-300) INJECTION  61%  COMPARISON:  05/06/2017 chest radiograph.  FINDINGS: Cardiovascular: Normal heart size. No significant pericardial effusion/thickening. Right coronary atherosclerosis. Atherosclerotic nonaneurysmal thoracic aorta. Normal caliber pulmonary arteries. No central pulmonary emboli.  Mediastinum/Nodes: No discrete thyroid nodules. Unremarkable esophagus. No axillary adenopathy. Mildly enlarged 1.2 cm posterior mediastinal node between the esophagus and descending thoracic aorta (series 2/image 65). No additional pathologically enlarged mediastinal nodes. No hilar adenopathy.  Lungs/Pleura: No pneumothorax. No pleural effusion. Severe centrilobular emphysema with diffuse bronchial wall thickening. There is an irregular 3.4 x 2.9 cm cavitary lesion in the peripheral right upper lobe (series 3/image 61) with irregular wall thickening including an 8 mm solid mural nodular component. No acute consolidative airspace disease or additional significant pulmonary masses or nodules.  Upper abdomen: There is a vague 1.6 cm hypervascular focus in segment 8 right liver lobe (series 2/image 134). Cholecystectomy.  Musculoskeletal: No aggressive appearing focal osseous lesions. Mild thoracic spondylosis.  IMPRESSION: 1. Indeterminate irregular 3.4 cm cavitary lesion in the peripheral right upper lobe with 8 mm solid mural nodular component. Primary bronchogenic malignancy cannot be excluded. Solitary mildly enlarged posterior mediastinal node between the esophagus and descending thoracic aorta, nonspecific. PET-CT is suggested for further characterization of these findings. 2. Vague 1.6 cm hypervascular focus in the segment 8 right liver lobe, indeterminate. This focus would be best characterized at MRI abdomen without and with IV contrast. 3. One vessel coronary atherosclerosis.  Aortic Atherosclerosis (ICD10-I70.0) and Emphysema (ICD10-J43.9).  These results will be called to  the ordering clinician or representative by the Radiologist Assistant, and communication documented in the PACS or zVision Dashboard.   Electronically Signed   By: Ilona Sorrel M.D.   On: 06/12/2017 15:04 NUCLEAR MEDICINE PET SKULL BASE TO THIGH  TECHNIQUE: 7.1 mCi F-18 FDG was injected intravenously. Full-ring PET imaging was performed from the skull base to thigh after the radiotracer. CT data was obtained and used for attenuation correction and anatomic localization.  Fasting blood glucose: 230 mg/dl  COMPARISON:  CT chest dated 06/12/2017  FINDINGS: Mediastinal blood pool activity: SUV max 2.1  NECK: Mild asymmetry of activity in the palatine tonsils, maximum SUV 5.2 on the left and 3.6 on the right, without definite CT correlate. No hypermetabolic adenopathy.  Mild asymmetry of glottic activity, maximum SUV 6.9 on the left and 5.5 on the right, without CT correlate, likely physiologic.  Incidental CT findings: Chronic facial deformities related to prior fractures and repairs.  CHEST: Persistent cavitary lesion in the right upper lobe with interstitial thickening along its margins and a slightly nodular component along the inferolateral margin measuring up to 7 mm in thickness, maximum SUV along the thickened peripheral portion 3.3.  The retroesophageal lymph node at the level of the carina is not appreciably hypermetabolic, maximum SUV 2.5.  Incidental CT findings: Emphysema. Atherosclerotic calcification of the thoracic aorta and right coronary artery.  ABDOMEN/PELVIS: There is no abnormal activity in the liver in the vicinity of the hypervascular 1.6 cm lesion shown in segment 8 of the liver on the prior chest CT. Activity in this vicinity matches that of the surrounding hepatic parenchyma.  Prominent radiopharmaceutical in the right renal collecting system associated with the extrarenal pelvis.  Incidental CT findings: Aortoiliac atherosclerotic  vascular disease. Cholecystectomy.  SKELETON: Chronic posttraumatic craniofacial deformities as noted above.  Incidental CT findings: Absent segment of the right fifth rib with some irregularity of the right anterior sixth rib noted.  IMPRESSION: 1. Low-grade but abnormal activity associated with the solid portion of the cavitary lesion in the right upper lobe, maximum SUV 3.3, suspicious for malignancy. 2. The retroesophageal lymph node noted on the prior exam is not appreciably hypermetabolic, maximum SUV very subtly above blood pool levels. 3. There is some asymmetric activity along the palatine tonsils and glottic region without correlative findings on the CT data, probably physiologic. 4. No abnormal activity in the liver in the vicinity of the hypervascular 1.6 cm lesion shown on recent chest CT. This would tend to favor a benign etiology for the lesion, such as flash filling hemangioma or focal nodular hyperplasia, although is not considered entirely specific. If the patient has abnormal liver enzymes, a history of gastrointestinal malignancy, or if otherwise clinically warranted, hepatic protocol MRI with and without contrast could be utilized for further characterization. 5. Other imaging findings of potential clinical significance: Aortic Atherosclerosis (ICD10-I70.0) and Emphysema (ICD10-J43.9). Posttraumatic findings along the craniofacial bones and right fifth and sixth ribs. Coronary atherosclerosis. Fullness of the right renal collecting system favoring extrarenal pelvis, less likely low-grade partial right UPJ obstruction.   Electronically Signed   By: Van Clines M.D.   On: 07/17/2017 10:41  Pulmonary function testing FVC 2.84 (80%) FEV1 2.00 (79%) FEV1 2.10 (83%) postbronchodilator DLCO 11.72 (37%) uncorrected DL/VA 2.71 (50% of predicted)  I personally reviewed the CT and PET/CT images and concur with the findings noted  above  Impression: Ms. Russell is a 62 year old woman with a history of tobacco abuse which is ongoing, COPD, bipolar disorder, hypertension, hyperlipidemia, fibromyalgia, chronic pain, arthritis, and uncomplicated type 2 diabetes.  She has had a persistent cough for the past 7 months.  Work-up included chest x-ray which showed an opacity in the right upper lobe.  Follow-up chest x-ray was unchanged.  On CT scan she has a 3.4 x 2.9 cm cystic mass in the periphery of the right upper lobe with an irregular wall.  This was hypermetabolic on PET/CT.  Differential diagnosis includes primary bronchogenic carcinoma, mycobacterial or fungal infections, and BOOP.  Her TB work-up was negative.  This has to be considered a primary bronchogenic carcinoma unless we can prove otherwise.  I reviewed the CT and PET/CT images with Audrey Russell and her daughter.  We discussed the differential diagnosis.  Unfortunately this is not amenable to CT-guided biopsy or bronchoscopic biopsy due to the high risk of pneumothorax.  In my opinion the best approach is right VATS for wedge resection for definitive diagnosis and then a thoracoscopic right upper lobectomy if the nodule is cancerous on frozen section.  I would not recommend continued radiographic follow-up in the setting of a suspicious lesion in an active smoker with a positive PET/CT.  I described the proposed operation to Ms. Russell and her daughter.  I informed him of the general nature of the procedure including the need for general anesthesia, the incisions to be used, the use of a drainage tube postoperatively, the expected hospital stay, and the overall recovery.  I informed him of the indications, risk, benefits, and alternatives.  They understand the risks include, but are not limited to death, MI, DVT, PE, stroke, bleeding, possible need for transfusion, infection, prolonged air leak, cardiac arrhythmias, respiratory failure, renal failure, as well as  possibility of other unforeseeable complications.  She does have limited pulmonary reserve, but corrected diffusion capacity is 50% of predicted and her flows are more than adequate to tolerate a lobectomy.  I think she can tolerate and a lobectomy without dramatically compromising her pulmonary status particularly in the right upper lobe which is heavily involved with emphysema and contributing little to her functional capacity.  She accepts the risk of surgery and agrees to proceed.  Coronary artery disease-calcification noted on CT portion of PET/CT.  Asymptomatic.  COPD-see above.  Abnormal finding and liver on CT-MRI suggest probably benign, but needs follow-up scan in 6 months  Tobacco abuse- Smoking cessation instruction/counseling given:  counseled patient on the dangers of tobacco use, advised patient to stop smoking, and reviewed strategies to maximize success  Plan: Right VATS for wedge resection and possible right upper lobectomy on Friday, 08/15/2017  Melrose Nakayama, MD [facility] [phone]

## 2017-08-12 ENCOUNTER — Other Ambulatory Visit: Payer: Self-pay

## 2017-08-12 DIAGNOSIS — R911 Solitary pulmonary nodule: Secondary | ICD-10-CM

## 2017-08-13 ENCOUNTER — Encounter (HOSPITAL_COMMUNITY)
Admission: RE | Admit: 2017-08-13 | Discharge: 2017-08-13 | Disposition: A | Discharge status: Home / Self Care | Payer: PPO | Source: Ambulatory Visit | Attending: Thoracic Surgery (Cardiothoracic Vascular Surgery) | Admitting: Thoracic Surgery (Cardiothoracic Vascular Surgery)

## 2017-08-13 ENCOUNTER — Ambulatory Visit (HOSPITAL_COMMUNITY)
Admission: RE | Admit: 2017-08-13 | Discharge: 2017-08-13 | Disposition: A | Discharge status: Home / Self Care | Payer: PPO | Source: Ambulatory Visit | Attending: Thoracic Surgery (Cardiothoracic Vascular Surgery) | Admitting: Thoracic Surgery (Cardiothoracic Vascular Surgery)

## 2017-08-13 ENCOUNTER — Other Ambulatory Visit: Payer: Self-pay

## 2017-08-13 ENCOUNTER — Encounter (HOSPITAL_COMMUNITY): Payer: Self-pay

## 2017-08-13 DIAGNOSIS — E785 Hyperlipidemia, unspecified: Secondary | ICD-10-CM

## 2017-08-13 DIAGNOSIS — M797 Fibromyalgia: Secondary | ICD-10-CM | POA: Diagnosis present

## 2017-08-13 DIAGNOSIS — Z8249 Family history of ischemic heart disease and other diseases of the circulatory system: Secondary | ICD-10-CM | POA: Diagnosis not present

## 2017-08-13 DIAGNOSIS — R918 Other nonspecific abnormal finding of lung field: Secondary | ICD-10-CM | POA: Insufficient documentation

## 2017-08-13 DIAGNOSIS — Z01818 Encounter for other preprocedural examination: Secondary | ICD-10-CM | POA: Diagnosis not present

## 2017-08-13 DIAGNOSIS — C3411 Malignant neoplasm of upper lobe, right bronchus or lung: Secondary | ICD-10-CM | POA: Diagnosis present

## 2017-08-13 DIAGNOSIS — I10 Essential (primary) hypertension: Secondary | ICD-10-CM | POA: Diagnosis present

## 2017-08-13 DIAGNOSIS — I700 Atherosclerosis of aorta: Secondary | ICD-10-CM | POA: Insufficient documentation

## 2017-08-13 DIAGNOSIS — E1151 Type 2 diabetes mellitus with diabetic peripheral angiopathy without gangrene: Secondary | ICD-10-CM | POA: Diagnosis present

## 2017-08-13 DIAGNOSIS — R911 Solitary pulmonary nodule: Secondary | ICD-10-CM | POA: Insufficient documentation

## 2017-08-13 DIAGNOSIS — Z01810 Encounter for preprocedural cardiovascular examination: Secondary | ICD-10-CM | POA: Insufficient documentation

## 2017-08-13 DIAGNOSIS — E119 Type 2 diabetes mellitus without complications: Secondary | ICD-10-CM

## 2017-08-13 DIAGNOSIS — J449 Chronic obstructive pulmonary disease, unspecified: Secondary | ICD-10-CM | POA: Diagnosis present

## 2017-08-13 DIAGNOSIS — F319 Bipolar disorder, unspecified: Secondary | ICD-10-CM | POA: Diagnosis present

## 2017-08-13 DIAGNOSIS — Z794 Long term (current) use of insulin: Secondary | ICD-10-CM

## 2017-08-13 DIAGNOSIS — Z79899 Other long term (current) drug therapy: Secondary | ICD-10-CM

## 2017-08-13 DIAGNOSIS — K219 Gastro-esophageal reflux disease without esophagitis: Secondary | ICD-10-CM

## 2017-08-13 DIAGNOSIS — G35 Multiple sclerosis: Secondary | ICD-10-CM | POA: Diagnosis present

## 2017-08-13 DIAGNOSIS — Z9049 Acquired absence of other specified parts of digestive tract: Secondary | ICD-10-CM | POA: Diagnosis not present

## 2017-08-13 DIAGNOSIS — I2510 Atherosclerotic heart disease of native coronary artery without angina pectoris: Secondary | ICD-10-CM | POA: Diagnosis present

## 2017-08-13 DIAGNOSIS — Z833 Family history of diabetes mellitus: Secondary | ICD-10-CM | POA: Diagnosis not present

## 2017-08-13 DIAGNOSIS — Z7984 Long term (current) use of oral hypoglycemic drugs: Secondary | ICD-10-CM | POA: Diagnosis not present

## 2017-08-13 DIAGNOSIS — J439 Emphysema, unspecified: Secondary | ICD-10-CM | POA: Diagnosis not present

## 2017-08-13 DIAGNOSIS — Z90710 Acquired absence of both cervix and uterus: Secondary | ICD-10-CM | POA: Diagnosis not present

## 2017-08-13 DIAGNOSIS — J939 Pneumothorax, unspecified: Secondary | ICD-10-CM | POA: Diagnosis not present

## 2017-08-13 DIAGNOSIS — G894 Chronic pain syndrome: Secondary | ICD-10-CM | POA: Diagnosis not present

## 2017-08-13 DIAGNOSIS — I493 Ventricular premature depolarization: Secondary | ICD-10-CM | POA: Diagnosis not present

## 2017-08-13 DIAGNOSIS — Z4682 Encounter for fitting and adjustment of non-vascular catheter: Secondary | ICD-10-CM | POA: Diagnosis not present

## 2017-08-13 DIAGNOSIS — J9382 Other air leak: Secondary | ICD-10-CM | POA: Diagnosis not present

## 2017-08-13 DIAGNOSIS — Z87891 Personal history of nicotine dependence: Secondary | ICD-10-CM | POA: Insufficient documentation

## 2017-08-13 DIAGNOSIS — F17210 Nicotine dependence, cigarettes, uncomplicated: Secondary | ICD-10-CM | POA: Diagnosis present

## 2017-08-13 DIAGNOSIS — D62 Acute posthemorrhagic anemia: Secondary | ICD-10-CM | POA: Diagnosis not present

## 2017-08-13 HISTORY — DX: Gastro-esophageal reflux disease without esophagitis: K21.9

## 2017-08-13 HISTORY — DX: Cardiac murmur, unspecified: R01.1

## 2017-08-13 HISTORY — DX: Chronic obstructive pulmonary disease, unspecified: J44.9

## 2017-08-13 LAB — CBC
HCT: 46.9 % — ABNORMAL HIGH (ref 36.0–46.0)
Hemoglobin: 14.7 g/dL (ref 12.0–15.0)
MCH: 27.9 pg (ref 26.0–34.0)
MCHC: 31.3 g/dL (ref 30.0–36.0)
MCV: 89.2 fL (ref 78.0–100.0)
PLATELETS: 227 10*3/uL (ref 150–400)
RBC: 5.26 MIL/uL — AB (ref 3.87–5.11)
RDW: 14.2 % (ref 11.5–15.5)
WBC: 8.1 10*3/uL (ref 4.0–10.5)

## 2017-08-13 LAB — URINALYSIS, ROUTINE W REFLEX MICROSCOPIC
Bilirubin Urine: NEGATIVE
Glucose, UA: NEGATIVE mg/dL
Hgb urine dipstick: NEGATIVE
Ketones, ur: NEGATIVE mg/dL
Leukocytes, UA: NEGATIVE
Nitrite: NEGATIVE
PH: 5 (ref 5.0–8.0)
Protein, ur: NEGATIVE mg/dL
Specific Gravity, Urine: 1.019 (ref 1.005–1.030)

## 2017-08-13 LAB — COMPREHENSIVE METABOLIC PANEL
ALT: 18 U/L (ref 0–44)
AST: 24 U/L (ref 15–41)
Albumin: 4.1 g/dL (ref 3.5–5.0)
Alkaline Phosphatase: 81 U/L (ref 38–126)
Anion gap: 11 (ref 5–15)
BUN: 13 mg/dL (ref 8–23)
CHLORIDE: 114 mmol/L — AB (ref 98–111)
CO2: 19 mmol/L — AB (ref 22–32)
CREATININE: 0.77 mg/dL (ref 0.44–1.00)
Calcium: 9.9 mg/dL (ref 8.9–10.3)
GFR calc non Af Amer: >60 mL/min (ref >60)
Glucose, Bld: 89 mg/dL (ref 70–99)
POTASSIUM: 4.6 mmol/L (ref 3.5–5.1)
SODIUM: 144 mmol/L (ref 135–145)
Total Bilirubin: 0.6 mg/dL (ref 0.3–1.2)
Total Protein: 7.5 g/dL (ref 6.5–8.1)

## 2017-08-13 LAB — SURGICAL PCR SCREEN
MRSA, PCR: NEGATIVE
STAPHYLOCOCCUS AUREUS: NEGATIVE

## 2017-08-13 LAB — BLOOD GAS, ARTERIAL
Acid-base deficit: 0.6 mmol/L (ref 0.0–2.0)
BICARBONATE: 23.3 mmol/L (ref 20.0–28.0)
Drawn by: 470591
FIO2: 21
O2 Saturation: 98.4 %
PATIENT TEMPERATURE: 98.6
PO2 ART: 115 mmHg — AB (ref 83.0–108.0)
pCO2 arterial: 37.1 mmHg (ref 32.0–48.0)
pH, Arterial: 7.415 (ref 7.350–7.450)

## 2017-08-13 LAB — TYPE AND SCREEN
ABO/RH(D): A POS
ANTIBODY SCREEN: NEGATIVE

## 2017-08-13 LAB — APTT: APTT: 31 s (ref 24–36)

## 2017-08-13 LAB — HEMOGLOBIN A1C
Hgb A1c MFr Bld: 7.4 % — ABNORMAL HIGH (ref 4.8–5.6)
Mean Plasma Glucose: 165.68 mg/dL

## 2017-08-13 LAB — PROTIME-INR
INR: 0.96
Prothrombin Time: 12.7 seconds (ref 11.4–15.2)

## 2017-08-13 LAB — ABO/RH: ABO/RH(D): A POS

## 2017-08-13 LAB — GLUCOSE, CAPILLARY: GLUCOSE-CAPILLARY: 167 mg/dL — AB (ref 70–99)

## 2017-08-13 NOTE — Pre-Procedure Instructions (Addendum)
Trista Ciocca  08/13/2017    Your procedure is scheduled on Friday, August 15, 2017 at 7:30 AM.   Report to [facility] Entrance "A" Admitting Office at 5:30 AM.   Call this number if you have problems the morning of surgery: [phone]   Questions prior to day of surgery, please call [phone] between 8 & 4 PM.   Remember:  Do not eat or drink after midnight Thursday, 08/14/17.  Take these medicines the morning of surgery with A SIP OF WATER: Citalopram (Celexa), Lamotrigine (Lamictal), Gabapentin (Neurontin), Flonase, Tylenol - if needed.  Do not take Metformin morning of surgery.  Do not use NSAIDS (Ibuprofen, Aleve, etc) or Aspirin products prior to surgery.  Do not smoke 24 hours prior to surgery.   How to Manage Your Diabetes Before Surgery   Why is it important to control my blood sugar before and after surgery?   Improving blood sugar levels before and after surgery helps healing and can limit problems.  A way of improving blood sugar control is eating a healthy diet by:  - Eating less sugar and carbohydrates  - Increasing activity/exercise  - Talk with your doctor about reaching your blood sugar goals  High blood sugars (greater than 180 mg/dL) can raise your risk of infections and slow down your recovery so you will need to focus on controlling your diabetes during the weeks before surgery.  Make sure that the doctor who takes care of your diabetes knows about your planned surgery including the date and location.  How do I manage my blood sugars before surgery?   Check your blood sugar at least 4 times a day, 2 days before surgery to make sure that they are not too high or low.  Check your blood sugar the morning of your surgery when you wake up and every 2 hours until you get to the Short-Stay unit.  Treat a low blood sugar (less than 70 mg/dL) with 1/2 cup of clear juice (cranberry or apple), 4 glucose tablets, OR glucose gel.  Recheck  blood sugar in 15 minutes after treatment (to make sure it is greater than 70 mg/dL).  If blood sugar is not greater than 70 mg/dL on re-check, call [phone] for further instructions.   Report your blood sugar to the Short-Stay nurse when you get to Short-Stay.  References:  [facility], 2007 "How to Manage your Diabetes Before and After Surgery".    Do not wear jewelry, make-up or nail polish.  Do not wear lotions, powders, perfumes or deodorant.  Men may shave face and neck.  Do not bring valuables to the hospital.  [facility] is not responsible for any belongings or valuables.  Contacts, dentures or bridgework may not be worn into surgery.  Leave your suitcase in the car.  After surgery it may be brought to your room.  For patients admitted to the hospital, discharge time will be determined by your treatment team.  [facility] - Preparing for Surgery  Before surgery, you can play an important role.  Because skin is not sterile, your skin needs to be as free of germs as possible.  You can reduce the number of germs on you skin by washing with CHG (chlorahexidine gluconate) soap before surgery.  CHG is an antiseptic cleaner which kills germs and bonds with the skin to continue killing germs even after washing.  Oral Hygiene is also important in reducing the risk of infection.  Remember  to brush your teeth with your regular toothpaste the morning of surgery.  Please DO NOT use if you have an allergy to CHG or antibacterial soaps.  If your skin becomes reddened/irritated stop using the CHG and inform your nurse when you arrive at Short Stay.  Do not shave (including legs and underarms) for at least 48 hours prior to the first CHG shower.  You may shave your face.  Please follow these instructions carefully:   1.  Shower with CHG Soap the night before surgery and the morning of Surgery.  2.  If you choose to wash your hair, wash your hair first as usual with  your normal shampoo.  3.  After you shampoo, rinse your hair and body thoroughly to remove the shampoo. 4.  Use CHG as you would any other liquid soap.  You can apply chg directly to the skin and wash gently with a      scrungie or washcloth.           5.  Apply the CHG Soap to your body ONLY FROM THE NECK DOWN.   Do not use on open wounds or open sores. Avoid contact with your eyes, ears, mouth and genitals (private parts).  Wash genitals (private parts) with your normal soap.  6.  Wash thoroughly, paying special attention to the area where your surgery will be performed.  7.  Thoroughly rinse your body with warm water from the neck down.  8.  DO NOT shower/wash with your normal soap after using and rinsing off the CHG Soap.  9.  Pat yourself dry with a clean towel.            10.  Wear clean pajamas.            11.  Place clean sheets on your bed the night of your first shower and do not sleep with pets.  Day of Surgery  Shower as above. Do not apply any lotions/deodorants the morning of surgery.   Please wear clean clothes to the hospital. Remember to brush your teeth with toothpaste.   Please read over the fact sheets that you were given.

## 2017-08-13 NOTE — Progress Notes (Signed)
Pt denies cardiac history except for a heart murmur as a baby. Pt is a type 2 Diabetic. Last A1C was 7.3 on 07/31/17. Pt states she she does not check her blood sugar at home.

## 2017-08-15 ENCOUNTER — Other Ambulatory Visit: Payer: Self-pay

## 2017-08-15 ENCOUNTER — Encounter (HOSPITAL_COMMUNITY)
Admission: RE | Disposition: A | Discharge status: Home / Self Care | Payer: Self-pay | Source: Ambulatory Visit | Attending: Thoracic Surgery (Cardiothoracic Vascular Surgery)

## 2017-08-15 ENCOUNTER — Inpatient Hospital Stay (HOSPITAL_COMMUNITY)
Admission: RE | Admit: 2017-08-15 | Discharge: 2017-08-27 | DRG: 164 | Disposition: A | Discharge status: Home / Self Care | Payer: PPO | Attending: Thoracic Surgery (Cardiothoracic Vascular Surgery) | Admitting: Thoracic Surgery (Cardiothoracic Vascular Surgery)

## 2017-08-15 ENCOUNTER — Inpatient Hospital Stay (HOSPITAL_COMMUNITY): Payer: PPO | Admitting: Certified Registered Nurse Anesthetist

## 2017-08-15 ENCOUNTER — Encounter (HOSPITAL_COMMUNITY): Payer: Self-pay | Admitting: *Deleted

## 2017-08-15 ENCOUNTER — Inpatient Hospital Stay (HOSPITAL_COMMUNITY): Payer: PPO

## 2017-08-15 DIAGNOSIS — E1151 Type 2 diabetes mellitus with diabetic peripheral angiopathy without gangrene: Secondary | ICD-10-CM | POA: Diagnosis present

## 2017-08-15 DIAGNOSIS — Z7984 Long term (current) use of oral hypoglycemic drugs: Secondary | ICD-10-CM

## 2017-08-15 DIAGNOSIS — Z8249 Family history of ischemic heart disease and other diseases of the circulatory system: Secondary | ICD-10-CM | POA: Diagnosis not present

## 2017-08-15 DIAGNOSIS — M797 Fibromyalgia: Secondary | ICD-10-CM | POA: Diagnosis present

## 2017-08-15 DIAGNOSIS — Z9049 Acquired absence of other specified parts of digestive tract: Secondary | ICD-10-CM

## 2017-08-15 DIAGNOSIS — I493 Ventricular premature depolarization: Secondary | ICD-10-CM | POA: Diagnosis not present

## 2017-08-15 DIAGNOSIS — Z79899 Other long term (current) drug therapy: Secondary | ICD-10-CM | POA: Diagnosis not present

## 2017-08-15 DIAGNOSIS — F319 Bipolar disorder, unspecified: Secondary | ICD-10-CM | POA: Diagnosis present

## 2017-08-15 DIAGNOSIS — J9382 Other air leak: Secondary | ICD-10-CM | POA: Diagnosis not present

## 2017-08-15 DIAGNOSIS — R918 Other nonspecific abnormal finding of lung field: Secondary | ICD-10-CM

## 2017-08-15 DIAGNOSIS — K219 Gastro-esophageal reflux disease without esophagitis: Secondary | ICD-10-CM | POA: Diagnosis present

## 2017-08-15 DIAGNOSIS — E785 Hyperlipidemia, unspecified: Secondary | ICD-10-CM | POA: Diagnosis present

## 2017-08-15 DIAGNOSIS — C3411 Malignant neoplasm of upper lobe, right bronchus or lung: Secondary | ICD-10-CM | POA: Diagnosis present

## 2017-08-15 DIAGNOSIS — J449 Chronic obstructive pulmonary disease, unspecified: Secondary | ICD-10-CM | POA: Diagnosis present

## 2017-08-15 DIAGNOSIS — G35 Multiple sclerosis: Secondary | ICD-10-CM | POA: Diagnosis present

## 2017-08-15 DIAGNOSIS — Z90710 Acquired absence of both cervix and uterus: Secondary | ICD-10-CM | POA: Diagnosis not present

## 2017-08-15 DIAGNOSIS — R911 Solitary pulmonary nodule: Secondary | ICD-10-CM

## 2017-08-15 DIAGNOSIS — I10 Essential (primary) hypertension: Secondary | ICD-10-CM | POA: Diagnosis present

## 2017-08-15 DIAGNOSIS — I2510 Atherosclerotic heart disease of native coronary artery without angina pectoris: Secondary | ICD-10-CM | POA: Diagnosis present

## 2017-08-15 DIAGNOSIS — D62 Acute posthemorrhagic anemia: Secondary | ICD-10-CM | POA: Diagnosis not present

## 2017-08-15 DIAGNOSIS — Z902 Acquired absence of lung [part of]: Secondary | ICD-10-CM

## 2017-08-15 DIAGNOSIS — Z09 Encounter for follow-up examination after completed treatment for conditions other than malignant neoplasm: Secondary | ICD-10-CM

## 2017-08-15 DIAGNOSIS — Z833 Family history of diabetes mellitus: Secondary | ICD-10-CM

## 2017-08-15 DIAGNOSIS — F17210 Nicotine dependence, cigarettes, uncomplicated: Secondary | ICD-10-CM | POA: Diagnosis present

## 2017-08-15 DIAGNOSIS — Z4682 Encounter for fitting and adjustment of non-vascular catheter: Secondary | ICD-10-CM

## 2017-08-15 DIAGNOSIS — E119 Type 2 diabetes mellitus without complications: Secondary | ICD-10-CM | POA: Diagnosis present

## 2017-08-15 DIAGNOSIS — J939 Pneumothorax, unspecified: Secondary | ICD-10-CM

## 2017-08-15 DIAGNOSIS — Z9889 Other specified postprocedural states: Secondary | ICD-10-CM

## 2017-08-15 HISTORY — DX: Type 2 diabetes mellitus without complications: E11.9

## 2017-08-15 HISTORY — PX: VIDEO ASSISTED THORACOSCOPY (VATS)/WEDGE RESECTION: SHX6174

## 2017-08-15 HISTORY — DX: Chronic sinusitis, unspecified: J32.9

## 2017-08-15 HISTORY — DX: Migraine, unspecified, not intractable, without status migrainosus: G43.909

## 2017-08-15 HISTORY — DX: Other chronic pain: G89.29

## 2017-08-15 HISTORY — DX: Malignant neoplasm of upper lobe, right bronchus or lung: C34.11

## 2017-08-15 HISTORY — DX: Other seasonal allergic rhinitis: J30.2

## 2017-08-15 HISTORY — DX: Cervicalgia: M54.2

## 2017-08-15 HISTORY — PX: VIDEO ASSISTED THORACOSCOPY (VATS)/ LOBECTOMY: SHX6169

## 2017-08-15 LAB — GLUCOSE, CAPILLARY
GLUCOSE-CAPILLARY: 149 mg/dL — AB (ref 70–99)
Glucose-Capillary: 141 mg/dL — ABNORMAL HIGH (ref 70–99)
Glucose-Capillary: 245 mg/dL — ABNORMAL HIGH (ref 70–99)
Glucose-Capillary: 204 mg/dL — ABNORMAL HIGH (ref 70–99)
Glucose-Capillary: 137 mg/dL — ABNORMAL HIGH (ref 70–99)
Glucose-Capillary: 130 mg/dL — ABNORMAL HIGH (ref 70–99)

## 2017-08-15 SURGERY — VIDEO ASSISTED THORACOSCOPY (VATS)/WEDGE RESECTION
Anesthesia: General | Site: Chest | Laterality: Right

## 2017-08-15 MED ORDER — ONDANSETRON HCL 4 MG/2ML IJ SOLN
INTRAMUSCULAR | Status: AC
  Filled 2017-08-15: qty 2

## 2017-08-15 MED ORDER — MIDAZOLAM HCL 2 MG/2ML IJ SOLN
INTRAMUSCULAR | Status: AC
  Filled 2017-08-15: qty 2

## 2017-08-15 MED ORDER — SODIUM CHLORIDE 0.9% FLUSH
10.0000 mL | Freq: Two times a day (BID) | INTRAVENOUS | Status: DC
  Administered 2017-08-15 – 2017-08-21 (×12): 10 mL
  Administered 2017-08-21: 20 mL
  Administered 2017-08-22: 10 mL
  Administered 2017-08-22: 30 mL
  Administered 2017-08-23: 20 mL
  Administered 2017-08-23 – 2017-08-24 (×3): 10 mL
  Administered 2017-08-25: 20 mL
  Administered 2017-08-25 – 2017-08-26 (×2): 10 mL

## 2017-08-15 MED ORDER — FENOFIBRATE 160 MG PO TABS
160.0000 mg | ORAL_TABLET | Freq: Every day | ORAL | Status: DC
  Administered 2017-08-16 – 2017-08-27 (×12): 160 mg via ORAL
  Filled 2017-08-15 (×12): qty 1

## 2017-08-15 MED ORDER — ACETAMINOPHEN 500 MG PO TABS
1000.0000 mg | ORAL_TABLET | Freq: Four times a day (QID) | ORAL | Status: AC
  Administered 2017-08-15 – 2017-08-20 (×18): 1000 mg via ORAL
  Filled 2017-08-15 (×18): qty 2

## 2017-08-15 MED ORDER — FENTANYL 40 MCG/ML IV SOLN
INTRAVENOUS | Status: DC
  Administered 2017-08-15: 45 ug via INTRAVENOUS
  Administered 2017-08-15: 1000 ug via INTRAVENOUS
  Administered 2017-08-15: 15 ug via INTRAVENOUS
  Administered 2017-08-16: 75 ug via INTRAVENOUS
  Administered 2017-08-16: 90 ug via INTRAVENOUS
  Administered 2017-08-16: 45 ug via INTRAVENOUS
  Administered 2017-08-16: 180 ug via INTRAVENOUS
  Administered 2017-08-16: 105 ug via INTRAVENOUS
  Administered 2017-08-16: 45 ug via INTRAVENOUS
  Administered 2017-08-17: 15 ug via INTRAVENOUS
  Administered 2017-08-17: 180 ug via INTRAVENOUS
  Administered 2017-08-17: 06:00:00 via INTRAVENOUS
  Administered 2017-08-17: 60 ug via INTRAVENOUS
  Administered 2017-08-17: 75 ug via INTRAVENOUS
  Administered 2017-08-17: 45 ug via INTRAVENOUS
  Administered 2017-08-17: 75 ug via INTRAVENOUS
  Administered 2017-08-18: 60 ug via INTRAVENOUS
  Administered 2017-08-18: 90 ug via INTRAVENOUS
  Administered 2017-08-18: 75 ug via INTRAVENOUS
  Administered 2017-08-18: 60 ug via INTRAVENOUS
  Administered 2017-08-19: 45 ug via INTRAVENOUS
  Administered 2017-08-19: 30 ug via INTRAVENOUS
  Administered 2017-08-19: 45 ug via INTRAVENOUS
  Administered 2017-08-19: 15 ug via INTRAVENOUS
  Administered 2017-08-19: 75 ug via INTRAVENOUS
  Administered 2017-08-19: 0 ug via INTRAVENOUS
  Administered 2017-08-20: 120 ug via INTRAVENOUS
  Administered 2017-08-20: 45 ug via INTRAVENOUS
  Administered 2017-08-20: 30 ug via INTRAVENOUS
  Administered 2017-08-20: 90 ug via INTRAVENOUS
  Administered 2017-08-20: 60 ug via INTRAVENOUS
  Administered 2017-08-20: 135 ug via INTRAVENOUS
  Administered 2017-08-20: 01:00:00 via INTRAVENOUS
  Administered 2017-08-21 (×2): 15 ug via INTRAVENOUS
  Filled 2017-08-15 (×3): qty 25

## 2017-08-15 MED ORDER — SODIUM CHLORIDE 0.45 % IV SOLN
INTRAVENOUS | Status: DC
  Administered 2017-08-15 – 2017-08-19 (×4): via INTRAVENOUS

## 2017-08-15 MED ORDER — PANTOPRAZOLE SODIUM 40 MG PO TBEC
40.0000 mg | DELAYED_RELEASE_TABLET | Freq: Every day | ORAL | Status: DC
  Administered 2017-08-16 – 2017-08-27 (×12): 40 mg via ORAL
  Filled 2017-08-15 (×12): qty 1

## 2017-08-15 MED ORDER — ORAL CARE MOUTH RINSE
15.0000 mL | Freq: Two times a day (BID) | OROMUCOSAL | Status: DC
  Administered 2017-08-16 – 2017-08-26 (×16): 15 mL via OROMUCOSAL

## 2017-08-15 MED ORDER — HYDROMORPHONE HCL 1 MG/ML IJ SOLN
INTRAMUSCULAR | Status: AC
  Filled 2017-08-15: qty 0.5

## 2017-08-15 MED ORDER — ONDANSETRON HCL 4 MG/2ML IJ SOLN
INTRAMUSCULAR | Status: DC | PRN
  Administered 2017-08-15: 4 mg via INTRAVENOUS

## 2017-08-15 MED ORDER — NALOXONE HCL 0.4 MG/ML IJ SOLN: 0.4000 mg | INTRAMUSCULAR | Status: DC | PRN

## 2017-08-15 MED ORDER — DIPHENHYDRAMINE HCL 50 MG/ML IJ SOLN: 12.5000 mg | Freq: Four times a day (QID) | INTRAMUSCULAR | Status: DC | PRN

## 2017-08-15 MED ORDER — TRAMADOL HCL 50 MG PO TABS
50.0000 mg | ORAL_TABLET | Freq: Four times a day (QID) | ORAL | Status: DC | PRN
  Administered 2017-08-22 – 2017-08-25 (×2): 100 mg via ORAL
  Filled 2017-08-15 (×2): qty 2

## 2017-08-15 MED ORDER — INSULIN ASPART 100 UNIT/ML ~~LOC~~ SOLN
0.0000 [IU] | Freq: Four times a day (QID) | SUBCUTANEOUS | Status: DC
  Administered 2017-08-15: 8 [IU] via SUBCUTANEOUS
  Administered 2017-08-15 – 2017-08-16 (×3): 2 [IU] via SUBCUTANEOUS

## 2017-08-15 MED ORDER — SODIUM CHLORIDE 0.9% FLUSH
10.0000 mL | INTRAVENOUS | Status: DC | PRN
  Administered 2017-08-27: 10 mL
  Filled 2017-08-15: qty 40

## 2017-08-15 MED ORDER — SENNOSIDES-DOCUSATE SODIUM 8.6-50 MG PO TABS
1.0000 | ORAL_TABLET | Freq: Every day | ORAL | Status: DC
  Administered 2017-08-15 – 2017-08-26 (×10): 1 via ORAL
  Filled 2017-08-15 (×11): qty 1

## 2017-08-15 MED ORDER — ENOXAPARIN SODIUM 40 MG/0.4ML ~~LOC~~ SOLN
40.0000 mg | Freq: Every day | SUBCUTANEOUS | Status: DC
  Administered 2017-08-16 – 2017-08-27 (×12): 40 mg via SUBCUTANEOUS
  Filled 2017-08-15 (×12): qty 0.4

## 2017-08-15 MED ORDER — INSULIN ASPART 100 UNIT/ML ~~LOC~~ SOLN
SUBCUTANEOUS | Status: AC
  Administered 2017-08-16: 2 [IU]
  Filled 2017-08-15: qty 1

## 2017-08-15 MED ORDER — FENTANYL CITRATE (PF) 100 MCG/2ML IJ SOLN
INTRAMUSCULAR | Status: DC | PRN
  Administered 2017-08-15 (×2): 50 ug via INTRAVENOUS
  Administered 2017-08-15: 100 ug via INTRAVENOUS
  Administered 2017-08-15 (×3): 50 ug via INTRAVENOUS
  Administered 2017-08-15 (×2): 100 ug via INTRAVENOUS
  Administered 2017-08-15: 50 ug via INTRAVENOUS
  Administered 2017-08-15: 100 ug via INTRAVENOUS

## 2017-08-15 MED ORDER — SODIUM CHLORIDE 0.9% FLUSH: 9.0000 mL | INTRAVENOUS | Status: DC | PRN

## 2017-08-15 MED ORDER — BISACODYL 5 MG PO TBEC
10.0000 mg | DELAYED_RELEASE_TABLET | Freq: Every day | ORAL | Status: DC
  Administered 2017-08-16 – 2017-08-27 (×10): 10 mg via ORAL
  Filled 2017-08-15 (×10): qty 2

## 2017-08-15 MED ORDER — 0.9 % SODIUM CHLORIDE (POUR BTL) OPTIME
TOPICAL | Status: DC | PRN
  Administered 2017-08-15: 1000 mL

## 2017-08-15 MED ORDER — PHENYLEPHRINE 40 MCG/ML (10ML) SYRINGE FOR IV PUSH (FOR BLOOD PRESSURE SUPPORT)
PREFILLED_SYRINGE | INTRAVENOUS | Status: AC
  Filled 2017-08-15: qty 10

## 2017-08-15 MED ORDER — MIDAZOLAM HCL 5 MG/5ML IJ SOLN
INTRAMUSCULAR | Status: DC | PRN
  Administered 2017-08-15 (×2): 2 mg via INTRAVENOUS

## 2017-08-15 MED ORDER — LIDOCAINE 2% (20 MG/ML) 5 ML SYRINGE
INTRAMUSCULAR | Status: AC
  Filled 2017-08-15: qty 5

## 2017-08-15 MED ORDER — ACETAMINOPHEN 160 MG/5ML PO SOLN: 1000.0000 mg | Freq: Four times a day (QID) | ORAL | Status: AC

## 2017-08-15 MED ORDER — ALBUTEROL SULFATE (2.5 MG/3ML) 0.083% IN NEBU
2.5000 mg | INHALATION_SOLUTION | RESPIRATORY_TRACT | Status: DC
  Administered 2017-08-16: 2.5 mg via RESPIRATORY_TRACT
  Filled 2017-08-15: qty 3

## 2017-08-15 MED ORDER — BUPIVACAINE HCL (PF) 0.5 % IJ SOLN
INTRAMUSCULAR | Status: DC | PRN
  Administered 2017-08-15: 30 mL

## 2017-08-15 MED ORDER — FLUTICASONE PROPIONATE 50 MCG/ACT NA SUSP
2.0000 | Freq: Every day | NASAL | Status: DC
  Administered 2017-08-16 – 2017-08-26 (×11): 2 via NASAL
  Filled 2017-08-15: qty 16

## 2017-08-15 MED ORDER — LACTATED RINGERS IV SOLN
INTRAVENOUS | Status: DC | PRN
  Administered 2017-08-15: 07:00:00 via INTRAVENOUS

## 2017-08-15 MED ORDER — FENTANYL CITRATE (PF) 250 MCG/5ML IJ SOLN
INTRAMUSCULAR | Status: AC
  Filled 2017-08-15: qty 5

## 2017-08-15 MED ORDER — OXYCODONE HCL 5 MG PO TABS: 5.0000 mg | ORAL_TABLET | Freq: Once | ORAL | Status: DC | PRN

## 2017-08-15 MED ORDER — HEMOSTATIC AGENTS (NO CHARGE) OPTIME
TOPICAL | Status: DC | PRN
  Administered 2017-08-15: 1 via TOPICAL

## 2017-08-15 MED ORDER — ESMOLOL HCL 100 MG/10ML IV SOLN
INTRAVENOUS | Status: DC | PRN
  Administered 2017-08-15: 30 mg via INTRAVENOUS

## 2017-08-15 MED ORDER — BUPIVACAINE HCL (PF) 0.5 % IJ SOLN
INTRAMUSCULAR | Status: AC
  Filled 2017-08-15: qty 30

## 2017-08-15 MED ORDER — ROCURONIUM BROMIDE 10 MG/ML (PF) SYRINGE
PREFILLED_SYRINGE | INTRAVENOUS | Status: AC
  Filled 2017-08-15: qty 10

## 2017-08-15 MED ORDER — LORATADINE 10 MG PO TABS
10.0000 mg | ORAL_TABLET | Freq: Every day | ORAL | Status: DC
  Administered 2017-08-16 – 2017-08-27 (×12): 10 mg via ORAL
  Filled 2017-08-15 (×12): qty 1

## 2017-08-15 MED ORDER — MIRTAZAPINE 30 MG PO TABS
30.0000 mg | ORAL_TABLET | Freq: Every day | ORAL | Status: DC
  Administered 2017-08-16 – 2017-08-26 (×11): 30 mg via ORAL
  Filled 2017-08-15: qty 4
  Filled 2017-08-15 (×2): qty 1
  Filled 2017-08-15: qty 4
  Filled 2017-08-15: qty 1
  Filled 2017-08-15 (×2): qty 4
  Filled 2017-08-15: qty 1
  Filled 2017-08-15: qty 4
  Filled 2017-08-15 (×2): qty 1

## 2017-08-15 MED ORDER — LAMOTRIGINE 100 MG PO TABS
100.0000 mg | ORAL_TABLET | Freq: Every day | ORAL | Status: DC
  Administered 2017-08-16 – 2017-08-27 (×12): 100 mg via ORAL
  Filled 2017-08-15 (×12): qty 1

## 2017-08-15 MED ORDER — ONDANSETRON HCL 4 MG/2ML IJ SOLN
4.0000 mg | Freq: Four times a day (QID) | INTRAMUSCULAR | Status: DC | PRN
  Administered 2017-08-20: 4 mg via INTRAVENOUS
  Filled 2017-08-15: qty 2

## 2017-08-15 MED ORDER — HYDROMORPHONE HCL 1 MG/ML IJ SOLN
INTRAMUSCULAR | Status: DC | PRN
  Administered 2017-08-15: 0.5 mg via INTRAVENOUS

## 2017-08-15 MED ORDER — CITALOPRAM HYDROBROMIDE 20 MG PO TABS
40.0000 mg | ORAL_TABLET | Freq: Every day | ORAL | Status: DC
  Administered 2017-08-16 – 2017-08-27 (×12): 40 mg via ORAL
  Filled 2017-08-15 (×12): qty 2

## 2017-08-15 MED ORDER — BUPIVACAINE LIPOSOME 1.3 % IJ SUSP
20.0000 mL | INTRAMUSCULAR | Status: AC
  Administered 2017-08-15: 20 mL
  Filled 2017-08-15: qty 20

## 2017-08-15 MED ORDER — CEFAZOLIN SODIUM-DEXTROSE 2-4 GM/100ML-% IV SOLN
2.0000 g | Freq: Three times a day (TID) | INTRAVENOUS | Status: AC
  Administered 2017-08-15 – 2017-08-16 (×2): 2 g via INTRAVENOUS
  Filled 2017-08-15 (×2): qty 100

## 2017-08-15 MED ORDER — OXYCODONE HCL 5 MG/5ML PO SOLN: 5.0000 mg | Freq: Once | ORAL | Status: DC | PRN

## 2017-08-15 MED ORDER — SUGAMMADEX SODIUM 200 MG/2ML IV SOLN
INTRAVENOUS | Status: DC | PRN
  Administered 2017-08-15: 200 mg via INTRAVENOUS

## 2017-08-15 MED ORDER — GABAPENTIN 300 MG PO CAPS
300.0000 mg | ORAL_CAPSULE | Freq: Every day | ORAL | Status: DC
  Administered 2017-08-16 – 2017-08-26 (×11): 300 mg via ORAL
  Filled 2017-08-15 (×11): qty 1

## 2017-08-15 MED ORDER — KETOCONAZOLE 2 % EX CREA
1.0000 "application " | TOPICAL_CREAM | Freq: Two times a day (BID) | CUTANEOUS | Status: DC
  Administered 2017-08-15 – 2017-08-25 (×16): 1 via TOPICAL
  Filled 2017-08-15: qty 15

## 2017-08-15 MED ORDER — LACTATED RINGERS IV SOLN
INTRAVENOUS | Status: DC | PRN
  Administered 2017-08-15 (×2): via INTRAVENOUS

## 2017-08-15 MED ORDER — DIPHENHYDRAMINE HCL 12.5 MG/5ML PO ELIX
12.5000 mg | ORAL_SOLUTION | Freq: Four times a day (QID) | ORAL | Status: DC | PRN
  Filled 2017-08-15 (×2): qty 5

## 2017-08-15 MED ORDER — FLUMAZENIL 0.5 MG/5ML IV SOLN
INTRAVENOUS | Status: AC
  Filled 2017-08-15: qty 5

## 2017-08-15 MED ORDER — LUNG SURGERY BOOK
Freq: Once | Status: AC
  Administered 2017-08-15: 1
  Filled 2017-08-15: qty 1

## 2017-08-15 MED ORDER — ONDANSETRON HCL 4 MG/2ML IJ SOLN: 4.0000 mg | Freq: Four times a day (QID) | INTRAMUSCULAR | Status: DC | PRN

## 2017-08-15 MED ORDER — DEXMEDETOMIDINE HCL 200 MCG/2ML IV SOLN
INTRAVENOUS | Status: DC | PRN
  Administered 2017-08-15 (×2): 12 ug via INTRAVENOUS

## 2017-08-15 MED ORDER — EZETIMIBE 10 MG PO TABS
10.0000 mg | ORAL_TABLET | Freq: Every evening | ORAL | Status: DC
  Administered 2017-08-16 – 2017-08-26 (×11): 10 mg via ORAL
  Filled 2017-08-15 (×11): qty 1

## 2017-08-15 MED ORDER — HYDROMORPHONE HCL 1 MG/ML IJ SOLN
INTRAMUSCULAR | Status: AC
  Filled 2017-08-15: qty 1

## 2017-08-15 MED ORDER — OXYCODONE HCL 5 MG PO TABS
5.0000 mg | ORAL_TABLET | ORAL | Status: DC | PRN
  Administered 2017-08-21 – 2017-08-26 (×7): 10 mg via ORAL
  Administered 2017-08-26: 5 mg via ORAL
  Filled 2017-08-15 (×6): qty 2
  Filled 2017-08-15: qty 1
  Filled 2017-08-15: qty 2

## 2017-08-15 MED ORDER — METFORMIN HCL ER 500 MG PO TB24
500.0000 mg | ORAL_TABLET | Freq: Every day | ORAL | Status: DC
  Administered 2017-08-16 – 2017-08-26 (×11): 500 mg via ORAL
  Filled 2017-08-15 (×12): qty 1

## 2017-08-15 MED ORDER — HYDROMORPHONE HCL 1 MG/ML IJ SOLN
0.2500 mg | INTRAMUSCULAR | Status: DC | PRN
  Administered 2017-08-15: 0.25 mg via INTRAVENOUS

## 2017-08-15 MED ORDER — SODIUM CHLORIDE 0.9 % IJ SOLN
INTRAMUSCULAR | Status: DC | PRN
  Administered 2017-08-15: 50 mL via INTRAVENOUS

## 2017-08-15 MED ORDER — SALINE SPRAY 0.65 % NA SOLN
1.0000 | NASAL | Status: DC | PRN
  Administered 2017-08-24 (×2): 1 via NASAL
  Filled 2017-08-15: qty 44

## 2017-08-15 MED ORDER — POTASSIUM CHLORIDE 10 MEQ/50ML IV SOLN
10.0000 meq | Freq: Every day | INTRAVENOUS | Status: DC | PRN
  Administered 2017-08-17: 10 meq via INTRAVENOUS
  Filled 2017-08-15 (×2): qty 50

## 2017-08-15 MED ORDER — DEXAMETHASONE SODIUM PHOSPHATE 10 MG/ML IJ SOLN
INTRAMUSCULAR | Status: DC | PRN
  Administered 2017-08-15: 10 mg via INTRAVENOUS

## 2017-08-15 MED ORDER — FLUMAZENIL 0.5 MG/5ML IV SOLN
INTRAVENOUS | Status: DC | PRN
  Administered 2017-08-15: 0.3 mg via INTRAVENOUS

## 2017-08-15 MED ORDER — PROPOFOL 10 MG/ML IV BOLUS
INTRAVENOUS | Status: DC | PRN
  Administered 2017-08-15: 200 mg via INTRAVENOUS

## 2017-08-15 MED ORDER — CEFAZOLIN SODIUM-DEXTROSE 2-4 GM/100ML-% IV SOLN
2.0000 g | INTRAVENOUS | Status: AC
  Administered 2017-08-15: 2 g via INTRAVENOUS
  Filled 2017-08-15: qty 100

## 2017-08-15 MED ORDER — ROCURONIUM BROMIDE 10 MG/ML (PF) SYRINGE
PREFILLED_SYRINGE | INTRAVENOUS | Status: DC | PRN
  Administered 2017-08-15: 80 mg via INTRAVENOUS
  Administered 2017-08-15: 10 mg via INTRAVENOUS

## 2017-08-15 MED ORDER — PROPOFOL 10 MG/ML IV BOLUS
INTRAVENOUS | Status: AC
  Filled 2017-08-15: qty 20

## 2017-08-15 SURGICAL SUPPLY — 99 items
ADH SKN CLS APL DERMABOND .7 (GAUZE/BANDAGES/DRESSINGS) ×1
APL SWBSTK 6 STRL LF DISP (MISCELLANEOUS) ×1
APPLICATOR COTTON TIP 6 STRL (MISCELLANEOUS) ×1 IMPLANT
APPLICATOR COTTON TIP 6IN STRL (MISCELLANEOUS) ×3
APPLIER CLIP ROT 10 11.4 M/L (STAPLE)
APR CLP MED LRG 11.4X10 (STAPLE)
BAG SPEC RTRVL LRG 6X4 10 (ENDOMECHANICALS) ×1
CANISTER SUCT 3000ML PPV (MISCELLANEOUS) ×6 IMPLANT
CATH THORACIC 28FR (CATHETERS) IMPLANT
CATH THORACIC 28FR RT ANG (CATHETERS) IMPLANT
CATH THORACIC 36FR (CATHETERS) IMPLANT
CATH THORACIC 36FR RT ANG (CATHETERS) IMPLANT
CLIP APPLIE ROT 10 11.4 M/L (STAPLE) IMPLANT
CLIP VESOCCLUDE MED 6/CT (CLIP) ×6 IMPLANT
CONN ST 1/4X3/8  BEN (MISCELLANEOUS)
CONN ST 1/4X3/8 BEN (MISCELLANEOUS) IMPLANT
CONN Y 3/8X3/8X3/8  BEN (MISCELLANEOUS)
CONN Y 3/8X3/8X3/8 BEN (MISCELLANEOUS) IMPLANT
CONT SPEC 4OZ CLIKSEAL STRL BL (MISCELLANEOUS) ×24 IMPLANT
COVER SURGICAL LIGHT HANDLE (MISCELLANEOUS) ×3 IMPLANT
DERMABOND ADVANCED (GAUZE/BANDAGES/DRESSINGS) ×2
DERMABOND ADVANCED .7 DNX12 (GAUZE/BANDAGES/DRESSINGS) ×1 IMPLANT
DRAIN CHANNEL 28F RND 3/8 FF (WOUND CARE) IMPLANT
DRAIN CHANNEL 32F RND 10.7 FF (WOUND CARE) IMPLANT
DRAPE LAPAROSCOPIC ABDOMINAL (DRAPES) ×3 IMPLANT
DRAPE WARM FLUID 44X44 (DRAPE) ×3 IMPLANT
ELECT BLADE 6.5 EXT (BLADE) ×3 IMPLANT
ELECT REM PT RETURN 9FT ADLT (ELECTROSURGICAL) ×3
ELECTRODE REM PT RTRN 9FT ADLT (ELECTROSURGICAL) ×1 IMPLANT
GAUZE SPONGE 4X4 12PLY STRL (GAUZE/BANDAGES/DRESSINGS) ×3 IMPLANT
GAUZE SPONGE 4X4 12PLY STRL LF (GAUZE/BANDAGES/DRESSINGS) ×3 IMPLANT
GLOVE SURG SIGNA 7.5 PF LTX (GLOVE) ×6 IMPLANT
GOWN STRL REUS W/ TWL LRG LVL3 (GOWN DISPOSABLE) ×5 IMPLANT
GOWN STRL REUS W/ TWL XL LVL3 (GOWN DISPOSABLE) IMPLANT
GOWN STRL REUS W/TWL LRG LVL3 (GOWN DISPOSABLE) ×15
GOWN STRL REUS W/TWL XL LVL3 (GOWN DISPOSABLE)
HEMOSTAT SURGICEL 2X14 (HEMOSTASIS) IMPLANT
KIT BASIN OR (CUSTOM PROCEDURE TRAY) ×3 IMPLANT
KIT SUCTION CATH 14FR (SUCTIONS) ×3 IMPLANT
KIT TURNOVER KIT B (KITS) ×3 IMPLANT
NEEDLE HYPO 25GX1X1/2 BEV (NEEDLE) ×3 IMPLANT
NEEDLE SPNL 18GX3.5 QUINCKE PK (NEEDLE) ×3 IMPLANT
NEEDLE SPNL 22GX3.5 QUINCKE BK (NEEDLE) ×3 IMPLANT
NS IRRIG 1000ML POUR BTL (IV SOLUTION) ×9 IMPLANT
PACK CHEST (CUSTOM PROCEDURE TRAY) ×3 IMPLANT
PAD ARMBOARD 7.5X6 YLW CONV (MISCELLANEOUS) ×9 IMPLANT
POUCH ENDO CATCH II 15MM (MISCELLANEOUS) ×3 IMPLANT
POUCH SPECIMEN RETRIEVAL 10MM (ENDOMECHANICALS) ×3 IMPLANT
RELOAD STAPLER GOLD 60MM (STAPLE) ×11 IMPLANT
RELOAD STAPLER GREEN 60MM (STAPLE) ×1 IMPLANT
SCISSORS ENDO CVD 5DCS (MISCELLANEOUS) IMPLANT
SEALANT PROGEL (MISCELLANEOUS) IMPLANT
SEALANT SURG COSEAL 4ML (VASCULAR PRODUCTS) IMPLANT
SEALANT SURG COSEAL 8ML (VASCULAR PRODUCTS) IMPLANT
SHEARS HARMONIC HDI 20CM (ELECTROSURGICAL) ×3 IMPLANT
SHEARS HARMONIC HDI 36CM (ELECTROSURGICAL) IMPLANT
SOLUTION ANTI FOG 6CC (MISCELLANEOUS) ×3 IMPLANT
SPECIMEN JAR MEDIUM (MISCELLANEOUS) ×3 IMPLANT
SPONGE INTESTINAL PEANUT (DISPOSABLE) ×3 IMPLANT
SPONGE TONSIL TAPE 1 RFD (DISPOSABLE) ×3 IMPLANT
STAPLE ECHEON FLEX 60 POW ENDO (STAPLE) ×3 IMPLANT
STAPLE RELOAD 2.5MM WHITE (STAPLE) ×12 IMPLANT
STAPLER RELOAD GOLD 60MM (STAPLE) ×33
STAPLER RELOAD GREEN 60MM (STAPLE) ×3
STAPLER VASCULAR ECHELON 35 (CUTTER) ×3 IMPLANT
SUT PROLENE 4 0 RB 1 (SUTURE) ×9
SUT PROLENE 4-0 RB1 .5 CRCL 36 (SUTURE) ×3 IMPLANT
SUT SILK  1 MH (SUTURE) ×4
SUT SILK 1 MH (SUTURE) ×2 IMPLANT
SUT SILK 1 TIES 10X30 (SUTURE) ×3 IMPLANT
SUT SILK 2 0 SH (SUTURE) IMPLANT
SUT SILK 2 0SH CR/8 30 (SUTURE) IMPLANT
SUT SILK 3 0 SH 30 (SUTURE) IMPLANT
SUT SILK 3 0SH CR/8 30 (SUTURE) ×3 IMPLANT
SUT VIC AB 0 CTX 27 (SUTURE) IMPLANT
SUT VIC AB 1 CTX 27 (SUTURE) ×3 IMPLANT
SUT VIC AB 1 CTX 36 (SUTURE) ×3
SUT VIC AB 1 CTX36XBRD ANBCTR (SUTURE) ×1 IMPLANT
SUT VIC AB 2-0 CT1 27 (SUTURE)
SUT VIC AB 2-0 CT1 TAPERPNT 27 (SUTURE) IMPLANT
SUT VIC AB 2-0 CTX 36 (SUTURE) ×6 IMPLANT
SUT VIC AB 3-0 MH 27 (SUTURE) IMPLANT
SUT VIC AB 3-0 SH 27 (SUTURE)
SUT VIC AB 3-0 SH 27X BRD (SUTURE) IMPLANT
SUT VIC AB 3-0 X1 27 (SUTURE) ×3 IMPLANT
SUT VICRYL 0 UR6 27IN ABS (SUTURE) IMPLANT
SUT VICRYL 2 TP 1 (SUTURE) IMPLANT
SYR 10ML LL (SYRINGE) ×3 IMPLANT
SYR 30ML LL (SYRINGE) ×3 IMPLANT
SYSTEM SAHARA CHEST DRAIN ATS (WOUND CARE) ×3 IMPLANT
TAPE CLOTH 4X10 WHT NS (GAUZE/BANDAGES/DRESSINGS) ×3 IMPLANT
TAPE PAPER 2X10 WHT MICROPORE (GAUZE/BANDAGES/DRESSINGS) ×3 IMPLANT
TIP APPLICATOR SPRAY EXTEND 16 (VASCULAR PRODUCTS) IMPLANT
TOWEL GREEN STERILE (TOWEL DISPOSABLE) ×3 IMPLANT
TOWEL GREEN STERILE FF (TOWEL DISPOSABLE) ×3 IMPLANT
TRAY FOLEY MTR SLVR 16FR STAT (SET/KITS/TRAYS/PACK) ×3 IMPLANT
TROCAR XCEL BLADELESS 5X75MML (TROCAR) ×3 IMPLANT
TROCAR XCEL NON-BLD 5MMX100MML (ENDOMECHANICALS) IMPLANT
WATER STERILE IRR 1000ML POUR (IV SOLUTION) ×6 IMPLANT

## 2017-08-15 NOTE — Anesthesia Procedure Notes (Addendum)
Central Venous Catheter Insertion Performed by: Lillia Abed, MD, anesthesiologist Start/End7/26/2019 7:00 AM, 08/15/2017 7:10 AM Patient location: Pre-op. Preanesthetic checklist: patient identified, IV checked, risks and benefits discussed, surgical consent, monitors and equipment checked, pre-op evaluation, timeout performed and anesthesia consent Position: Trendelenburg Lidocaine 1% used for infiltration and patient sedated Hand hygiene performed  and maximum sterile barriers used  Catheter size: 8 Fr Total catheter length 16. Central line was placed.Double lumen Procedure performed using ultrasound guided technique. Ultrasound Notes:anatomy identified, needle tip was noted to be adjacent to the nerve/plexus identified, no ultrasound evidence of intravascular and/or intraneural injection and image(s) printed for medical record Attempts: 1 Following insertion, dressing applied, line sutured and Biopatch. Post procedure assessment: blood return through all ports  Patient tolerated the procedure well with no immediate complications.

## 2017-08-15 NOTE — Anesthesia Procedure Notes (Signed)
Procedure Name: Intubation Date/Time: 08/15/2017 7:43 AM Performed by: Mysty Kielty T, CRNA Pre-anesthesia Checklist: Patient identified, Emergency Drugs available, Suction available and Patient being monitored Patient Re-evaluated:Patient Re-evaluated prior to induction Oxygen Delivery Method: Circle system utilized Preoxygenation: Pre-oxygenation with 100% oxygen Induction Type: IV induction Ventilation: Mask ventilation without difficulty and Oral airway inserted - appropriate to patient size Laryngoscope Size: Mac and 3 Grade View: Grade I Endobronchial tube: Left, Double lumen EBT, EBT position confirmed by auscultation and EBT position confirmed by fiberoptic bronchoscope and 39 Fr Number of attempts: 1 Airway Equipment and Method: Patient positioned with wedge pillow and Stylet Placement Confirmation: ETT inserted through vocal cords under direct vision,  positive ETCO2 and breath sounds checked- equal and bilateral Secured at: 29 cm Tube secured with: Tape Dental Injury: Teeth and Oropharynx as per pre-operative assessment

## 2017-08-15 NOTE — Op Note (Signed)
NAME: Audrey Russell, REPSHER MEDICAL RECORD [id number] ACCOUNT [id number] DATE OF BIRTH:1955-07-21 FACILITY: [facility] LOCATION: [facility] PHYSICIAN:Darean Rote Chaya Jan, MD  OPERATIVE REPORT  DATE OF PROCEDURE:  08/15/2017  PREOPERATIVE DIAGNOSIS:  Cavitary right upper lobe lung nodule, suspected clinical stage IB(T2N0) nonsmall cell carcinoma.  POSTOPERATIVE DIAGNOSIS:  Clinical stage 1B (T2, N0) adenocarcinoma, right upper lobe.  PROCEDURE:   Right video-assisted thoracoscopy, Wedge resection right upper lobe nodule, Thoracoscopic right upper lobectomy, Mediastinal lymph node dissection, Intercostal nerve block.  SURGEON:  Modesto Charon, MD   ASSISTANT:  Jadene Pierini, PA  ANESTHESIA:  General.  FINDINGS:  Mass in the lateral aspect of the right upper lobe.  Frozen section positive for adenocarcinoma.  Bronchial margin negative for tumor.  Severe fibrotic reaction around otherwise benign appearing lymph nodes.  CLINICAL NOTE:  The patient is a 62 year old woman with a history of tobacco abuse and COPD.  She developed a persistent cough earlier this year.  A chest x-ray in February showed a vague patchy opacity in the right lung.  A repeat chest x-ray in April  was unchanged.  A CT of the chest showed a 3.4 cm x 2.9 cm cavitary lesion in the right upper lobe suspicious for a non-small cell carcinoma.  Workup for TB was negative on PET CT.  The nodule was hypermetabolic with an SUV of 3.3.  She was advised to  undergo right video-assisted thoracoscopy for wedge resection to be followed by upper lobectomy if the frozen section revealed nonsmall cell carcinoma.  The indications, risks, benefits, and alternatives were discussed in detail with the patient.  She  understood and accepted the risks and agreed to proceed.  DESCRIPTION OF PROCEDURE:  The patient was brought to the preoperative holding area on 08/15/2017.  Anesthesia placed a central venous catheter and an arterial blood  pressure monitoring line.  She was taken to the Operating Room, anesthetized and  intubated with a double lumen endotracheal tube.  Intravenous antibiotics were administered.  A Foley catheter was placed.  Sequential compression devices were placed on the calves for DVT prophylaxis.  She was placed in a left lateral decubitus position  and the right chest was prepped and draped in the usual sterile fashion.  Single lung ventilation of the left lung was initiated and was tolerated well throughout the procedure.  A timeout was performed.  An incision was made in the seventh interspace in the midaxillary line.  Prior to making the incision, the area was infiltrated with a bupivacaine solution.  The solution contained 20 mL of liposomal bupivacaine, 30 mL of 0.5%  bupivacaine and 50 mL of saline.  Approximately 5 mL were used at the port site.  A 5 mm port was placed into the chest and the thoracoscope was advanced into the chest.  Initially, there was some cross ventilation. Adjustments to the endotracheal tube  resulted in good isolation of the right lung.  There were no further issues.  The area overlying the fourth interspace then was infiltrated with bupivacaine solution and a 5 cm working incision was made.  No rib spreading was performed during the  procedure.  Intercostal nerve blocks were performed.  Blocks were placed in the interspaces 3 through 10.  The needle was slowly advanced while injecting the bupivacaine solution into a subpleural plane, 10 mL was injected at each interspace.  Inspection  of the lung revealed puckering of the visceral pleura on the lateral aspect of the right upper lobe at the site where the nodule  was noted on CT.  A wedge resection was performed with sequential firings of an Echelon powered stapler using 60 mm gold  cartridges.  The specimen was placed into an endoscopic retrieval bag, removed and sent for frozen section.  The frozen section subsequently returned showing  adenocarcinoma.  The decision was made to proceed with lobectomy as discussed with the patient  preoperatively.  DESCRIPTION OF PROCEDURE:  The inferior ligament was divided with electrocautery.  While doing this, there was bleeding from the inferior pulmonary vein.  This was controlled with a 4-0 Prolene suture.  The pleural reflection was divided posteriorly at  the hilum.  A level 7 node was identified.  This was removed with a Harmonic scalpel.  Despite that, there was significant bleeding.  All the lymph nodes that were encountered during the dissection had a fibrotic reaction around them and all were very  vascular, but otherwise benign appearing nodes.  All lymph nodes encountered during the dissection were removed and sent as separate specimens for permanent pathology.  The pleural reflection was divided at the hilum anteriorly.  The major fissure  was nearly complete between the lower and middle lobes.  The minor fissure was approximately 2/3 complete.  The right superior pulmonary vein branches were dissected out and encircled.  A separate port incision was made anterior to the first.  This area  was also anesthetized with the bupivacaine solution.  The vascular stapler was passed through this anterior port incision across the upper lobe branches of the superior pulmonary vein, closed and fired.  The middle lobe vein branches were preserved.   Pleural reflection was divided superiorly just inferior to the azygos vein.  There was relatively dense fibrotic tissue superiorly around the pulmonary arterial vessels.  The decision was made to complete the fissure before dissecting out those vessels.   The small bridge of lung between the middle and lower lobes at the confluence of the major and minor fissures was divided with the Echelon stapler.  The posterior ascending branch then was visible.  It was encircled and divided with the vascular stapler.   The remainder of the fissure between the upper  lobe and the superior segment of the lower lobe was completed with the Echelon stapler.  The minor fissure then was completed with sequential firings of the Echelon stapler again using gold cartridges.   There was an adherent lymph node between the anterior and apical pulmonary artery branches and the bronchus.  This was dissected out.  The anterior and apical arterial branches then were encircled and divided with the endoscopic vascular stapler.  The  Echelon stapler with a green cartridge was placed across the right upper lobe bronchus at its origin.  It was closed.  A test inflation showed good aeration of the lower and middle lobes.  The stapler was fired transecting the bronchus.  The right upper  lobe was placed into an endoscopic retrieval bag and removed and sent for frozen section of the bronchial margin.  The right paratracheal area then was inspected for nodes.  There was only one node in that area and it was sent for permanent pathology.  The chest  was copiously irrigated with warm saline.  Test inflation showed a minimal air leakage from the parenchyma.  There was no leak from the bronchial stump.  A 28 French chest tube was placed through the anterior port incision and directed to the apex.  It  was secured to the skin with a #1  silk suture.  The frozen section returned showing the margin was clear.  Dual lung ventilation was resumed.  The thoracoscope was removed.  The posterior port incision was closed with a #1 Vicryl fascial suture followed  by a 3-0 Vicryl subcuticular suture.  The working incision was closed in standard fashion in 3 layers.  Dermabond was applied.  The chest tube was placed to suction.  The patient was placed back in the supine position.  She was extubated in the operating  room and taken to the [facility] in good condition.  TN/NUANCE  D:08/15/2017 T:08/15/2017 JOB:[id number]

## 2017-08-15 NOTE — Anesthesia Preprocedure Evaluation (Signed)
Anesthesia Evaluation  Patient identified by MRN, date of birth, ID band Patient awake    Reviewed: Allergy & Precautions, H&P , NPO status , Patient's Chart, lab work & pertinent test results  Airway Mallampati: II   Neck ROM: full    Dental   Pulmonary COPD, former smoker,    breath sounds clear to auscultation       Cardiovascular hypertension, + Peripheral Vascular Disease and + DOE   Rhythm:regular Rate:Normal     Neuro/Psych  Headaches, PSYCHIATRIC DISORDERS Anxiety Depression Bipolar Disorder  Neuromuscular disease    GI/Hepatic GERD  ,  Endo/Other  diabetes, Type 2  Renal/GU      Musculoskeletal  (+) Arthritis , Fibromyalgia -  Abdominal   Peds  Hematology   Anesthesia Other Findings   Reproductive/Obstetrics                             Anesthesia Physical Anesthesia Plan  ASA: III  Anesthesia Plan: General   Post-op Pain Management:    Induction: Intravenous  PONV Risk Score and Plan: 3 and Ondansetron, Dexamethasone, Midazolam and Treatment may vary due to age or medical condition  Airway Management Planned: Double Lumen EBT  Additional Equipment: Arterial line  Intra-op Plan:   Post-operative Plan: Extubation in OR  Informed Consent: I have reviewed the patients History and Physical, chart, labs and discussed the procedure including the risks, benefits and alternatives for the proposed anesthesia with the patient or authorized representative who has indicated his/her understanding and acceptance.     Plan Discussed with: Anesthesiologist, Surgeon and CRNA  Anesthesia Plan Comments:         Anesthesia Quick Evaluation

## 2017-08-15 NOTE — Brief Op Note (Addendum)
      [address]       [place] [address]             [phone]     08/15/2017  11:47 AM  PATIENT:  Audrey Russell  62 y.o. female  PRE-OPERATIVE DIAGNOSIS:  Right Upper Lobe Nodule  POST-OPERATIVE DIAGNOSIS:  Adenocarcinoma Right upper lobe- Clinical stage IB(T2N0)  PROCEDURE:  Procedure(s): RIGHT VIDEO ASSISTED THORACOSCOPY (VATS)/WEDGE AND LOBECTOMY (Right)  Mediastinal lymph node dissection, Intercostal nerve block  SURGEON:  Surgeon(s) and Role:    * Melrose Nakayama, MD - Primary  PHYSICIAN ASSISTANT: WAYNE GOLD PA-C  ANESTHESIA:   general  EBL:  100 mL   BLOOD ADMINISTERED:none  DRAINS: (1 10 F) Blake drain(s) in the RIGHT HEMITHORAX   LOCAL MEDICATIONS USED:  BUPIVICAINE   SPECIMEN:  Source of Specimen:  RIGHT UPPER LOBE AND LN SAMPLES  DISPOSITION OF SPECIMEN:  PATHOLOGY  COUNTS:  YES  TOURNIQUET:  * No tourniquets in log *  DICTATION: .Other Dictation: Dictation Number [id number]  PLAN OF CARE: Admit to inpatient   PATIENT DISPOSITION: PACU IN STABLE CONDITION   Delay start of Pharmacological VTE agent (>24hrs) due to surgical blood loss or risk of bleeding: yes  COMPLICATIONS: NO KNOWN  FINDINGS: Frozen showed adenocarcinoma. Bronchial margin negative

## 2017-08-15 NOTE — Anesthesia Procedure Notes (Signed)
Arterial Line Insertion Start/End7/26/2019 7:05 AM, 08/15/2017 7:15 AM Performed by: Carney Living, CRNA, CRNA  Patient location: Pre-op. Preanesthetic checklist: patient identified, IV checked, site marked, risks and benefits discussed, surgical consent, monitors and equipment checked and pre-op evaluation Lidocaine 1% used for infiltration and patient sedated Left, radial was placed Catheter size: 20 G Hand hygiene performed  and maximum sterile barriers used   Attempts: 1 Procedure performed without using ultrasound guided technique. Following insertion, dressing applied and Biopatch. Post procedure assessment: normal  Patient tolerated the procedure well with no immediate complications.

## 2017-08-15 NOTE — OR Nursing (Signed)
Dr. Roxan Hockey paged via [facility].

## 2017-08-15 NOTE — Transfer of Care (Signed)
Immediate Anesthesia Transfer of Care Note  Patient: Audrey Russell  Procedure(s) Performed: RIGHT VIDEO ASSISTED THORACOSCOPY (VATS)/WEDGE AND LOBECTOMY (Right Chest)  Patient Location: PACU  Anesthesia Type:General  Level of Consciousness: drowsy and lethargic  Airway & Oxygen Therapy: Patient Spontanous Breathing and Patient connected to face mask oxygen  Post-op Assessment: Report given to RN, Post -op Vital signs reviewed and stable and Patient moving all extremities  Post vital signs: Reviewed and stable  Last Vitals:  Vitals Value Taken Time  BP 143/99 08/15/2017 12:36 PM  Temp    Pulse 93 08/15/2017 12:43 PM  Resp 14 08/15/2017 12:43 PM  SpO2 98 % 08/15/2017 12:43 PM  Vitals shown include unvalidated device data.  Last Pain:  Vitals:   08/15/17 0556  TempSrc:   PainSc: 0-No pain         Complications: No apparent anesthesia complications

## 2017-08-15 NOTE — Interval H&P Note (Signed)
History and Physical Interval Note:  08/15/2017 7:21 AM  Audrey Russell  has presented today for surgery, with the diagnosis of Right Upper Lobe Nodule  The various methods of treatment have been discussed with the patient and family. After consideration of risks, benefits and other options for treatment, the patient has consented to  Procedure(s): RIGHT VIDEO ASSISTED THORACOSCOPY (VATS)/WEDGE RESECTION,POSSIBLE LOBECTOMY (Right) VIDEO ASSISTED THORACOSCOPY (VATS)/ LOBECTOMY (Right) as a surgical intervention .  The patient's history has been reviewed, patient examined, no change in status, stable for surgery.  I have reviewed the patient's chart and labs.  Questions were answered to the patient's satisfaction.     Audrey Russell

## 2017-08-16 ENCOUNTER — Inpatient Hospital Stay (HOSPITAL_COMMUNITY): Payer: PPO

## 2017-08-16 LAB — GLUCOSE, CAPILLARY
GLUCOSE-CAPILLARY: 133 mg/dL — AB (ref 70–99)
Glucose-Capillary: 127 mg/dL — ABNORMAL HIGH (ref 70–99)
Glucose-Capillary: 140 mg/dL — ABNORMAL HIGH (ref 70–99)
Glucose-Capillary: 131 mg/dL — ABNORMAL HIGH (ref 70–99)

## 2017-08-16 LAB — CBC
HEMATOCRIT: 37.6 % (ref 36.0–46.0)
HEMOGLOBIN: 11.8 g/dL — AB (ref 12.0–15.0)
MCH: 27.4 pg (ref 26.0–34.0)
MCHC: 31.4 g/dL (ref 30.0–36.0)
MCV: 87.4 fL (ref 78.0–100.0)
Platelets: 211 10*3/uL (ref 150–400)
RBC: 4.3 MIL/uL (ref 3.87–5.11)
RDW: 13.9 % (ref 11.5–15.5)
WBC: 12.4 10*3/uL — AB (ref 4.0–10.5)

## 2017-08-16 LAB — BASIC METABOLIC PANEL
ANION GAP: 11 (ref 5–15)
BUN: 7 mg/dL — ABNORMAL LOW (ref 8–23)
CALCIUM: 8.7 mg/dL — AB (ref 8.9–10.3)
CHLORIDE: 102 mmol/L (ref 98–111)
CO2: 24 mmol/L (ref 22–32)
CREATININE: 0.75 mg/dL (ref 0.44–1.00)
Glucose, Bld: 120 mg/dL — ABNORMAL HIGH (ref 70–99)
Potassium: 3.7 mmol/L (ref 3.5–5.1)
SODIUM: 137 mmol/L (ref 135–145)

## 2017-08-16 MED ORDER — GLUCERNA SHAKE PO LIQD
237.0000 mL | Freq: Two times a day (BID) | ORAL | Status: DC
  Administered 2017-08-18: 237 mL via ORAL

## 2017-08-16 MED ORDER — INSULIN ASPART 100 UNIT/ML ~~LOC~~ SOLN
0.0000 [IU] | Freq: Three times a day (TID) | SUBCUTANEOUS | Status: DC
  Administered 2017-08-16 – 2017-08-20 (×9): 2 [IU] via SUBCUTANEOUS
  Administered 2017-08-21 (×2): 3 [IU] via SUBCUTANEOUS
  Administered 2017-08-22: 2 [IU] via SUBCUTANEOUS
  Administered 2017-08-22: 3 [IU] via SUBCUTANEOUS
  Administered 2017-08-23: 2 [IU] via SUBCUTANEOUS
  Administered 2017-08-24: 3 [IU] via SUBCUTANEOUS
  Administered 2017-08-26: 2 [IU] via SUBCUTANEOUS

## 2017-08-16 MED ORDER — ADULT MULTIVITAMIN W/MINERALS CH
1.0000 | ORAL_TABLET | Freq: Every day | ORAL | Status: DC
  Administered 2017-08-17 – 2017-08-27 (×11): 1 via ORAL
  Filled 2017-08-16 (×11): qty 1

## 2017-08-16 MED ORDER — ALBUTEROL SULFATE (2.5 MG/3ML) 0.083% IN NEBU
2.5000 mg | INHALATION_SOLUTION | Freq: Three times a day (TID) | RESPIRATORY_TRACT | Status: DC
  Administered 2017-08-16 – 2017-08-17 (×3): 2.5 mg via RESPIRATORY_TRACT
  Filled 2017-08-16 (×3): qty 3

## 2017-08-16 NOTE — Progress Notes (Signed)
Initial Nutrition Assessment  DOCUMENTATION CODES:   Not applicable  INTERVENTION:   -Glucerna Shake po BID, each supplement provides 220 kcal and 10 grams of protein -MVI with minerals daily  NUTRITION DIAGNOSIS:   Increased nutrient needs related to post-op healing as evidenced by estimated needs.  GOAL:   Patient will meet greater than or equal to 90% of their needs  MONITOR:   PO intake, Supplement acceptance, Labs, Weight trends, Skin, I & O's  REASON FOR ASSESSMENT:   Malnutrition Screening Tool    ASSESSMENT:   Audrey Russell is a 62 year old woman with a history of tobacco abuse (1 pack/day x 44 years), COPD, hypertension, hyperlipidemia, multiple sclerosis, arthritis, fibromyalgia, chronic pain, type 2 diabetes without complication, and bipolar disorder.  She developed a persistent cough earlier this year  Pt admitted for cavitary lesion in rt upper lobe, suspicious for malignancy.   7/26- s/p Right video-assisted thoracoscopy, wedge resection, right upper lobe nodule, thoracoscopic right upper lobectomy, mediastinal lymph node dissection, intercostal nerve block.  Spoke with pt at bedside, who is in good spirits today. She estimates she consumed about 75% of her breakfast (eggs, toast, and grits). At baseline, pt reports she loves to eat and does so "whenever I can". She consumes small, frequent meals daily. She reports concern that prior to today, the last time she ate was on 2 days ago in preparation for her surgery.   Pt reports her UBW is around 165#. She shares that she lost about 7# a few months ago, but has since gained it back. She thinks her legs have gotten smaller over the past few months.   Discussed importance of good meal and supplement intake to promote healing. Pt amenable to try supplements.   Last Hgb A1c: 7.4 (08/13/17). PTA DM medications 500 mg metformin BID.   Labs reviewed: CBGS: 127-140 (inpatient orders for glycemic control are 0-24  units insulin aspart every 6 hours and 500 mg metformin BID).   NUTRITION - FOCUSED PHYSICAL EXAM:    Most Recent Value  Orbital Region  No depletion  Upper Arm Region  No depletion  Thoracic and Lumbar Region  No depletion  Buccal Region  No depletion  Clavicle Bone Region  No depletion  Clavicle and Acromion Bone Region  No depletion  Scapular Bone Region  No depletion  Dorsal Hand  No depletion  Patellar Region  No depletion  Anterior Thigh Region  No depletion  Posterior Calf Region  Mild depletion  Edema (RD Assessment)  None  Hair  Reviewed  Eyes  Reviewed  Mouth  Reviewed  Skin  Reviewed  Nails  Reviewed       Diet Order:   Diet Order           Diet Carb Modified Fluid consistency: Thin; Room service appropriate? Yes  Diet effective now          EDUCATION NEEDS:   Education needs have been addressed  Skin:  Skin Assessment: Skin Integrity Issues: Skin Integrity Issues:: Incisions Incisions: closed rt chest  Last BM:  PTA  Height:   Ht Readings from Last 1 Encounters:  08/15/17 5\' 9"  (1.753 m)    Weight:   Wt Readings from Last 1 Encounters:  08/15/17 164 lb 14.4 oz (74.8 kg)    Ideal Body Weight:  65.9 kg  BMI:  Body mass index is 24.35 kg/m.  Estimated Nutritional Needs:   Kcal:  1750-1950  Protein:  85-100 grams  Fluid:  1.7-1.9 L  Juliocesar Blasius A. Jimmye Norman, RD, LDN, CDE Pager: [phone] After hours Pager: [phone]

## 2017-08-16 NOTE — Progress Notes (Signed)
1 Day Post-Op Procedure(s) (LRB): RIGHT VIDEO ASSISTED THORACOSCOPY (VATS)/WEDGE AND LOBECTOMY (Right) Subjective: Requesting removal of Foley catheter Some incisional pain, suing PCA  Objective: Vital signs in last 24 hours: Temp:  [97.9 F (36.6 C)-98.7 F (37.1 C)] 98.6 F (37 C) (07/27 0800) Pulse Rate:  [69-102] 72 (07/27 0800) Cardiac Rhythm: Normal sinus rhythm (07/26 2000) Resp:  [13-27] 19 (07/27 0815) BP: (108-144)/(65-99) 117/71 (07/27 0800) SpO2:  [90 %-99 %] 95 % (07/27 0815) Arterial Line BP: (113-157)/(54-86) 139/61 (07/27 0800)  Hemodynamic parameters for last 24 hours:    Intake/Output from previous day: 07/26 0701 - 07/27 0700 In: 3214.6 [P.O.:120; I.V.:2594.6; IV Piggyback:200] Out: 2499 [Urine:2040; Blood:100; Chest OZYY:482] Intake/Output this shift: No intake/output data recorded.  General appearance: alert, cooperative and no distress Neurologic: intact Heart: regular rate and rhythm Lungs: diminished breath sounds right base Abdomen: normal findings: soft, non-tender small air leak  Lab Results: Recent Labs    08/13/17 1501 08/16/17 0604  WBC 8.1 12.4*  HGB 14.7 11.8*  HCT 46.9* 37.6  PLT 227 211   BMET:  Recent Labs    08/13/17 1501 08/16/17 0604  NA 144 137  K 4.6 3.7  CL 114* 102  CO2 19* 24  GLUCOSE 89 120*  BUN 13 7*  CREATININE 0.77 0.75  CALCIUM 9.9 8.7*    PT/INR:  Recent Labs    08/13/17 1501  LABPROT 12.7  INR 0.96   ABG    Component Value Date/Time   PHART 7.394 08/16/2017 0553   HCO3 25.2 08/16/2017 0553   ACIDBASEDEF 0.6 08/13/2017 1502   O2SAT 97.1 08/16/2017 0553   CBG (last 3)  Recent Labs    08/15/17 2324 08/16/17 0557 08/16/17 0815  GLUCAP 130* 127* 140*    Assessment/Plan: S/P Procedure(s) (LRB): RIGHT VIDEO ASSISTED THORACOSCOPY (VATS)/WEDGE AND LOBECTOMY (Right) Doing well POD # 1  CV- stable, dc A line  RESP- s/p lobectomy  + air leak- keep CT to suction today  Continue IS,  nebs  RENAL- creatinine and lytes OK  IV to KVO  ENDO- CBG mildly elevated  Restart metformin, change SSI to AC/HS  SCD + enoxaparin for DVT prophylaxis  Ambulate   LOS: 1 day    Audrey Russell 08/16/2017

## 2017-08-17 ENCOUNTER — Inpatient Hospital Stay (HOSPITAL_COMMUNITY): Payer: PPO

## 2017-08-17 LAB — GLUCOSE, CAPILLARY
Glucose-Capillary: 128 mg/dL — ABNORMAL HIGH (ref 70–99)
Glucose-Capillary: 131 mg/dL — ABNORMAL HIGH (ref 70–99)
Glucose-Capillary: 148 mg/dL — ABNORMAL HIGH (ref 70–99)
Glucose-Capillary: 121 mg/dL — ABNORMAL HIGH (ref 70–99)

## 2017-08-17 LAB — COMPREHENSIVE METABOLIC PANEL
ALT: 25 U/L (ref 0–44)
AST: 29 U/L (ref 15–41)
Albumin: 3 g/dL — ABNORMAL LOW (ref 3.5–5.0)
Alkaline Phosphatase: 55 U/L (ref 38–126)
Anion gap: 7 (ref 5–15)
BUN: 7 mg/dL — AB (ref 8–23)
CHLORIDE: 107 mmol/L (ref 98–111)
CO2: 26 mmol/L (ref 22–32)
CREATININE: 0.74 mg/dL (ref 0.44–1.00)
Calcium: 8.6 mg/dL — ABNORMAL LOW (ref 8.9–10.3)
GFR calc Af Amer: >60 mL/min (ref >60)
GFR calc non Af Amer: >60 mL/min (ref >60)
GLUCOSE: 127 mg/dL — AB (ref 70–99)
Potassium: 3.4 mmol/L — ABNORMAL LOW (ref 3.5–5.1)
SODIUM: 140 mmol/L (ref 135–145)
Total Bilirubin: 0.9 mg/dL (ref 0.3–1.2)
Total Protein: 5.8 g/dL — ABNORMAL LOW (ref 6.5–8.1)

## 2017-08-17 LAB — CBC
HEMATOCRIT: 35.3 % — AB (ref 36.0–46.0)
HEMOGLOBIN: 10.9 g/dL — AB (ref 12.0–15.0)
MCH: 27.3 pg (ref 26.0–34.0)
MCHC: 30.9 g/dL (ref 30.0–36.0)
MCV: 88.5 fL (ref 78.0–100.0)
PLATELETS: 182 10*3/uL (ref 150–400)
RBC: 3.99 MIL/uL (ref 3.87–5.11)
RDW: 13.9 % (ref 11.5–15.5)
WBC: 8.6 10*3/uL (ref 4.0–10.5)

## 2017-08-17 MED ORDER — POTASSIUM CHLORIDE CRYS ER 20 MEQ PO TBCR
40.0000 meq | EXTENDED_RELEASE_TABLET | Freq: Once | ORAL | Status: AC
  Administered 2017-08-17: 40 meq via ORAL
  Filled 2017-08-17: qty 2

## 2017-08-17 MED ORDER — KETOROLAC TROMETHAMINE 15 MG/ML IJ SOLN
15.0000 mg | Freq: Four times a day (QID) | INTRAMUSCULAR | Status: AC | PRN
  Administered 2017-08-17 – 2017-08-18 (×3): 15 mg via INTRAVENOUS
  Filled 2017-08-17 (×3): qty 1

## 2017-08-17 MED ORDER — ALBUTEROL SULFATE (2.5 MG/3ML) 0.083% IN NEBU
2.5000 mg | INHALATION_SOLUTION | Freq: Two times a day (BID) | RESPIRATORY_TRACT | Status: DC
  Administered 2017-08-17 – 2017-08-21 (×8): 2.5 mg via RESPIRATORY_TRACT
  Filled 2017-08-17 (×8): qty 3

## 2017-08-17 NOTE — Progress Notes (Signed)
[address]       [place] [address]             [phone]      2 Days Post-Op Procedure(s) (LRB): RIGHT VIDEO ASSISTED THORACOSCOPY (VATS)/WEDGE AND LOBECTOMY (Right) Subjective: Generally feels well Using PCA Has large air leak  Objective: Vital signs in last 24 hours: Temp:  [98 F (36.7 C)-98.8 F (37.1 C)] 98.5 F (36.9 C) (07/28 0751) Pulse Rate:  [80-92] 92 (07/28 0751) Cardiac Rhythm: Normal sinus rhythm (07/28 0700) Resp:  [12-23] 16 (07/28 0751) BP: (122-135)/(72-86) 122/86 (07/28 0751) SpO2:  [91 %-98 %] 96 % (07/28 0825) Arterial Line BP: (163)/(75) 163/75 (07/27 1140)  Hemodynamic parameters for last 24 hours:    Intake/Output from previous day: 07/27 0701 - 07/28 0700 In: 960 [P.O.:960] Out: 2020 [Urine:1750; Chest Tube:270] Intake/Output this shift: Total I/O In: 240 [P.O.:240] Out: 0   General appearance: alert, cooperative and no distress Heart: regular rate and rhythm Lungs: coarse BS, some improvement with cough Abdomen: benign Extremities: no edema or calf tenderness Wound: inics healing well  Lab Results: Recent Labs    08/16/17 0604 08/17/17 0426  WBC 12.4* 8.6  HGB 11.8* 10.9*  HCT 37.6 35.3*  PLT 211 182   BMET:  Recent Labs    08/16/17 0604 08/17/17 0426  NA 137 140  K 3.7 3.4*  CL 102 107  CO2 24 26  GLUCOSE 120* 127*  BUN 7* 7*  CREATININE 0.75 0.74  CALCIUM 8.7* 8.6*    PT/INR: No results for input(s): LABPROT, INR in the last 72 hours. ABG    Component Value Date/Time   PHART 7.394 08/16/2017 0553   HCO3 25.2 08/16/2017 0553   ACIDBASEDEF 0.6 08/13/2017 1502   O2SAT 97.1 08/16/2017 0553   CBG (last 3)  Recent Labs    08/16/17 1302 08/16/17 1708 08/17/17 0750  GLUCAP 133* 131* 128*    Meds Scheduled Meds: . acetaminophen  1,000 mg Oral Q6H   Or  . acetaminophen (TYLENOL) oral liquid 160 mg/5 mL  1,000 mg Oral Q6H  . albuterol  2.5 mg Nebulization BID  . bisacodyl  10 mg  Oral Daily  . citalopram  40 mg Oral Daily  . enoxaparin (LOVENOX) injection  40 mg Subcutaneous Daily  . ezetimibe  10 mg Oral QPM  . feeding supplement (GLUCERNA SHAKE)  237 mL Oral BID BM  . fenofibrate  160 mg Oral Daily  . fentaNYL   Intravenous Q4H  . fluticasone  2 spray Each Nare Daily  . gabapentin  300 mg Oral QHS  . insulin aspart  0-15 Units Subcutaneous TID WC  . ketoconazole  1 application Topical BID  . lamoTRIgine  100 mg Oral Daily  . loratadine  10 mg Oral Daily  . mouth rinse  15 mL Mouth Rinse BID  . metFORMIN  500 mg Oral Q breakfast  . mirtazapine  30 mg Oral QHS  . multivitamin with minerals  1 tablet Oral Daily  . pantoprazole  40 mg Oral Daily  . senna-docusate  1 tablet Oral QHS  . sodium chloride flush  10-40 mL Intracatheter Q12H   Continuous Infusions: . sodium chloride 10 mL/hr at 08/16/17 0959  . potassium chloride 10 mEq (08/17/17 0651)   PRN Meds:.diphenhydrAMINE **OR** diphenhydrAMINE, naloxone **AND** sodium chloride flush, ondansetron (ZOFRAN) IV, oxyCODONE, potassium chloride, sodium chloride, sodium chloride flush, traMADol  Xrays Dg Chest Port 1 View  Result Date:  08/17/2017 CLINICAL DATA:  Status post right upper lobectomy EXAM: PORTABLE CHEST 1 VIEW COMPARISON:  Chest radiograph from one day prior. FINDINGS: Internal jugular central venous catheter terminates in the lower third of the SVC. Surgical sutures overlie the parahilar and upper right lung. Stable right upper chest tube. Stable cardiomediastinal silhouette with normal heart size. New small right apical pneumothorax (less than 5%). No left pneumothorax. No pleural effusion. Stable volume loss in the right hemithorax with hazy right upper parahilar opacity. No pulmonary edema. IMPRESSION: 1. Newly visualized small right apical pneumothorax, less than 5%. Right chest tube in place. 2. Otherwise stable postsurgical changes in the right hemithorax with hazy right parahilar lung opacity. These  results were called by telephone at the time of interpretation on 08/17/2017 at 7:44 am to [place], who verbally acknowledged these results. Electronically Signed   By: Ilona Sorrel M.D.   On: 08/17/2017 07:45   Dg Chest Port 1 View  Result Date: 08/16/2017 CLINICAL DATA:  Status post right upper lobectomy EXAM: PORTABLE CHEST 1 VIEW COMPARISON:  08/15/2017 FINDINGS: Cardiac shadow is stable. Right jugular central line and right thoracostomy catheter are again seen and stable. Postsurgical changes in the right upper lobe and right hilum are noted. No discrete pneumothorax is identified at this time. Persistent atelectatic changes are noted. The left lung remains clear. No acute bony abnormality is noted. IMPRESSION: Postsurgical changes on the right stable in appearance from the prior exam. Electronically Signed   By: Inez Catalina M.D.   On: 08/16/2017 07:51   Dg Chest Port 1 View  Result Date: 08/15/2017 CLINICAL DATA:  62 year old female status post right upper lobe resection. EXAM: PORTABLE CHEST 1 VIEW COMPARISON:  Preoperative chest x-ray 08/13/2017 FINDINGS: Surgical changes of right upper lobectomy. A right-sided thoracostomy tube is present. Postoperative changes and likely atelectasis are present in the superior segment of the right lower lobe and also in the right middle lobe which partially opacifies the right cardiac margin. A right IJ central venous catheter is present. The catheter tip projects over the distal SVC. Cardiac and mediastinal contours remain stable. Atherosclerotic calcifications are present in the transverse aorta. Mild vascular congestion but no overt edema. No acute osseous abnormality. IMPRESSION: 1. The tip of the right IJ central venous catheter overlies the mid SVC. 2. Right-sided thoracostomy tube in place without evidence of pneumothorax. 3. Interval surgical changes of right upper lobectomy with persistent atelectasis versus postoperative change in the superior  segment of the right lower lobe and within the right middle lobe. 4.  Aortic Atherosclerosis (ICD10-170.0) Electronically Signed   By: Jacqulynn Cadet M.D.   On: 08/15/2017 13:10    Assessment/Plan: S/P Procedure(s) (LRB): RIGHT VIDEO ASSISTED THORACOSCOPY (VATS)/WEDGE AND LOBECTOMY (Right)  1 overall progressing well, hemodyn stable in sinus rhythm 2 keep CT in place, small pntx /+ air leak 3 cont PCA for now 4 routine pulm toilet- sats are good on RA, cont nebs for now 5 replace K+ 6 renal fxn in normal range 7 H/H pretty stable acute blood loss anemia 8 routine rehab  LOS: 2 days    John Giovanni 08/17/2017 Pager [phone]

## 2017-08-18 ENCOUNTER — Encounter (HOSPITAL_COMMUNITY): Payer: Self-pay | Admitting: Thoracic Surgery (Cardiothoracic Vascular Surgery)

## 2017-08-18 ENCOUNTER — Inpatient Hospital Stay (HOSPITAL_COMMUNITY): Payer: PPO

## 2017-08-18 LAB — GLUCOSE, CAPILLARY
GLUCOSE-CAPILLARY: 130 mg/dL — AB (ref 70–99)
GLUCOSE-CAPILLARY: 142 mg/dL — AB (ref 70–99)
Glucose-Capillary: 126 mg/dL — ABNORMAL HIGH (ref 70–99)
Glucose-Capillary: 112 mg/dL — ABNORMAL HIGH (ref 70–99)

## 2017-08-18 LAB — MYCOBACTERIA,CULT W/FLUOROCHROME SMEAR
MICRO NUMBER:: 90705248
MICRO NUMBER:: 90715658
SMEAR: NONE SEEN
SMEAR:: NONE SEEN
SPECIMEN QUALITY: ADEQUATE
SPECIMEN QUALITY:: ADEQUATE

## 2017-08-18 LAB — BLOOD GAS, ARTERIAL
Acid-Base Excess: 0.9 mmol/L (ref 0.0–2.0)
Bicarbonate: 25.2 mmol/L (ref 20.0–28.0)
DRAWN BY: 347191
O2 Content: 4 L/min
O2 Saturation: 97.1 %
PH ART: 7.394 (ref 7.350–7.450)
Patient temperature: 98.6
pCO2 arterial: 42.1 mmHg (ref 32.0–48.0)
pO2, Arterial: 87.9 mmHg (ref 83.0–108.0)

## 2017-08-18 NOTE — Progress Notes (Signed)
[address]       [place] [address]             [phone]      3 Days Post-Op Procedure(s) (LRB): RIGHT VIDEO ASSISTED THORACOSCOPY (VATS)/WEDGE AND LOBECTOMY (Right) Subjective: C/O some SOB  Objective: Vital signs in last 24 hours: Temp:  [97.9 F (36.6 C)-98.6 F (37 C)] 98.5 F (36.9 C) (07/29 0745) Pulse Rate:  [89-107] 89 (07/29 0745) Cardiac Rhythm: Normal sinus rhythm (07/29 0353) Resp:  [16-26] 23 (07/29 0745) BP: (122-148)/(80-95) 148/95 (07/29 0745) SpO2:  [89 %-100 %] 89 % (07/29 0745)  Hemodynamic parameters for last 24 hours:    Intake/Output from previous day: 07/28 0701 - 07/29 0700 In: 1614.1 [P.O.:480; I.V.:1134.1] Out: 50 [Chest Tube:50] Intake/Output this shift: No intake/output data recorded.  General appearance: alert, cooperative and no distress Heart: regular rate and rhythm Lungs: coarse Abdomen: benign Extremities: no edema or calf tenderness Wound: incis healing well  Lab Results: Recent Labs    08/16/17 0604 08/17/17 0426  WBC 12.4* 8.6  HGB 11.8* 10.9*  HCT 37.6 35.3*  PLT 211 182   BMET:  Recent Labs    08/16/17 0604 08/17/17 0426  NA 137 140  K 3.7 3.4*  CL 102 107  CO2 24 26  GLUCOSE 120* 127*  BUN 7* 7*  CREATININE 0.75 0.74  CALCIUM 8.7* 8.6*    PT/INR: No results for input(s): LABPROT, INR in the last 72 hours. ABG    Component Value Date/Time   PHART 7.394 08/16/2017 0553   HCO3 25.2 08/16/2017 0553   ACIDBASEDEF 0.6 08/13/2017 1502   O2SAT 97.1 08/16/2017 0553   CBG (last 3)  Recent Labs    08/17/17 1227 08/17/17 1643 08/17/17 2116  GLUCAP 131* 148* 121*    Meds Scheduled Meds: . acetaminophen  1,000 mg Oral Q6H   Or  . acetaminophen (TYLENOL) oral liquid 160 mg/5 mL  1,000 mg Oral Q6H  . albuterol  2.5 mg Nebulization BID  . bisacodyl  10 mg Oral Daily  . citalopram  40 mg Oral Daily  . enoxaparin (LOVENOX) injection  40 mg Subcutaneous Daily  . ezetimibe  10  mg Oral QPM  . feeding supplement (GLUCERNA SHAKE)  237 mL Oral BID BM  . fenofibrate  160 mg Oral Daily  . fentaNYL   Intravenous Q4H  . fluticasone  2 spray Each Nare Daily  . gabapentin  300 mg Oral QHS  . insulin aspart  0-15 Units Subcutaneous TID WC  . ketoconazole  1 application Topical BID  . lamoTRIgine  100 mg Oral Daily  . loratadine  10 mg Oral Daily  . mouth rinse  15 mL Mouth Rinse BID  . metFORMIN  500 mg Oral Q breakfast  . mirtazapine  30 mg Oral QHS  . multivitamin with minerals  1 tablet Oral Daily  . pantoprazole  40 mg Oral Daily  . senna-docusate  1 tablet Oral QHS  . sodium chloride flush  10-40 mL Intracatheter Q12H   Continuous Infusions: . sodium chloride 10 mL/hr at 08/17/17 2300   PRN Meds:.diphenhydrAMINE **OR** diphenhydrAMINE, ketorolac, naloxone **AND** sodium chloride flush, ondansetron (ZOFRAN) IV, oxyCODONE, sodium chloride, sodium chloride flush, traMADol  Xrays Dg Chest Port 1 View  Result Date: 08/17/2017 CLINICAL DATA:  Status post right upper lobectomy EXAM: PORTABLE CHEST 1 VIEW COMPARISON:  Chest radiograph from one day prior. FINDINGS: Internal jugular central venous catheter terminates in the  lower third of the SVC. Surgical sutures overlie the parahilar and upper right lung. Stable right upper chest tube. Stable cardiomediastinal silhouette with normal heart size. New small right apical pneumothorax (less than 5%). No left pneumothorax. No pleural effusion. Stable volume loss in the right hemithorax with hazy right upper parahilar opacity. No pulmonary edema. IMPRESSION: 1. Newly visualized small right apical pneumothorax, less than 5%. Right chest tube in place. 2. Otherwise stable postsurgical changes in the right hemithorax with hazy right parahilar lung opacity. These results were called by telephone at the time of interpretation on 08/17/2017 at 7:44 am to [place], who verbally acknowledged these results. Electronically Signed   By:  Ilona Sorrel M.D.   On: 08/17/2017 07:45    Assessment/Plan: S/P Procedure(s) (LRB): RIGHT VIDEO ASSISTED THORACOSCOPY (VATS)/WEDGE AND LOBECTOMY (Right)   1 hemodyn stable with some HTN readings, some PVC's- not on home htn meds- follow 2 O2 sats low while in room- replaced O2 3 large air leak, off suction, not sure why- replaced at 20 cm H2O 4 check CXR 5 BS adeq control 6 no new labs 7 cont pulm toilet/rehab  LOS: 3 days    John Giovanni 08/18/2017

## 2017-08-18 NOTE — Consult Note (Signed)
[facility] CM Primary Care Navigator  08/18/2017  Andy Moye 12-Jul-1955 [id number]   Met with patientat the bedsideto identify possible discharge needs. Patientreports thatshe had persistent coughing that warranted for CT scan that showed right upper lobe nodule which resulted to this admission/ surgery. (status post lung lobectomy)  PatientendorsesDr.[name] [name] with Therapist, music at [place] care provider.   [place] to [employer].  Patient reportsmanagingher ownmedications at home straight out of the containers.  Patientstatesthatdaughter (Deirdre) has beendriving and Radio producer' appointments.   Patient reports livingat home with daughter who will be her primary caregiver at home.  Anticipated discharge plan ishome to her daughter's house where she has been staying, per patient.  Patientvoiced understanding to call primary care provider'soffice whenshe returns home for a post discharge follow-up appointment within1- 2 weeksor sooner if needs arise.Patient letter (with PCP's contact number) was provided asareminder.  Explained topatient regardingTHN CM services available for health management and resourcesat home but she indicated being capable of managing her health issues with daughter's assistance. Patient haddeclinedTHN-CM servicesoffered which includes EMMIcalls to follow-up with her recovery. She plans to discuss with primary care provider on her next visit to find out further needs to manage DM.   Patienthowever,expressed understanding to seekreferral to [facility] care managementfrom primary care providerifdeemed necessary and appropriate for any servicesin thefuture.   [facility] care management information provided for future needs that may arise.   For additional questions please  contact:  Edwena Felty A. Keeghan Mcintire, BSN, RN-BC [facility] PRIMARY CARE Navigator Cell: [phone]

## 2017-08-18 NOTE — Anesthesia Postprocedure Evaluation (Signed)
Anesthesia Post Note  Patient: Audrey Russell  Procedure(s) Performed: RIGHT VIDEO ASSISTED THORACOSCOPY (VATS)/WEDGE AND LOBECTOMY (Right Chest)     Patient location during evaluation: PACU Anesthesia Type: General Level of consciousness: awake and alert Pain management: pain level controlled Vital Signs Assessment: post-procedure vital signs reviewed and stable Respiratory status: spontaneous breathing, nonlabored ventilation, respiratory function stable and patient connected to nasal cannula oxygen Cardiovascular status: blood pressure returned to baseline and stable Postop Assessment: no apparent nausea or vomiting Anesthetic complications: no    Last Vitals:  Vitals:   08/18/17 0745 08/18/17 0845  BP: (!) 148/95   Pulse: 89   Resp: (!) 23   Temp: 36.9 C   SpO2: (!) 89% 95%    Last Pain:  Vitals:   08/18/17 0745  TempSrc: Oral  PainSc:                  [place] S

## 2017-08-19 ENCOUNTER — Inpatient Hospital Stay (HOSPITAL_COMMUNITY): Payer: PPO

## 2017-08-19 LAB — GLUCOSE, CAPILLARY
GLUCOSE-CAPILLARY: 118 mg/dL — AB (ref 70–99)
Glucose-Capillary: 114 mg/dL — ABNORMAL HIGH (ref 70–99)
Glucose-Capillary: 122 mg/dL — ABNORMAL HIGH (ref 70–99)
Glucose-Capillary: 116 mg/dL — ABNORMAL HIGH (ref 70–99)

## 2017-08-19 NOTE — Progress Notes (Addendum)
      [address]       [place] [address]             [phone]      4 Days Post-Op Procedure(s) (LRB): RIGHT VIDEO ASSISTED THORACOSCOPY (VATS)/WEDGE AND LOBECTOMY (Right)   Subjective:   Has pain along right upper chest with deep breaths.  Otherwise is doing well.  + BM  Objective: Vital signs in last 24 hours: Temp:  [97.7 F (36.5 C)-98.7 F (37.1 C)] 98.1 F (36.7 C) (07/30 0718) Pulse Rate:  [75-91] 86 (07/30 0805) Cardiac Rhythm: Normal sinus rhythm (07/29 2140) Resp:  [10-29] 19 (07/30 0805) BP: (129-151)/(75-94) 134/87 (07/30 0805) SpO2:  [88 %-96 %] 95 % (07/30 0805) FiO2 (%):  [28 %] 28 % (07/30 0805)  Intake/Output from previous day: 07/29 0701 - 07/30 0700 In: 481.7 [P.O.:360; I.V.:111.7] Out: 100 [Chest Tube:100]  General appearance: alert, cooperative and no distress Heart: regular rate and rhythm Lungs: clear to auscultation bilaterally Abdomen: soft, non-tender; bowel sounds normal; no masses,  no organomegaly Extremities: extremities normal, atraumatic, no cyanosis or edema Wound: clean and dry  Lab Results: Recent Labs    08/17/17 0426  WBC 8.6  HGB 10.9*  HCT 35.3*  PLT 182   BMET:  Recent Labs    08/17/17 0426  NA 140  K 3.4*  CL 107  CO2 26  GLUCOSE 127*  BUN 7*  CREATININE 0.74  CALCIUM 8.6*    PT/INR: No results for input(s): LABPROT, INR in the last 72 hours. ABG    Component Value Date/Time   PHART 7.394 08/16/2017 0553   HCO3 25.2 08/16/2017 0553   ACIDBASEDEF 0.6 08/13/2017 1502   O2SAT 97.1 08/16/2017 0553   CBG (last 3)  Recent Labs    08/18/17 1655 08/18/17 2136 08/19/17 0804  GLUCAP 130* 142* 114*    Assessment/Plan: S/P Procedure(s) (LRB): RIGHT VIDEO ASSISTED THORACOSCOPY (VATS)/WEDGE AND LOBECTOMY (Right)  1. Chest tube- on suction, no air leak appreciated this morning, new dressing applied- will defer to Dr. Roxan Hockey on possibly transferring back to water seal vs leaving on  suction for another 24 hours 2. Pulm- no acute issues, CXR shows stable appearance of sub q emphysema, no significant pneumothorax identified 3. CV- NSR, Hypertensive at times, not on medications prior to admission, will need to follow up with PCP 4. DM- sugars controlled, continue current regimen, SSIP 5. Lovenox for DVT prophylaxis 6. Dispo- patient stable, pain in right upper chest with deep breathing, not unexpected, no air leak present today on suction, possibly move to water seal today, repeat CXR in AM   LOS: 4 days    Ellwood Handler 08/19/2017 Patient seen and examined, agree with above No air leak with repeated coughs- CT to water seal  Remo Lipps C. Roxan Hockey, MD [facility] [phone]

## 2017-08-20 ENCOUNTER — Inpatient Hospital Stay (HOSPITAL_COMMUNITY): Payer: PPO

## 2017-08-20 LAB — GLUCOSE, CAPILLARY
GLUCOSE-CAPILLARY: 129 mg/dL — AB (ref 70–99)
Glucose-Capillary: 124 mg/dL — ABNORMAL HIGH (ref 70–99)
Glucose-Capillary: 102 mg/dL — ABNORMAL HIGH (ref 70–99)
Glucose-Capillary: 145 mg/dL — ABNORMAL HIGH (ref 70–99)

## 2017-08-20 MED ORDER — GUAIFENESIN ER 600 MG PO TB12
1200.0000 mg | ORAL_TABLET | Freq: Two times a day (BID) | ORAL | Status: AC
  Administered 2017-08-20 – 2017-08-22 (×5): 1200 mg via ORAL
  Filled 2017-08-20 (×5): qty 2

## 2017-08-20 NOTE — Progress Notes (Signed)
Nutrition Follow-up  DOCUMENTATION CODES:   Not applicable  INTERVENTION:   -D/c Glucerna Shake due to poor acceptance -Continue MVI with minerals daily -Snacks TID  NUTRITION DIAGNOSIS:   Increased nutrient needs related to post-op healing as evidenced by estimated needs.  Ongoing  GOAL:   Patient will meet greater than or equal to 90% of their needs  Progressing  MONITOR:   PO intake, Supplement acceptance, Labs, Weight trends, Skin, I & O's  REASON FOR ASSESSMENT:   Malnutrition Screening Tool    ASSESSMENT:   Audrey Russell is a 62 year old woman with a history of tobacco abuse (1 pack/day x 44 years), COPD, hypertension, hyperlipidemia, multiple sclerosis, arthritis, fibromyalgia, chronic pain, type 2 diabetes without complication, and bipolar disorder.  She developed a persistent cough earlier this year  7/26- s/p Right video-assisted thoracoscopy, wedge resection, right upper lobe nodule, thoracoscopic right upper lobectomy, mediastinal lymph node dissection, intercostal nerve block. 7/29- CT revealed small PTX and airleak 7/30- transitioned chest tube to water seal  Reviewed I/O's: +30 ml chest tube output x 24 hours. Net +690 ml x 24 hours and +2.3 L since admission  Attempted to speak with pt x 3, however, pt either receiving nursing care or taking bath at time of visit.   Meal completion has been variable; PO 30-100%. Pt has been refusing Glucerna shakes. Will trial in between meal nourishments to better mimic pt's usual meal pattern.   Labs reviewed: CBGS: 017-510 (inpatient orders for glycemic control are 0-15 units isnulin aspart TID with meals).   Diet Order:   Diet Order           Diet Carb Modified Fluid consistency: Thin; Room service appropriate? Yes  Diet effective now          EDUCATION NEEDS:   Education needs have been addressed  Skin:  Skin Assessment: Skin Integrity Issues: Skin Integrity Issues:: Incisions Incisions: closed  rt chest incision, rt chest tube  Last BM:  08/18/17  Height:   Ht Readings from Last 1 Encounters:  08/15/17 5\' 9"  (1.753 m)    Weight:   Wt Readings from Last 1 Encounters:  08/20/17 175 lb 7.8 oz (79.6 kg)    Ideal Body Weight:  65.9 kg  BMI:  Body mass index is 25.91 kg/m.  Estimated Nutritional Needs:   Kcal:  2585-2778  Protein:  85-100 grams  Fluid:  1.7-1.9 L    Alechia Lezama A. Jimmye Norman, RD, LDN, CDE Pager: [phone] After hours Pager: [phone]

## 2017-08-20 NOTE — Progress Notes (Signed)
Notified MD of xray results post chest tube removal. Xray showed slight increase in right pneumo. No orders received. Will continue to monitor.

## 2017-08-20 NOTE — Plan of Care (Signed)
  Problem: Education: Goal: Knowledge of General Education information will improve Description Including pain rating scale, medication(s)/side effects and non-pharmacologic comfort measures Outcome: Progressing   Problem: Education: Goal: Knowledge of disease or condition will improve Outcome: Progressing Goal: Knowledge of the prescribed therapeutic regimen will improve Outcome: Progressing   Problem: Activity: Goal: Risk for activity intolerance will decrease Outcome: Progressing   Problem: Cardiac: Goal: Will achieve and/or maintain hemodynamic stability Outcome: Progressing   Problem: Clinical Measurements: Goal: Postoperative complications will be avoided or minimized Outcome: Progressing   Problem: Respiratory: Goal: Respiratory status will improve Outcome: Progressing   Problem: Pain Management: Goal: Pain level will decrease Outcome: Progressing   Problem: Skin Integrity: Goal: Wound healing without signs and symptoms infection will improve Outcome: Progressing

## 2017-08-20 NOTE — Progress Notes (Addendum)
      [address]       [place] [address]             [phone]      5 Days Post-Op Procedure(s) (LRB): RIGHT VIDEO ASSISTED THORACOSCOPY (VATS)/WEDGE AND LOBECTOMY (Right)   Subjective:  Patient complains of pain along right upper chest.  She is more congested today than yesterday, not coughing much up.  Objective: Vital signs in last 24 hours: Temp:  [97.7 F (36.5 C)-98.8 F (37.1 C)] 98.1 F (36.7 C) (07/31 0756) Pulse Rate:  [76-96] 85 (07/31 0756) Cardiac Rhythm: Normal sinus rhythm (07/31 0709) Resp:  [12-26] 14 (07/31 0756) BP: (135-154)/(79-84) 140/82 (07/31 0756) SpO2:  [92 %-97 %] 94 % (07/31 0756) FiO2 (%):  [4 %-28 %] 4 % (07/31 0756) Weight:  [175 lb 7.8 oz (79.6 kg)] 175 lb 7.8 oz (79.6 kg) (07/31 0756)  Intake/Output from previous day: 07/30 0701 - 07/31 0700 In: 720 [P.O.:720] Out: 30 [Chest Tube:30]  General appearance: alert, cooperative and no distress Heart: regular rate and rhythm Lungs: coarse bilaterally Abdomen: soft, non-tender; bowel sounds normal; no masses,  no organomegaly Extremities: extremities normal, atraumatic, no cyanosis or edema Wound: clean and dry  Lab Results: No results for input(s): WBC, HGB, HCT, PLT in the last 72 hours. BMET: No results for input(s): NA, K, CL, CO2, GLUCOSE, BUN, CREATININE, CALCIUM in the last 72 hours.  PT/INR: No results for input(s): LABPROT, INR in the last 72 hours. ABG    Component Value Date/Time   PHART 7.394 08/16/2017 0553   HCO3 25.2 08/16/2017 0553   ACIDBASEDEF 0.6 08/13/2017 1502   O2SAT 97.1 08/16/2017 0553   CBG (last 3)  Recent Labs    08/19/17 1730 08/19/17 2113 08/20/17 0759  GLUCAP 118* 116* 124*    Assessment/Plan: S/P Procedure(s) (LRB): RIGHT VIDEO ASSISTED THORACOSCOPY (VATS)/WEDGE AND LOBECTOMY (Right)  1. Chest tube- + air leak with cough- leave on water seal today 2. Pulm-  sub q air appears stable, no pneumothorax identified, worsening  airspace disease on right  3. CV- NSR, HTN stable 4. DM- sugars controlled 5. Dispo- patient with continued pain in right upper chest, + air leak with cough, chest tube on water seal,  continue current care   LOS: 5 days    Ellwood Handler 08/20/2017 She had a lot of tidal movement but I do not see any air leak with repeated strong coughs. Will dc chest tube Add mucinex for congestion as she has some RML atelectasis  Remo Lipps C. Roxan Hockey, MD [facility] [phone]

## 2017-08-20 NOTE — Progress Notes (Signed)
Feels better with CT out  Just ambulated a short distance  BP (!) 155/79   Pulse 90   Temp 98.5 F (36.9 C) (Oral)   Resp 14   Ht 5\' 9"  (1.753 m)   Wt 175 lb 7.8 oz (79.6 kg)   SpO2 96%   BMI 25.91 kg/m   Reviewed CXR. The right middle lobe atelectasis is improved. She does have an apical space. Difficult to tell true size due to SQ emphysema  Remo Lipps C. Roxan Hockey, MD [facility] [phone]

## 2017-08-20 NOTE — Progress Notes (Signed)
87mL IV Fentanyl from PCA wasted.  Witnessed by Van Clines, RN.

## 2017-08-21 ENCOUNTER — Inpatient Hospital Stay (HOSPITAL_COMMUNITY): Payer: PPO

## 2017-08-21 ENCOUNTER — Other Ambulatory Visit: Payer: Self-pay | Admitting: *Deleted

## 2017-08-21 LAB — CBC WITH DIFFERENTIAL/PLATELET
Abs Immature Granulocytes: 0.1 10*3/uL (ref 0.0–0.1)
BASOS PCT: 0 %
Basophils Absolute: 0 10*3/uL (ref 0.0–0.1)
Eosinophils Absolute: 0.1 10*3/uL (ref 0.0–0.7)
Eosinophils Relative: 1 %
HEMATOCRIT: 36.2 % (ref 36.0–46.0)
Hemoglobin: 11.5 g/dL — ABNORMAL LOW (ref 12.0–15.0)
Immature Granulocytes: 0 %
Lymphocytes Relative: 17 %
Lymphs Abs: 2.1 10*3/uL (ref 0.7–4.0)
MCH: 27.5 pg (ref 26.0–34.0)
MCHC: 31.8 g/dL (ref 30.0–36.0)
MCV: 86.6 fL (ref 78.0–100.0)
MONOS PCT: 5 %
Monocytes Absolute: 0.6 10*3/uL (ref 0.1–1.0)
Neutro Abs: 9.6 10*3/uL — ABNORMAL HIGH (ref 1.7–7.7)
Neutrophils Relative %: 77 %
PLATELETS: 258 10*3/uL (ref 150–400)
RBC: 4.18 MIL/uL (ref 3.87–5.11)
RDW: 13.8 % (ref 11.5–15.5)
WBC: 12.4 10*3/uL — AB (ref 4.0–10.5)

## 2017-08-21 LAB — EXPECTORATED SPUTUM ASSESSMENT W GRAM STAIN, RFLX TO RESP C

## 2017-08-21 LAB — GLUCOSE, CAPILLARY
GLUCOSE-CAPILLARY: 132 mg/dL — AB (ref 70–99)
Glucose-Capillary: 152 mg/dL — ABNORMAL HIGH (ref 70–99)
Glucose-Capillary: 104 mg/dL — ABNORMAL HIGH (ref 70–99)
Glucose-Capillary: 166 mg/dL — ABNORMAL HIGH (ref 70–99)

## 2017-08-21 LAB — EXPECTORATED SPUTUM ASSESSMENT W REFEX TO RESP CULTURE

## 2017-08-21 MED ORDER — MORPHINE SULFATE (PF) 2 MG/ML IV SOLN
2.0000 mg | INTRAVENOUS | Status: DC | PRN
  Administered 2017-08-21 – 2017-08-23 (×2): 2 mg via INTRAVENOUS
  Filled 2017-08-21 (×2): qty 1

## 2017-08-21 MED ORDER — MORPHINE SULFATE (PF) 4 MG/ML IV SOLN
4.0000 mg | Freq: Once | INTRAVENOUS | Status: AC
  Administered 2017-08-21: 4 mg via INTRAVENOUS
  Filled 2017-08-21: qty 1

## 2017-08-21 MED ORDER — AMOXICILLIN-POT CLAVULANATE 875-125 MG PO TABS
1.0000 | ORAL_TABLET | Freq: Two times a day (BID) | ORAL | Status: AC
  Administered 2017-08-21 – 2017-08-25 (×10): 1 via ORAL
  Filled 2017-08-21 (×10): qty 1

## 2017-08-21 MED ORDER — LEVALBUTEROL HCL 0.63 MG/3ML IN NEBU: 0.6300 mg | INHALATION_SOLUTION | Freq: Three times a day (TID) | RESPIRATORY_TRACT | Status: DC

## 2017-08-21 MED ORDER — LIDOCAINE HCL (PF) 1 % IJ SOLN
INTRAMUSCULAR | Status: AC
  Administered 2017-08-21: 30 mL
  Filled 2017-08-21: qty 30

## 2017-08-21 MED ORDER — LIDOCAINE HCL (PF) 1 % IJ SOLN
0.0000 mL | Freq: Once | INTRAMUSCULAR | Status: DC | PRN
Start: 2017-08-21 — End: 2017-08-27

## 2017-08-21 MED ORDER — LEVALBUTEROL HCL 0.63 MG/3ML IN NEBU: 0.6300 mg | INHALATION_SOLUTION | Freq: Three times a day (TID) | RESPIRATORY_TRACT | Status: DC | PRN

## 2017-08-21 NOTE — Care Management Note (Signed)
Case Management Note  Patient Details  Name: Audrey Russell MRN: [id number] Date of Birth: 08/24/1955  Subjective/Objective:   Pt is s/p VATS                 Action/Plan:  PTA independent from home.  CM will continue to follow for discharge needs   Expected Discharge Date:                  Expected Discharge Plan:  Home/Self Care(PTA Independent from home, pt has PCP and denied barriers with paying for medications)  In-House Referral:     Discharge planning Services  CM Consult  Post Acute Care Choice:    Choice offered to:     DME Arranged:    DME Agency:     HH Arranged:    HH Agency:     Status of Service:     If discussed at [employer], dates discussed:    Additional Comments:  Maryclare Labrador, RN 08/21/2017, 4:07 PM

## 2017-08-21 NOTE — Procedures (Signed)
Informed consent obtained Morphine 4 mg IV Sterile technique 20 ml of 1% lidocaine local anesthesia 50F pigtail catheter placed right pleural space using Modified Seldinger technique Tolerated well CT to pleuravac  Steven C. Roxan Hockey, MD [facility] [phone]

## 2017-08-21 NOTE — Progress Notes (Signed)
Result of chest- xray relayed to PA with no order.

## 2017-08-21 NOTE — Progress Notes (Signed)
Ambulated along the hallway for 5 min, tolerated well.

## 2017-08-21 NOTE — Care Management Important Message (Signed)
Important Message  Patient Details  Name: Audrey Russell MRN: [id number] Date of Birth: 01-24-1955   Medicare Important Message Given:  Yes    Barb Merino Sayre Mazor 08/21/2017, 3:40 PM

## 2017-08-21 NOTE — Progress Notes (Signed)
Fentanyl pca 12 ml wasted in the sink as witnessed by RN.

## 2017-08-21 NOTE — Progress Notes (Addendum)
[place] [address]       [place] [address]             [phone]      6 Days Post-Op Procedure(s) (LRB): RIGHT VIDEO ASSISTED THORACOSCOPY (VATS)/WEDGE AND LOBECTOMY (Right) Subjective: Had some chills, sweats, + low grade temp, + sputum production  Objective: Vital signs in last 24 hours: Temp:  [98.5 F (36.9 C)-100.4 F (38 C)] 100.1 F (37.8 C) (08/01 0352) Pulse Rate:  [88-122] 110 (08/01 0720) Cardiac Rhythm: Sinus tachycardia (08/01 0720) Resp:  [12-24] 14 (08/01 0754) BP: (112-155)/(66-86) 116/71 (08/01 0720) SpO2:  [91 %-96 %] 93 % (08/01 0754)  Hemodynamic parameters for last 24 hours:    Intake/Output from previous day: 07/31 0701 - 08/01 0700 In: 120 [P.O.:110; I.V.:10] Out: 150 [Chest Tube:150] Intake/Output this shift: No intake/output data recorded.  General appearance: alert, cooperative and no distress Heart: regular rate and rhythm Lungs: dim on right mildly throughout Abdomen: benign Extremities: no eema or calf tenderness Wound: incis healing well  Lab Results: No results for input(s): WBC, HGB, HCT, PLT in the last 72 hours. BMET: No results for input(s): NA, K, CL, CO2, GLUCOSE, BUN, CREATININE, CALCIUM in the last 72 hours.  PT/INR: No results for input(s): LABPROT, INR in the last 72 hours. ABG    Component Value Date/Time   PHART 7.394 08/16/2017 0553   HCO3 25.2 08/16/2017 0553   ACIDBASEDEF 0.6 08/13/2017 1502   O2SAT 97.1 08/16/2017 0553   CBG (last 3)  Recent Labs    08/20/17 1209 08/20/17 1703 08/20/17 2124  GLUCAP 102* 129* 145*    Meds Scheduled Meds: . albuterol  2.5 mg Nebulization BID  . bisacodyl  10 mg Oral Daily  . citalopram  40 mg Oral Daily  . enoxaparin (LOVENOX) injection  40 mg Subcutaneous Daily  . ezetimibe  10 mg Oral QPM  . fenofibrate  160 mg Oral Daily  . fentaNYL   Intravenous Q4H  . fluticasone  2 spray Each Nare Daily  . gabapentin  300 mg Oral QHS  . guaiFENesin  1,200  mg Oral BID  . insulin aspart  0-15 Units Subcutaneous TID WC  . ketoconazole  1 application Topical BID  . lamoTRIgine  100 mg Oral Daily  . loratadine  10 mg Oral Daily  . mouth rinse  15 mL Mouth Rinse BID  . metFORMIN  500 mg Oral Q breakfast  . mirtazapine  30 mg Oral QHS  . multivitamin with minerals  1 tablet Oral Daily  . pantoprazole  40 mg Oral Daily  . senna-docusate  1 tablet Oral QHS  . sodium chloride flush  10-40 mL Intracatheter Q12H   Continuous Infusions: . sodium chloride 10 mL/hr at 08/20/17 0800   PRN Meds:.diphenhydrAMINE **OR** diphenhydrAMINE, naloxone **AND** sodium chloride flush, ondansetron (ZOFRAN) IV, oxyCODONE, sodium chloride, sodium chloride flush, traMADol  Xrays Dg Chest 1v Repeat Same Day  Result Date: 08/20/2017 CLINICAL DATA:  Status post chest tube removal on the right EXAM: CHEST - 1 VIEW SAME DAY COMPARISON:  08/20/2017 FINDINGS: Right jugular central line is again identified. Right chest tube has been removed in the interval. a some slight enlargement in the right pneumothorax is noted. Increase in subcutaneous emphysema is noted. The left lung is clear. IMPRESSION: Slight increase in right-sided pneumothorax following chest tube removal. Improved aeration is noted within the right lung however when compared with the prior exam. These results will be called  to the ordering clinician or representative by the Radiologist Assistant, and communication documented in the PACS or zVision Dashboard. Electronically Signed   By: Inez Catalina M.D.   On: 08/20/2017 13:27   Dg Chest Port 1 View  Result Date: 08/20/2017 CLINICAL DATA:  Chest tube, post lobectomy EXAM: PORTABLE CHEST 1 VIEW COMPARISON:  08/19/2017 FINDINGS: Right lateral chest tube remains in place. Extensive subcutaneous emphysema and mild right apical pneumothorax again noted, unchanged. Postoperative changes on the right. Increasing airspace disease throughout the right lung. Left lung is clear.  Mild cardiomegaly. IMPRESSION: Postoperative changes on the right with stable right apical pneumothorax and subcutaneous emphysema. Increasing right lung airspace disease. Cardiomegaly. Electronically Signed   By: Rolm Baptise M.D.   On: 08/20/2017 08:32    Assessment/Plan: S/P Procedure(s) (LRB): RIGHT VIDEO ASSISTED THORACOSCOPY (VATS)/WEDGE AND LOBECTOMY (Right)  1 Stable but some concerns for brochitis/pneumonia + sputum production(brownish) with low grade temp, chills, diaphoresis with sinus tachy- will send sputum CX, cont nebs but change to xopenex. Add flutter valve. May need to consider ABX  2 Check CBC with diff 3 CXR is pending 4 sats ok on 1-2 liters O2 5 sugars adeq control    LOS: 6 days    Audrey Russell 08/21/2017 Pager [phone]  Patient seen and examined, agree with above She had some atelectasis, mucous plugging noted on CXR yesterday which probably accounts for temp, will treat empirically for bronchitis, she does not have pneumonia Change nebs to PRN Continue to wean O2 DC PCA PATH= T1N0 adenocarcinoma  Remo Lipps C. Roxan Hockey, MD [facility] [phone]

## 2017-08-22 ENCOUNTER — Inpatient Hospital Stay (HOSPITAL_COMMUNITY): Payer: PPO

## 2017-08-22 LAB — GLUCOSE, CAPILLARY
GLUCOSE-CAPILLARY: 130 mg/dL — AB (ref 70–99)
Glucose-Capillary: 117 mg/dL — ABNORMAL HIGH (ref 70–99)
Glucose-Capillary: 187 mg/dL — ABNORMAL HIGH (ref 70–99)
Glucose-Capillary: 126 mg/dL — ABNORMAL HIGH (ref 70–99)

## 2017-08-22 NOTE — Discharge Instructions (Signed)
Lung Cancer Lung cancer occurs when abnormal cells in the lung grow out of control and form a mass (tumor). There are several types of lung cancer. The two most common types are:  Non-small cell. In this type of lung cancer, abnormal cells are larger and grow more slowly than those of small cell lung cancer.  Small cell. In this type of lung cancer, abnormal cells are smaller than those of non-small cell lung cancer. Small cell lung cancer gets worse faster than non-small cell lung cancer.  What are the causes? The leading cause of lung cancer is smoking tobacco. The second leading cause is radon exposure. What increases the risk?  Smoking tobacco.  Exposure to secondhand tobacco smoke.  Exposure to radon gas.  Exposure to asbestos.  Exposure to arsenic in drinking water.  Air pollution.  Family or personal history of lung cancer.  Lung radiation therapy.  Being older than 62 years. What are the signs or symptoms? In the early stages, symptoms may not be present. As the cancer progresses, symptoms may include:  A lasting cough, possibly with blood.  Fatigue.  Unexplained weight loss.  Shortness of breath.  Wheezing.  Chest pain.  Loss of appetite.  Symptoms of advanced lung cancer include:  Hoarseness.  Bone or joint pain.  Weakness.  Nail problems.  Face or arm swelling.  Paralysis of the face.  Drooping eyelids.  How is this diagnosed? Lung cancer can be identified with a physical exam and with tests such as:  A chest X-ray.  A CT scan.  Blood tests.  A biopsy.  After a diagnosis is made, you will have more tests to determine the stage of the cancer. The stages of non-small cell lung cancer are:  Stage 0, also called carcinoma in situ. At this stage, abnormal cells are found in the inner lining of your lung or lungs.  Stage I. At this stage, abnormal cells have grown into a tumor that is no larger than 5 cm across. The cancer has entered  the deeper lung tissue but has not yet entered the lymph nodes or other parts of the body.  Stage II. At this stage, the tumor is 7 cm across or smaller and has entered nearby lymph nodes. Or, the tumor is 5 cm across or smaller and has invaded surrounding tissue but is not found in nearby lymph nodes. There may be more than one tumor present.  Stage III. At this stage, the tumor may be any size. There may be more than one tumor in the lungs. The cancer cells have spread to the lymph nodes and possibly to other organs.  Stage IV. At this stage, there are tumors in both lungs and the cancer has spread to other areas of the body.  The stages of small cell lung cancer are:  Limited. At this stage, the cancer is found only on one side of the chest.  Extensive. At this stage, the cancer is in the lungs and in tissues on the other side of the chest. The cancer has spread to other organs or is found in the fluid between the layers of your lungs.  How is this treated? Depending on the type and stage of your lung cancer, you may be treated with:  Surgery. This is done to remove a tumor.  Radiation therapy. This treatment destroys cancer cells using X-rays or other types of radiation.  Chemotherapy. This treatment uses medicines to destroy cancer cells.  Targeted therapy. This treatment  aims to destroy only cancer cells instead of all cells as other therapies do.  You may also have a combination of treatments. Follow these instructions at home:  Do not use any tobacco products. This includes cigarettes, chewing tobacco, and electronic cigarettes. If you need help quitting, ask your health care provider.  Take medicines only as directed by your health care provider.  Eat a healthy diet. Work with a dietitian to make sure you are getting the nutrition you need.  Consider joining a support group or seeking counseling to help you cope with the stress of having lung cancer.  Let your cancer  specialist (oncologist) know if you are admitted to the hospital.  Keep all follow-up visits as directed by your health care provider. This is important. Contact a health care provider if:  You lose weight without trying.  You have a persistent cough and wheezing.  You feel short of breath.  You tire easily.  You experience bone or joint pain.  You have difficulty swallowing.  You feel hoarse or notice your voice changing.  Your pain medicine is not helping. Get help right away if:  You cough up blood.  You have new breathing problems.  You develop chest pain.  You develop swelling in: ? One or both ankles or legs. ? Your face, neck, or arms.  You are confused.  You experience paralysis in your face or a drooping eyelid. This information is not intended to replace advice given to you by your health care provider. Make sure you discuss any questions you have with your health care provider. Document Released: 04/15/2000 Document Revised: 06/15/2015 Document Reviewed: 05/13/2013 Elsevier Interactive Patient Education  2018 [employer]. Thoracoscopy, Care After Refer to this sheet in the next few weeks. These instructions provide you with information about caring for yourself after your procedure. Your health care provider may also give you more specific instructions. Your treatment has been planned according to current medical practices, but problems sometimes occur. Call your health care provider if you have any problems or questions after your procedure. What can I expect after the procedure? After your procedure, it is common to feel sore for up to two weeks. Follow these instructions at home:  There are many different ways to close and cover an incision, including stitches (sutures), skin glue, and adhesive strips. Follow your health care provider's instructions about: ? Incision care. ? Bandage (dressing) changes and removal. ? Incision closure removal.  Check  your incision area every day for signs of infection. Watch for: ? Redness, swelling, or pain. ? Fluid, blood, or pus.  Take medicines only as directed by your health care provider.  Try to cough often. Coughing helps to protect against lung infection (pneumonia). It may hurt to cough. If this happens, hold a pillow against your chest when you cough.  Take deep breaths. This also helps to protect against pneumonia.  If you were given an incentive spirometer, use it as directed by your health care provider.  Do not take baths, swim, or use a hot tub until your health care provider approves. You may take showers.  Avoid lifting until your health care provider approves.  Avoid driving until your health care provider approves.  Do not travel by airplane after the chest tube is removed until your health care provider approves. Contact a health care provider if:  You have a fever.  Pain medicines do not ease your pain.  You have redness, swelling, or increasing pain in  your incision area.  You develop a cough that does not go away, or you are coughing up mucus that is yellow or green. Get help right away if:  You have fluid, blood, or pus coming from your incision.  There is a bad smell coming from your incision or dressing.  You develop a rash.  You have difficulty breathing.  You cough up blood.  You develop light-headedness or you feel faint.  You develop chest pain.  Your heartbeat feels irregular or very fast. This information is not intended to replace advice given to you by your health care provider. Make sure you discuss any questions you have with your health care provider. Document Released: 07/27/2004 Document Revised: 09/10/2015 Document Reviewed: 09/22/2013 Elsevier Interactive Patient Education  2018 [employer].

## 2017-08-22 NOTE — Plan of Care (Signed)
  Problem: Education: Goal: Knowledge of General Education information will improve Description Including pain rating scale, medication(s)/side effects and non-pharmacologic comfort measures Outcome: Progressing   Problem: Education: Goal: Knowledge of disease or condition will improve Outcome: Progressing Goal: Knowledge of the prescribed therapeutic regimen will improve Outcome: Progressing   Problem: Activity: Goal: Risk for activity intolerance will decrease Outcome: Progressing   Problem: Cardiac: Goal: Will achieve and/or maintain hemodynamic stability Outcome: Progressing   Problem: Respiratory: Goal: Respiratory status will improve Outcome: Progressing   Problem: Pain Management: Goal: Pain level will decrease Outcome: Progressing   Problem: Skin Integrity: Goal: Wound healing without signs and symptoms infection will improve Outcome: Progressing

## 2017-08-22 NOTE — Discharge Summary (Addendum)
Physician Discharge Summary  Patient ID: Audrey Russell MRN: [id number] DOB/AGE: October 06, 1955 62 y.o.  Admit date: 08/15/2017 Discharge date: 08/27/2017  Admission Diagnoses: Right upper lobe lung nodule  Discharge Diagnoses: Adenocarcinoma right upper lobe- stage IA (T1,N0) Active Problems:   S/P lobectomy of lung  Patient Active Problem List   Diagnosis Date Noted  . S/P lobectomy of lung 08/15/2017  . DOE (dyspnea on exertion) 06/27/2017  . Cigarette smoker 06/27/2017  . Pulmonary infiltrates 06/26/2017  . Headache 11/04/2016  . Diarrhea 05/25/2016  . Abdominal pain 05/25/2016  . Bipolar affective ([place]) 05/24/2016  . MS (multiple sclerosis) ([place]) 05/24/2016  . Essential hypertension 04/04/2016  . DM (diabetes mellitus) ([place]) 04/04/2016  . Chronic pain 04/04/2016  . Depression 04/04/2016  . Hypertriglyceridemia 04/04/2016  . Leg weakness, bilateral 04/04/2016    History the present illness: The patient is a 62 year old female with a history of tobacco abuse.  She smoked 1 pack/day for approximately 44 years.  She also has multiple medical diagnoses including COPD, hypertension, hyperlipidemia, multiple sclerosis, arthritis, fibromyalgia, chronic pain, type 2 diabetes, and bipolar disorder.  Early this year she developed a persistent cough and sought medical care.  She was initially treated for presumed bronchitis and a chest x-ray was done which revealed a vague patchy opacity in the right lung.  Her cough persisted and a repeat chest x-ray in April showed no improvement in the opacity.  A CT scan was then done and this revealed significant emphysema as well as a 3.4 x 2.9 cm cavitary lesion in the right upper lobe with wall thickening and an 8 mm nodular component.  There was no significant adenopathy.  She was referred to Dr. Melvyn Novas in pulmonology.  He tested for TB and QuantiFERON gold was negative.  Pulmonary function studies showed significant diffusion capacity impairment.  A  PET/CT scan showed the right upper nodule was hypermetabolic with an SUV of 3.3.  There is also a small liver lesion noted on the CT of the chest.  This did not have any activity on PET scan.  MRI of the abdomen was indeterminate.  Was felt to be benign.  Follow-up MRI is recommended in the future in approximately 6 months.  She was referred to Modesto Charon, MD for cardiothoracic surgical opinion and he evaluated the patient and her studies and agreed with recommendations to proceed with surgical resection.  She was admitted this hospitalization for the procedure.    Discharged Condition: good  Hospital Course: Patient was admitted electively and on 08/18/2017 she was taken to the operating room where she underwent the below described procedure.  She tolerated well was taken to the postanesthesia care unit in stable condition.  Postoperative hospital course:  The patient was noted to have a prolonged air leak which was followed in the routine manner over time.  Serial chest x-rays were obtained to assist with management.  She did develop some subcutaneous air as well.  Medically stable with some hypertension which will require further management as outpatient per her primary.  Blood sugars have been under adequate control using usual protocols.  Chest x-ray was very stable on 731 with a right upper lobe space and at that time showed no air leak.  The chest tube was removed at that time.  Unfortunately, follow-up chest x-ray revealed increased in pneumothorax requiring placement of a pigtail chest tube catheter.  This has shown improved expansion of the lung.  The patient does have a mild expected acute blood loss  anemia and values are very stable.  Most recent hemoglobin is 11.5.  Renal function is within normal limits with most recent BUN and creatinine 7/0.74.  We were able to place the pigtail catheter on waterseal and she underwent a chest x-ray the following morning.  There was a small apical  pneumothorax on follow-up chest x-ray with no air leak.  It was determined safe to remove the pigtail catheter at this time.  Follow-up chest x-ray today showed a very small stable right apical pneumothorax.  The patient is tolerating room air with good oxygenation saturation.  Her pain is well controlled at this time.  She is ambulating in the hall with limited assistance.  She is stable to be discharged.  Consults: None  Significant Diagnostic Studies: Routine postoperative serial chest x-rays and labs.  Treatments: surgery:   OPERATIVE REPORT  DATE OF PROCEDURE:  08/15/2017  PREOPERATIVE DIAGNOSIS:  Cavitary right upper lobe lung nodule, suspected clinical stage IB(T2N0) nonsmall cell carcinoma.  POSTOPERATIVE DIAGNOSIS:  Clinical stage 1B (T2, N0) adenocarcinoma, right upper lobe.  PROCEDURE:   Right video-assisted thoracoscopy, Wedge resection right upper lobe nodule, Thoracoscopic right upper lobectomy, Mediastinal lymph node dissection, Intercostal nerve block.  SURGEON:  Modesto Charon, MD   ASSISTANT:  Jadene Pierini, PA  ANESTHESIA:  General.  FINDINGS:  Mass in the lateral aspect of the right upper lobe.  Frozen section positive for adenocarcinoma.  Bronchial margin negative for tumor.  Severe fibrotic reaction around otherwise benign appearing lymph nodes.   Discharge Exam: Blood pressure (!) 101/51, pulse 80, temperature 98.2 F (36.8 C), temperature source Oral, resp. rate 10, height 5\' 9"  (1.753 m), weight 72.8 kg (160 lb 8 oz), SpO2 92 %.    General appearance: alert, cooperative and no distress Heart: regular rate and rhythm, S1, S2 normal, no murmur, click, rub or gallop Lungs: clear to auscultation bilaterally Abdomen: soft, non-tender; bowel sounds normal; no masses,  no organomegaly Extremities: extremities normal, atraumatic, no cyanosis or edema Wound: clean and dry    Disposition: Discharge disposition: 01-Home or Self  Care        Allergies as of 08/27/2017   No Known Allergies     Medication List    STOP taking these medications   saccharomyces boulardii 250 MG capsule Commonly known as:  FLORASTOR     TAKE these medications   acetaminophen 500 MG tablet Commonly known as:  TYLENOL Take 1,000 mg by mouth every 6 (six) hours as needed for mild pain or moderate pain.   cetirizine 10 MG tablet Commonly known as:  ZYRTEC TAKE 1 TABLET BY MOUTH EVERYDAY AT BEDTIME   citalopram 40 MG tablet Commonly known as:  CELEXA Take 1 tablet (40 mg total) by mouth daily.   ezetimibe 10 MG tablet Commonly known as:  ZETIA Take 1 tablet (10 mg total) by mouth every evening.   fenofibrate 160 MG tablet Take 1 tablet (160 mg total) by mouth daily. What changed:    medication strength  how much to take   fluticasone 50 MCG/ACT nasal spray Commonly known as:  FLONASE SPRAY 2 SPRAYS INTO EACH NOSTRIL EVERY DAY   gabapentin 300 MG capsule Commonly known as:  NEURONTIN Take 1 capsule (300 mg total) by mouth at bedtime.   ketoconazole 2 % cream Commonly known as:  NIZORAL Apply 1 application topically 2 (two) times daily.   lamoTRIgine 100 MG tablet Commonly known as:  LAMICTAL Take 1 tablet (100 mg total) by mouth daily.  metFORMIN 500 MG 24 hr tablet Commonly known as:  GLUCOPHAGE-XR Take 1 tablet (500 mg total) by mouth daily with breakfast.   mirtazapine 30 MG tablet Commonly known as:  REMERON Take 1 tablet (30 mg total) by mouth at bedtime.   oxyCODONE 5 MG immediate release tablet Commonly known as:  Oxy IR/ROXICODONE Take 1 tablet (5 mg total) by mouth every 6 (six) hours as needed for severe pain.   ranitidine 300 MG tablet Commonly known as:  ZANTAC Take 1 tablet (300 mg total) by mouth at bedtime.   sodium chloride 0.65 % Soln nasal spray Commonly known as:  OCEAN Place 1 spray into both nostrils as needed for congestion.      Follow-up Information    Melrose Nakayama, MD Follow up.   Specialty:  Cardiothoracic Surgery Why:  Your routine follow-up appointment is on September 09, 2017 at 1:45 PM.  Please arrive at 1:15 PM for a chest x-ray located at [facility] imaging which is on the first floor of our building.  Contact information: [address] [place] [address] [phone]        Flossie Buffy, NP. Call in 1 day(s).   Specialty:  Internal Medicine Contact information: [address] [address] [phone]           Signed: Elgie Collard 08/27/2017, 7:58 AM

## 2017-08-22 NOTE — Progress Notes (Signed)
7 Days Post-Op Procedure(s) (LRB): RIGHT VIDEO ASSISTED THORACOSCOPY (VATS)/WEDGE AND LOBECTOMY (Right) Subjective: Feels better this AM  Objective: Vital signs in last 24 hours: Temp:  [98.3 F (36.8 C)-99.2 F (37.3 C)] 98.3 F (36.8 C) (08/02 0344) Pulse Rate:  [87-116] 87 (08/02 0344) Cardiac Rhythm: Sinus tachycardia (08/01 1945) Resp:  [13-22] 13 (08/02 0344) BP: (110-130)/(71-78) 130/78 (08/01 1935) SpO2:  [92 %-98 %] 98 % (08/02 0344)  Hemodynamic parameters for last 24 hours:    Intake/Output from previous day: 08/01 0701 - 08/02 0700 In: 600.4 [P.O.:480; I.V.:120.4] Out: 54 [Chest Tube:54] Intake/Output this shift: Total I/O In: 360.4 [P.O.:240; I.V.:120.4] Out: 33 [Chest Tube:54]  General appearance: alert, cooperative and no distress Heart: regular rate and rhythm Lungs: diminished breath sounds on right small air leak  Lab Results: Recent Labs    08/21/17 0816  WBC 12.4*  HGB 11.5*  HCT 36.2  PLT 258   BMET: No results for input(s): NA, K, CL, CO2, GLUCOSE, BUN, CREATININE, CALCIUM in the last 72 hours.  PT/INR: No results for input(s): LABPROT, INR in the last 72 hours. ABG    Component Value Date/Time   PHART 7.394 08/16/2017 0553   HCO3 25.2 08/16/2017 0553   ACIDBASEDEF 0.6 08/13/2017 1502   O2SAT 97.1 08/16/2017 0553   CBG (last 3)  Recent Labs    08/21/17 1145 08/21/17 1702 08/21/17 2136  GLUCAP 104* 166* 132*    Assessment/Plan: S/P Procedure(s) (LRB): RIGHT VIDEO ASSISTED THORACOSCOPY (VATS)/WEDGE AND LOBECTOMY (Right) -Pigtail placed yesterday for pneumothorax. She has a very small air leak this AM. CXR shows improved expansion of lung. There still is an apical space. Will keep CT to suction today.  Plan to try on water seal tomorrow   LOS: 7 days    Melrose Nakayama 08/22/2017

## 2017-08-22 NOTE — Care Management Note (Signed)
Case Management Note  Patient Details  Name: Audrey Russell MRN: [id number] Date of Birth: [birth year]  Subjective/Objective:   Pt is s/p VATS                 Action/Plan:  PTA independent from home.  CM will continue to follow for discharge needs   Expected Discharge Date:                  Expected Discharge Plan:  Home/Self Care(PTA Independent from home, pt has PCP and denied barriers with paying for medications)  In-House Referral:     Discharge planning Services  CM Consult  Post Acute Care Choice:    Choice offered to:     DME Arranged:    DME Agency:     HH Arranged:    HH Agency:     Status of Service:     If discussed at [employer], dates discussed:    Additional Comments: 08/22/2017 Pt continues to have CT Maryclare Labrador, RN 08/22/2017, 3:57 PM

## 2017-08-23 ENCOUNTER — Inpatient Hospital Stay (HOSPITAL_COMMUNITY): Payer: PPO

## 2017-08-23 LAB — GLUCOSE, CAPILLARY
GLUCOSE-CAPILLARY: 113 mg/dL — AB (ref 70–99)
GLUCOSE-CAPILLARY: 138 mg/dL — AB (ref 70–99)
Glucose-Capillary: 107 mg/dL — ABNORMAL HIGH (ref 70–99)
Glucose-Capillary: 117 mg/dL — ABNORMAL HIGH (ref 70–99)

## 2017-08-23 MED ORDER — KETOROLAC TROMETHAMINE 15 MG/ML IJ SOLN
15.0000 mg | Freq: Four times a day (QID) | INTRAMUSCULAR | Status: DC | PRN
  Administered 2017-08-23 – 2017-08-24 (×2): 15 mg via INTRAVENOUS
  Filled 2017-08-23 (×2): qty 1

## 2017-08-23 NOTE — Progress Notes (Addendum)
      [address]       [place] [address]             [phone]      8 Days Post-Op Procedure(s) (LRB): RIGHT VIDEO ASSISTED THORACOSCOPY (VATS)/WEDGE AND LOBECTOMY (Right) Subjective: Still is getting short of breath while ambulating in the halls. Her pain is well controlled on her current regimen.   Objective: Vital signs in last 24 hours: Temp:  [98.1 F (36.7 C)-99.3 F (37.4 C)] 98.5 F (36.9 C) (08/03 0807) Pulse Rate:  [88-110] 96 (08/03 0807) Cardiac Rhythm: Normal sinus rhythm (08/03 0700) Resp:  [12-28] 19 (08/03 0807) BP: (110-173)/(71-79) 130/74 (08/03 0807) SpO2:  [93 %-98 %] 98 % (08/03 0807)     Intake/Output from previous day: 08/02 0701 - 08/03 0700 In: 800 [P.O.:800] Out: 222 [Urine:200; Chest Tube:22] Intake/Output this shift: No intake/output data recorded.  General appearance: alert, cooperative and no distress Heart: regular rate and rhythm, S1, S2 normal, no murmur, click, rub or gallop Lungs: clear to auscultation bilaterally Abdomen: soft, non-tender; bowel sounds normal; no masses,  no organomegaly Extremities: extremities normal, atraumatic, no cyanosis or edema Wound: clean and dry  Lab Results: Recent Labs    08/21/17 0816  WBC 12.4*  HGB 11.5*  HCT 36.2  PLT 258   BMET: No results for input(s): NA, K, CL, CO2, GLUCOSE, BUN, CREATININE, CALCIUM in the last 72 hours.  PT/INR: No results for input(s): LABPROT, INR in the last 72 hours. ABG    Component Value Date/Time   PHART 7.394 08/16/2017 0553   HCO3 25.2 08/16/2017 0553   ACIDBASEDEF 0.6 08/13/2017 1502   O2SAT 97.1 08/16/2017 0553   CBG (last 3)  Recent Labs    08/22/17 1712 08/22/17 2104 08/23/17 0810  GLUCAP 187* 126* 113*    Assessment/Plan: S/P Procedure(s) (LRB): RIGHT VIDEO ASSISTED THORACOSCOPY (VATS)/WEDGE AND LOBECTOMY (Right)  1. Pigtail catheter placed 8/1 for pneumothorax, CXR today showed slight improvement in the right  pneumothorax when compared to prior exam. 2. CV-NSR in the 80s, BP well controlled  3. H and H remains stable at 11.5/36.2, platelets 258k 4. Continue lovenox for DVT prophylaxis  5. Endo- blood glucose well controlled on SSI  Plan: There remains a small air leak on suction. CXR is stable. Given the patient's shortness of breath with ambulation and even with talking would favor leaving the pigtail to suction one more day before trying water seal. I will discuss with Dr. Cyndia Bent.     LOS: 8 days    Elgie Collard 08/23/2017   Chart reviewed, patient examined, agree with above. There is a small air leak. CXR is stable with right apical ptx which is really probably a space due to lung volume reduction. I would continue to suction for now. This air leak will stop. Possibly reduce suction tomorrow.

## 2017-08-23 NOTE — Progress Notes (Signed)
Dr. Cyndia Bent notified of patients constant pain she feels around her right chest tube site. Toradol ordered Q6hrs PRN and recheck her Bmet in am to check on her creatinine. Orders placed. Will continue to patients pain progress.

## 2017-08-24 ENCOUNTER — Inpatient Hospital Stay (HOSPITAL_COMMUNITY): Payer: PPO

## 2017-08-24 LAB — BASIC METABOLIC PANEL
Anion gap: 9 (ref 5–15)
BUN: 18 mg/dL (ref 8–23)
CALCIUM: 9.3 mg/dL (ref 8.9–10.3)
CHLORIDE: 103 mmol/L (ref 98–111)
CO2: 29 mmol/L (ref 22–32)
CREATININE: 0.93 mg/dL (ref 0.44–1.00)
Glucose, Bld: 130 mg/dL — ABNORMAL HIGH (ref 70–99)
Potassium: 3.8 mmol/L (ref 3.5–5.1)
SODIUM: 141 mmol/L (ref 135–145)

## 2017-08-24 LAB — CULTURE, RESPIRATORY W GRAM STAIN: Culture: NORMAL

## 2017-08-24 LAB — GLUCOSE, CAPILLARY
GLUCOSE-CAPILLARY: 124 mg/dL — AB (ref 70–99)
GLUCOSE-CAPILLARY: 121 mg/dL — AB (ref 70–99)
Glucose-Capillary: 124 mg/dL — ABNORMAL HIGH (ref 70–99)
Glucose-Capillary: 197 mg/dL — ABNORMAL HIGH (ref 70–99)

## 2017-08-24 LAB — CULTURE, RESPIRATORY

## 2017-08-24 NOTE — Progress Notes (Addendum)
      [address]       [place] [address]             [phone]      9 Days Post-Op Procedure(s) (LRB): RIGHT VIDEO ASSISTED THORACOSCOPY (VATS)/WEDGE AND LOBECTOMY (Right) Subjective: Pain is better controlled on Toradol.   Objective: Vital signs in last 24 hours: Temp:  [98.1 F (36.7 C)-99.8 F (37.7 C)] 98.1 F (36.7 C) (08/04 0734) Pulse Rate:  [82-99] 88 (08/04 0734) Cardiac Rhythm: Normal sinus rhythm (08/04 0000) Resp:  [14-26] 18 (08/04 0734) BP: (100-131)/(63-89) 100/68 (08/04 0734) SpO2:  [97 %-98 %] 97 % (08/04 0734)     Intake/Output from previous day: 08/03 0701 - 08/04 0700 In: 745 [P.O.:725; I.V.:20] Out: 1058 [Urine:1050; Chest Tube:8] Intake/Output this shift: No intake/output data recorded.  General appearance: alert, cooperative and no distress Heart: regular rate and rhythm, S1, S2 normal, no murmur, click, rub or gallop Lungs: clear to auscultation bilaterally Abdomen: soft, non-tender; bowel sounds normal; no masses,  no organomegaly Extremities: extremities normal, atraumatic, no cyanosis or edema Wound: clean and dry  Lab Results: No results for input(s): WBC, HGB, HCT, PLT in the last 72 hours. BMET:  Recent Labs    08/24/17 0441  NA 141  K 3.8  CL 103  CO2 29  GLUCOSE 130*  BUN 18  CREATININE 0.93  CALCIUM 9.3    PT/INR: No results for input(s): LABPROT, INR in the last 72 hours. ABG    Component Value Date/Time   PHART 7.394 08/16/2017 0553   HCO3 25.2 08/16/2017 0553   ACIDBASEDEF 0.6 08/13/2017 1502   O2SAT 97.1 08/16/2017 0553   CBG (last 3)  Recent Labs    08/23/17 1715 08/23/17 2157 08/24/17 0825  GLUCAP 138* 117* 124*    Assessment/Plan: S/P Procedure(s) (LRB): RIGHT VIDEO ASSISTED THORACOSCOPY (VATS)/WEDGE AND LOBECTOMY (Right)  1. Pigtail catheter placed 8/1 for pneumothorax, Await CXR this morning 2. CV-NSR in the 80s-90s, BP well controlled  3. H and H remains stable at  11.5/36.2, platelets 258k 4. Creatinine 0.93, electrolytes okay 5. Continue lovenox for DVT prophylaxis  6. Endo- blood glucose well controlled on SSI 7. Continue Toradol for pain which is much improved.   Plan: May reduce suction today based on CXR. Continue to ambulate. Could not appreciate a leak on suction. Continue to use spirometer hourly.    LOS: 9 days    Elgie Collard 08/24/2017   Chart reviewed, patient examined, agree with above. CXR is stable with small apical space. Will decrease suction to 10 cm today and if CXR stable may consider water seal tomorrow.

## 2017-08-25 ENCOUNTER — Inpatient Hospital Stay (HOSPITAL_COMMUNITY): Payer: PPO

## 2017-08-25 LAB — GLUCOSE, CAPILLARY
GLUCOSE-CAPILLARY: 105 mg/dL — AB (ref 70–99)
GLUCOSE-CAPILLARY: 157 mg/dL — AB (ref 70–99)
Glucose-Capillary: 119 mg/dL — ABNORMAL HIGH (ref 70–99)
Glucose-Capillary: 97 mg/dL (ref 70–99)

## 2017-08-25 NOTE — Progress Notes (Addendum)
[address]       [place] [address]             [phone]      10 Days Post-Op Procedure(s) (LRB): RIGHT VIDEO ASSISTED THORACOSCOPY (VATS)/WEDGE AND LOBECTOMY (Right) Subjective: conts to feel better  Objective: Vital signs in last 24 hours: Temp:  [98.2 F (36.8 C)-98.9 F (37.2 C)] 98.3 F (36.8 C) (08/05 0759) Pulse Rate:  [89-121] 121 (08/05 0759) Cardiac Rhythm: Normal sinus rhythm (08/05 0742) Resp:  [15-28] 18 (08/05 0759) BP: (105-140)/(57-88) 105/75 (08/05 0759) SpO2:  [92 %-98 %] 92 % (08/05 0759)  Hemodynamic parameters for last 24 hours:    Intake/Output from previous day: 08/04 0701 - 08/05 0700 In: 1080 [P.O.:1080] Out: 604 [Urine:600; Chest Tube:4] Intake/Output this shift: No intake/output data recorded.  General appearance: alert, cooperative and no distress Heart: regular rate and rhythm Lungs: clear to auscultation bilaterally Abdomen: benign Extremities: no edema or calf tenderness Wound: incis healing well  Lab Results: No results for input(s): WBC, HGB, HCT, PLT in the last 72 hours. BMET:  Recent Labs    08/24/17 0441  NA 141  K 3.8  CL 103  CO2 29  GLUCOSE 130*  BUN 18  CREATININE 0.93  CALCIUM 9.3    PT/INR: No results for input(s): LABPROT, INR in the last 72 hours. ABG    Component Value Date/Time   PHART 7.394 08/16/2017 0553   HCO3 25.2 08/16/2017 0553   ACIDBASEDEF 0.6 08/13/2017 1502   O2SAT 97.1 08/16/2017 0553   CBG (last 3)  Recent Labs    08/24/17 1703 08/24/17 2127 08/25/17 0758  GLUCAP 197* 121* 119*    Meds Scheduled Meds: . amoxicillin-clavulanate  1 tablet Oral Q12H  . bisacodyl  10 mg Oral Daily  . citalopram  40 mg Oral Daily  . enoxaparin (LOVENOX) injection  40 mg Subcutaneous Daily  . ezetimibe  10 mg Oral QPM  . fenofibrate  160 mg Oral Daily  . fluticasone  2 spray Each Nare Daily  . gabapentin  300 mg Oral QHS  . insulin aspart  0-15 Units Subcutaneous TID  WC  . ketoconazole  1 application Topical BID  . lamoTRIgine  100 mg Oral Daily  . loratadine  10 mg Oral Daily  . mouth rinse  15 mL Mouth Rinse BID  . metFORMIN  500 mg Oral Q breakfast  . mirtazapine  30 mg Oral QHS  . multivitamin with minerals  1 tablet Oral Daily  . pantoprazole  40 mg Oral Daily  . senna-docusate  1 tablet Oral QHS  . sodium chloride flush  10-40 mL Intracatheter Q12H   Continuous Infusions: . sodium chloride 10 mL/hr at 08/20/17 0800   PRN Meds:.ketorolac, levalbuterol, lidocaine (PF), morphine injection, ondansetron (ZOFRAN) IV, oxyCODONE, sodium chloride, sodium chloride flush, traMADol  Xrays Dg Chest Port 1 View  Result Date: 08/25/2017 CLINICAL DATA:  62 year old female. Pneumothorax with chest tube. Shortness breath. Subsequent encounter. EXAM: PORTABLE CHEST 1 VIEW COMPARISON:  08/24/2017 chest x-ray. FINDINGS: Right central line tip proximal superior vena cava. Right-sided chest tube in place similar in appearance to prior exam. Right chest wall subcutaneous emphysema. Moderate-size right apical pneumothorax similar to prior exam (approximately 20%). Prior surgery right hilar region. Elevated right hemidiaphragm with right base atelectasis. Mild progressive subsegmental atelectasis left lung base. Heart size top-normal to slightly enlarged.  Calcified aorta. IMPRESSION: 1. Similar appearance of moderate-size right apical pneumothorax (approximately 20%).  Right chest wall subcutaneous emphysema once again noted. Right chest tube in place. 2. Postsurgical changes right lung with elevated right hemidiaphragm and right base subsegmental atelectasis unchanged. 3. Slight progressive subsegmental atelectasis left lung base. 4.  Aortic Atherosclerosis (ICD10-I70.0). 5. Heart size top-normal to slightly enlarged. Electronically Signed   By: Genia Del M.D.   On: 08/25/2017 07:54   Dg Chest Port 1 View  Result Date: 08/24/2017 CLINICAL DATA:  Follow-up pneumothorax  EXAM: PORTABLE CHEST 1 VIEW COMPARISON:  08/23/2017 FINDINGS: Cardiac shadow is stable. Right jugular central line and pigtail catheter are again seen and stable. Left lung is clear. Slight improvement in right apical pneumothorax is noted. Postsurgical changes on the right are again seen. IMPRESSION: Slight improvement in right apical pneumothorax. Electronically Signed   By: Inez Catalina M.D.   On: 08/24/2017 08:49   Results for orders placed or performed during the hospital encounter of 08/15/17  Expectorated sputum assessment w rflx to resp cult     Status: None   Collection Time: 08/21/17  3:52 PM  Result Value Ref Range Status   Specimen Description EXPECTORATED SPUTUM  Final   Special Requests Immunocompromised  Final   Sputum evaluation   Final    THIS SPECIMEN IS ACCEPTABLE FOR SPUTUM CULTURE Performed at [facility], [place] [address], [place], [place] [address]    Report Status 08/21/2017 FINAL  Final  Culture, respiratory     Status: None   Collection Time: 08/21/17  3:52 PM  Result Value Ref Range Status   Specimen Description EXPECTORATED SPUTUM  Final   Special Requests Immunocompromised Reflexed from [id number]  Final   Gram Stain   Final    ABUNDANT WBC PRESENT, PREDOMINANTLY PMN MODERATE GRAM POSITIVE COCCI IN CHAINS FEW GRAM NEGATIVE RODS    Culture   Final    MODERATE Consistent with normal respiratory flora. Performed at [facility], [place] [address], [place], [place] [address]    Report Status 08/24/2017 FINAL  Final   Assessment/Plan: S/P Procedure(s) (LRB): RIGHT VIDEO ASSISTED THORACOSCOPY (VATS)/WEDGE AND LOBECTOMY (Right)  1 conts to progress, no fevers- conts augmentin for bronchitis 2 I see no air leak, CXR is stable- will place to H2O seal today 3 blood sugar control is reasonable 4 no new labs 5 recheck bmet in am 6 cont to push rehab and pulm rx/toilet  LOS: 10 days    John Giovanni 08/25/2017 Pager [phone]  Cont augmentin patient  examined and medical record reviewed,agree with above note. Tharon Aquas Trigt III 08/25/2017

## 2017-08-25 NOTE — Care Management Important Message (Signed)
Important Message  Patient Details  Name: Audrey Russell MRN: [id number] Date of Birth: [birth year]   Medicare Important Message Given:  Yes    Barb Merino [name] 08/25/2017, 3:26 PM

## 2017-08-26 ENCOUNTER — Inpatient Hospital Stay (HOSPITAL_COMMUNITY): Payer: PPO

## 2017-08-26 ENCOUNTER — Ambulatory Visit (HOSPITAL_COMMUNITY): Payer: PPO | Admitting: Psychiatry

## 2017-08-26 LAB — GLUCOSE, CAPILLARY
GLUCOSE-CAPILLARY: 103 mg/dL — AB (ref 70–99)
Glucose-Capillary: 118 mg/dL — ABNORMAL HIGH (ref 70–99)
Glucose-Capillary: 104 mg/dL — ABNORMAL HIGH (ref 70–99)
Glucose-Capillary: 128 mg/dL — ABNORMAL HIGH (ref 70–99)

## 2017-08-26 LAB — BASIC METABOLIC PANEL
Anion gap: 9 (ref 5–15)
BUN: 12 mg/dL (ref 8–23)
CHLORIDE: 104 mmol/L (ref 98–111)
CO2: 29 mmol/L (ref 22–32)
CREATININE: 0.88 mg/dL (ref 0.44–1.00)
Calcium: 9.6 mg/dL (ref 8.9–10.3)
GFR calc Af Amer: >60 mL/min (ref >60)
GFR calc non Af Amer: >60 mL/min (ref >60)
GLUCOSE: 122 mg/dL — AB (ref 70–99)
POTASSIUM: 4.2 mmol/L (ref 3.5–5.1)
Sodium: 142 mmol/L (ref 135–145)

## 2017-08-26 NOTE — Addendum Note (Signed)
Addendum  created 08/26/17 1217 by Lillia Abed, MD   Intraprocedure Blocks edited, Sign clinical note

## 2017-08-26 NOTE — Plan of Care (Signed)
  Problem: Education: Goal: Knowledge of General Education information will improve Description Including pain rating scale, medication(s)/side effects and non-pharmacologic comfort measures Outcome: Progressing   Problem: Education: Goal: Knowledge of disease or condition will improve Outcome: Progressing Goal: Knowledge of the prescribed therapeutic regimen will improve Outcome: Progressing   Problem: Activity: Goal: Risk for activity intolerance will decrease Outcome: Progressing   Problem: Cardiac: Goal: Will achieve and/or maintain hemodynamic stability Outcome: Progressing   Problem: Clinical Measurements: Goal: Postoperative complications will be avoided or minimized Outcome: Progressing   Problem: Respiratory: Goal: Respiratory status will improve Outcome: Progressing   Problem: Pain Management: Goal: Pain level will decrease Outcome: Progressing   Problem: Skin Integrity: Goal: Wound healing without signs and symptoms infection will improve Outcome: Progressing

## 2017-08-26 NOTE — Progress Notes (Addendum)
      [address]       [place] [address]             [phone]      11 Days Post-Op Procedure(s) (LRB): RIGHT VIDEO ASSISTED THORACOSCOPY (VATS)/WEDGE AND LOBECTOMY (Right) Subjective: No issues overnight. Her pain has been well controlled. Her appetite is coming back.   Objective: Vital signs in last 24 hours: Temp:  [98.1 F (36.7 C)-99 F (37.2 C)] 98.1 F (36.7 C) (08/06 0322) Pulse Rate:  [78-121] 87 (08/06 0322) Cardiac Rhythm: Normal sinus rhythm (08/05 1930) Resp:  [14-31] 24 (08/06 0322) BP: (105-139)/(70-86) 118/79 (08/06 0322) SpO2:  [92 %-98 %] 98 % (08/06 0322) Weight:  [72.8 kg (160 lb 8 oz)] 72.8 kg (160 lb 8 oz) (08/06 0322)     Intake/Output from previous day: 08/05 0701 - 08/06 0700 In: 1080 [P.O.:1080] Out: 2563 [Urine:1050] Intake/Output this shift: No intake/output data recorded.  General appearance: alert, cooperative and no distress Heart: regular rate and rhythm, S1, S2 normal, no murmur, click, rub or gallop Lungs: clear to auscultation bilaterally Abdomen: soft, non-tender; bowel sounds normal; no masses,  no organomegaly Extremities: extremities normal, atraumatic, no cyanosis or edema Wound: clean and dry  Lab Results: No results for input(s): WBC, HGB, HCT, PLT in the last 72 hours. BMET:  Recent Labs    08/24/17 0441 08/26/17 0542  NA 141 142  K 3.8 4.2  CL 103 104  CO2 29 29  GLUCOSE 130* 122*  BUN 18 12  CREATININE 0.93 0.88  CALCIUM 9.3 9.6    PT/INR: No results for input(s): LABPROT, INR in the last 72 hours. ABG    Component Value Date/Time   PHART 7.394 08/16/2017 0553   HCO3 25.2 08/16/2017 0553   ACIDBASEDEF 0.6 08/13/2017 1502   O2SAT 97.1 08/16/2017 0553   CBG (last 3)  Recent Labs    08/25/17 1215 08/25/17 1716 08/25/17 2110  GLUCAP 97 105* 157*    Assessment/Plan: S/P Procedure(s) (LRB): RIGHT VIDEO ASSISTED THORACOSCOPY (VATS)/WEDGE AND LOBECTOMY (Right)  1. CV-NSR 80s, BP  well controlled.  2. Pulm-tolerating room air with good oxygen saturation. CXR is stable with a small right apical pneumothorax and decreased subcutaneous emphysema. Chest tube on water seal.  3. Renal-creatinine 0.88, electrolytes are okay  4. Blood glucose well controlled 5. Continue lovenox for DVT prophylaxis.  6. Continue Augmentin  Plan: Could consider removing chest tube today. No air leak on exam today. Possibly home tomorrow.     LOS: 11 days    Elgie Collard 08/26/2017   Right chest tube no air leak or tidal movement Very small apical ptx D/c chest tube and central line  Follow up pa lat hest xray in am' I have seen and examined [name] and agree with the above assessment  and plan.  Grace Isaac MD Beeper [phone] Office [phone] 08/26/2017 9:26 AM

## 2017-08-26 NOTE — Progress Notes (Signed)
Nutrition Follow-up  DOCUMENTATION CODES:   Not applicable  INTERVENTION:   -Continue MVI with minerals daily -Continue Snacks TID  NUTRITION DIAGNOSIS:   Increased nutrient needs related to post-op healing as evidenced by estimated needs.  Ongoing  GOAL:   Patient will meet greater than or equal to 90% of their needs  Progressing  MONITOR:   PO intake, Supplement acceptance, Labs, Weight trends, Skin, I & O's  REASON FOR ASSESSMENT:   Malnutrition Screening Tool    ASSESSMENT:   Audrey Russell is a 62 year old woman with a history of tobacco abuse (1 pack/day x 44 years), COPD, hypertension, hyperlipidemia, multiple sclerosis, arthritis, fibromyalgia, chronic pain, type 2 diabetes without complication, and bipolar disorder.  She developed a persistent cough earlier this year  7/26- s/pRight video-assisted thoracoscopy, wedge resection, right upper lobe nodule, thoracoscopic right upper lobectomy, mediastinal lymph node dissection, intercostal nerve block. 7/29- CT revealed small PTX and airleak 7/30- transitioned chest tube to water seal 7/31- chest tube removed 8/1- CT revealed PTX, chest tube re-inserted  Reviewed I/O's: 0 ml chest tube output. Net I/O's: +30 ml x 24 hours and +3.6 L since admission  Case discussed with RN prior to visit; plan to removed chest tube later on today.   Spoke with pt at bedside, who was very lively and in good spirits today. She is eager to have chest tube removed.   She reports great appetite, usually consuming most of her meals. Documented meal completion 75-100%. Pt with no further concerns regarding her nutrition at this time. Reinforced importance of continued goof oral intake to promote healing.   Labs reviewed: CBGS: 105-157 (inpatient orders for glycemic control are 0-15 units insulin aspart TID with meals and 500 mg metformin daily).   Diet Order:   Diet Order           Diet Carb Modified Fluid consistency: Thin;  Room service appropriate? Yes  Diet effective now          EDUCATION NEEDS:   Education needs have been addressed  Skin:  Skin Assessment: Skin Integrity Issues: Skin Integrity Issues:: Incisions Incisions: rt chest  Last BM:  08/18/17  Height:   Ht Readings from Last 1 Encounters:  08/15/17 5\' 9"  (1.753 m)    Weight:   Wt Readings from Last 1 Encounters:  08/26/17 160 lb 8 oz (72.8 kg)    Ideal Body Weight:  65.9 kg  BMI:  Body mass index is 23.7 kg/m.  Estimated Nutritional Needs:   Kcal:  5056-9794  Protein:  85-100 grams  Fluid:  1.7-1.9 L    Philo Kurtz A. Jimmye Norman, RD, LDN, CDE Pager: [phone] After hours Pager: [phone]

## 2017-08-26 NOTE — Care Management Note (Signed)
Case Management Note  Patient Details  Name: Audrey Russell MRN: [id number] Date of Birth: Sep 03, 1955  Subjective/Objective:   Pt is s/p VATS                 Action/Plan:  PTA independent from home.  CM will continue to follow for discharge needs   Expected Discharge Date:                  Expected Discharge Plan:  Home/Self Care(PTA Independent from home, pt has PCP and denied barriers with paying for medications)  In-House Referral:     Discharge planning Services  CM Consult  Post Acute Care Choice:    Choice offered to:     DME Arranged:    DME Agency:     HH Arranged:    HH Agency:     Status of Service:     If discussed at [employer], dates discussed:    Additional Comments: 08/26/2017  CT removed.  Per bedside nurse pt remains independently ambulatory.  CM will continue to follow for discharge needs  08/22/17 Pt continues to have CT Maryclare Labrador, RN 08/26/2017, 2:43 PM

## 2017-08-27 ENCOUNTER — Inpatient Hospital Stay (HOSPITAL_COMMUNITY): Payer: PPO

## 2017-08-27 MED ORDER — OXYCODONE HCL 5 MG PO TABS: 5.0000 mg | ORAL_TABLET | Freq: Four times a day (QID) | ORAL | 0 refills | Status: DC | PRN

## 2017-08-27 MED ORDER — FENOFIBRATE 160 MG PO TABS: 160.0000 mg | ORAL_TABLET | Freq: Every day | ORAL | 1 refills | Status: DC

## 2017-08-27 NOTE — Progress Notes (Addendum)
      [address]       [place] [address]             [phone]      12 Days Post-Op Procedure(s) (LRB): RIGHT VIDEO ASSISTED THORACOSCOPY (VATS)/WEDGE AND LOBECTOMY (Right) Subjective: No issues this morning. Pain is well controlled. She is eating a little better.   Objective: Vital signs in last 24 hours: Temp:  [98.2 F (36.8 C)-98.4 F (36.9 C)] 98.2 F (36.8 C) (08/07 0700) Pulse Rate:  [78-91] 80 (08/07 0700) Cardiac Rhythm: Normal sinus rhythm (08/07 0700) Resp:  [10-17] 10 (08/07 0700) BP: (96-157)/(51-87) 101/51 (08/07 0700) SpO2:  [92 %-98 %] 92 % (08/07 0700)     Intake/Output from previous day: 08/06 0701 - 08/07 0700 In: 520 [P.O.:520] Out: -  Intake/Output this shift: No intake/output data recorded.  General appearance: alert, cooperative and no distress Heart: regular rate and rhythm, S1, S2 normal, no murmur, click, rub or gallop Lungs: clear to auscultation bilaterally Abdomen: soft, non-tender; bowel sounds normal; no masses,  no organomegaly Extremities: extremities normal, atraumatic, no cyanosis or edema Wound: clean and dry  Lab Results: No results for input(s): WBC, HGB, HCT, PLT in the last 72 hours. BMET:  Recent Labs    08/26/17 0542  NA 142  K 4.2  CL 104  CO2 29  GLUCOSE 122*  BUN 12  CREATININE 0.88  CALCIUM 9.6    PT/INR: No results for input(s): LABPROT, INR in the last 72 hours. ABG    Component Value Date/Time   PHART 7.394 08/16/2017 0553   HCO3 25.2 08/16/2017 0553   ACIDBASEDEF 0.6 08/13/2017 1502   O2SAT 97.1 08/16/2017 0553   CBG (last 3)  Recent Labs    08/26/17 1203 08/26/17 1718 08/26/17 2130  GLUCAP 104* 128* 103*    Assessment/Plan: S/P Procedure(s) (LRB): RIGHT VIDEO ASSISTED THORACOSCOPY (VATS)/WEDGE AND LOBECTOMY (Right)  1. CV-NSR 80s, BP well controlled.  2. Pulm-tolerating room air with good oxygen saturation. CXR is stable post chest tube removal.   3.  Renal-creatinine 0.88, electrolytes are okay  4. Blood glucose well controlled 5. Continue lovenox for DVT prophylaxis.    Plan: Discharge today.    LOS: 12 days    Elgie Collard 08/27/2017  chest xray with chest tube out ok Plan home today  I have seen and examined Audrey Russell and agree with the above assessment  and plan.  Grace Isaac MD Beeper [phone] Office [phone] 08/27/2017 8:56 AM

## 2017-08-27 NOTE — Care Management Note (Signed)
Case Management Note  Patient Details  Name: Audrey Russell MRN: [id number] Date of Birth: January 02, 1956  Subjective/Objective:                    Action/Plan: Pt discharging home with self care. Pt has hospital f/u, insurance and transportation home.   Expected Discharge Date:  08/27/17               Expected Discharge Plan:  Home/Self Care(PTA Independent from home, pt has PCP and denied barriers with paying for medications)  In-House Referral:     Discharge planning Services  CM Consult  Post Acute Care Choice:    Choice offered to:     DME Arranged:    DME Agency:     HH Arranged:    [place] Agency:     Status of Service:  Completed, signed off  If discussed at [employer], dates discussed:    Additional Comments:  Pollie Friar, RN 08/27/2017, 10:42 AM

## 2017-09-02 ENCOUNTER — Other Ambulatory Visit: Payer: Self-pay | Admitting: Nurse Practitioner

## 2017-09-02 DIAGNOSIS — E782 Mixed hyperlipidemia: Secondary | ICD-10-CM

## 2017-09-08 ENCOUNTER — Other Ambulatory Visit: Payer: Self-pay | Admitting: Thoracic Surgery (Cardiothoracic Vascular Surgery)

## 2017-09-08 DIAGNOSIS — Z902 Acquired absence of lung [part of]: Secondary | ICD-10-CM

## 2017-09-09 ENCOUNTER — Ambulatory Visit: Payer: Self-pay | Admitting: Thoracic Surgery (Cardiothoracic Vascular Surgery)

## 2017-09-16 ENCOUNTER — Other Ambulatory Visit (HOSPITAL_COMMUNITY): Payer: Self-pay | Admitting: Psychiatry

## 2017-09-16 DIAGNOSIS — F339 Major depressive disorder, recurrent, unspecified: Secondary | ICD-10-CM

## 2017-10-14 ENCOUNTER — Other Ambulatory Visit: Payer: Self-pay

## 2017-10-14 ENCOUNTER — Ambulatory Visit
Admission: RE | Admit: 2017-10-14 | Discharge: 2017-10-14 | Disposition: A | Discharge status: Home / Self Care | Payer: PPO | Source: Ambulatory Visit | Attending: Thoracic Surgery (Cardiothoracic Vascular Surgery) | Admitting: Thoracic Surgery (Cardiothoracic Vascular Surgery)

## 2017-10-14 ENCOUNTER — Ambulatory Visit (INDEPENDENT_AMBULATORY_CARE_PROVIDER_SITE_OTHER): Payer: Self-pay | Admitting: Thoracic Surgery (Cardiothoracic Vascular Surgery)

## 2017-10-14 ENCOUNTER — Encounter: Payer: Self-pay | Admitting: Thoracic Surgery (Cardiothoracic Vascular Surgery)

## 2017-10-14 VITALS — BP 128/81 | HR 78 | Resp 18 | Ht 69.0 in | Wt 170.0 lb

## 2017-10-14 DIAGNOSIS — R911 Solitary pulmonary nodule: Secondary | ICD-10-CM

## 2017-10-14 DIAGNOSIS — Z902 Acquired absence of lung [part of]: Secondary | ICD-10-CM

## 2017-10-14 DIAGNOSIS — R0602 Shortness of breath: Secondary | ICD-10-CM | POA: Diagnosis not present

## 2017-10-14 DIAGNOSIS — R918 Other nonspecific abnormal finding of lung field: Secondary | ICD-10-CM

## 2017-10-14 MED ORDER — ALBUTEROL SULFATE HFA 108 (90 BASE) MCG/ACT IN AERS: 2.0000 | INHALATION_SPRAY | Freq: Four times a day (QID) | RESPIRATORY_TRACT | 2 refills | Status: DC | PRN

## 2017-10-14 NOTE — Progress Notes (Signed)
[address]       [place] [address]             [phone]     HPI: Ms. Assad returns for a scheduled postoperative follow-up visit  Audrey Russell is a 62 year old woman with a history of tobacco abuse, COPD, depression, bipolar disorder, fibromyalgia, reflux, anxiety, arthritis, multiple sclerosis, type 2 diabetes, hypertension, and hyperlipidemia.  She was found to have a right upper lobe cavitary mass earlier this year.  She originally presented with a respiratory infection.  Work-up showed a 3.4 x 2.9 cm cavitary lesion in the right upper lobe.  She was tested for TB that was negative.  PET/CT showed the lesion was hypermetabolic.  There was no evidence of adenopathy.  I did a right VATS on 08/15/2017.  Wedge resection showed non-small cell carcinoma.  I did a thoracoscopic right upper lobectomy and node dissection.  Final pathology showed a T1, N0, stage IA adenocarcinoma.  She did well initially and her chest tube was removed and she was about ready to go home when she developed a pneumothorax.  He had to place a pigtail catheter.  Her air leak again resolved and the pigtail was removed.  She was discharged on 08/27/2017.  Since discharge she is been doing well.  She is has some very mild discomfort at the incision.  She is not taking any narcotics.  She has not smoked since prior to surgery.  Past Medical History:  Diagnosis Date  . Anxiety   . Arthritis    "neck, knees, ankles" (08/15/2017)  . Bipolar disorder ([facility])   . Cancer of upper lobe of right lung ([facility]) 08/15/2017  . Chickenpox   . Chronic neck pain   . Chronic sinus infection   . COPD (chronic obstructive pulmonary disease) (H[facility])    emphysema  . Depression   . Fibromyalgia   . GERD (gastroesophageal reflux disease)   . Heart murmur    as a baby  . Hyperlipidemia   . Hypertension   . Migraine 1975  . Multiple sclerosis ([facility]) 1975  . Seasonal allergies   . Type II diabetes mellitus  ([facility])     Current Outpatient Medications  Medication Sig Dispense Refill  . acetaminophen (TYLENOL) 500 MG tablet Take 1,000 mg by mouth every 6 (six) hours as needed for mild pain or moderate pain.    . cetirizine (ZYRTEC) 10 MG tablet TAKE 1 TABLET BY MOUTH EVERYDAY AT BEDTIME 30 tablet 1  . citalopram (CELEXA) 40 MG tablet Take 1 tablet (40 mg total) by mouth daily. 90 tablet 3  . ezetimibe (ZETIA) 10 MG tablet Take 1 tablet (10 mg total) by mouth every evening. 90 tablet 3  . fenofibrate 160 MG tablet Take 1 tablet (160 mg total) by mouth daily. 30 tablet 1  . fluticasone (FLONASE) 50 MCG/ACT nasal spray SPRAY 2 SPRAYS INTO EACH NOSTRIL EVERY DAY 16 g 2  . gabapentin (NEURONTIN) 300 MG capsule Take 1 capsule (300 mg total) by mouth at bedtime. 30 capsule 0  . ketoconazole (NIZORAL) 2 % cream Apply 1 application topically 2 (two) times daily. 15 g 1  . lamoTRIgine (LAMICTAL) 100 MG tablet Take 1 tablet (100 mg total) by mouth daily. 30 tablet 0  . metFORMIN (GLUCOPHAGE-XR) 500 MG 24 hr tablet Take 1 tablet (500 mg total) by mouth daily with breakfast. 90 tablet 1  . mirtazapine (REMERON) 30 MG tablet Take 1 tablet (30 mg  total) by mouth at bedtime. 30 tablet 6  . ranitidine (ZANTAC) 300 MG tablet Take 1 tablet (300 mg total) by mouth at bedtime. 90 tablet 3  . sodium chloride (OCEAN) 0.65 % SOLN nasal spray Place 1 spray into both nostrils as needed for congestion. 15 mL 0  . albuterol (PROVENTIL HFA;VENTOLIN HFA) 108 (90 Base) MCG/ACT inhaler Inhale 2 puffs into the lungs every 6 (six) hours as needed for wheezing or shortness of breath. 1 Inhaler 2   No current facility-administered medications for this visit.     Physical Exam  Diagnostic Tests:   Impression: Ms. Devlin is a 62 year old woman with history of tobacco abuse who was found to have a cavitary right upper lobe nodule.  That turned out to be an adenocarcinoma.  She had a thoracoscopic right upper lobectomy on  08/15/2017.  Postoperatively she had a pneumothorax which required placement of pigtail catheter after her chest tube is been removed.  She went home on postop day #9.  She was originally scheduled to see me about a month ago but missed that appointment.  She feels well.  She says that she is been having some tightness with her breathing over the last week or so.  She feels wheezy at times and has occasional congestion but has not had any productive cough.  She denies fevers or chills.  I am going to give her a prescription for an albuterol inhaler 2 puffs 4 times daily as needed for that.  She has a pathologic stage Ia (T1, N0) adenocarcinoma.  I am going to arrange for her to see Dr.Mohamed from oncology.  She should not need any adjuvant therapy, but will need long-term follow-up.  Also been referred to the pulmonary rehab program.  I think she would benefit from that.  Tobacco abuse-quit smoking prior to surgery.  Has not smoked in over 2 months.  Says she is not having cravings currently.  Plan: Referral to pulmonary rehab Referral to Dr. Julien Nordmann Return in 2 months with PA and lateral chest x-ray to check on progress  Melrose Nakayama, MD [facility] [phone]

## 2017-10-15 NOTE — Addendum Note (Signed)
Addended by: Charlena Cross F on: 10/15/2017 09:48 AM   Modules accepted: Orders

## 2017-10-17 ENCOUNTER — Encounter: Payer: Self-pay | Admitting: Internal Medicine

## 2017-10-26 ENCOUNTER — Other Ambulatory Visit: Payer: Self-pay | Admitting: Physician Assistant

## 2017-11-03 ENCOUNTER — Other Ambulatory Visit: Payer: Self-pay | Admitting: Medical Oncology

## 2017-11-03 DIAGNOSIS — R918 Other nonspecific abnormal finding of lung field: Secondary | ICD-10-CM

## 2017-11-04 ENCOUNTER — Encounter: Payer: Self-pay | Admitting: Internal Medicine

## 2017-11-04 ENCOUNTER — Inpatient Hospital Stay: Payer: PPO | Attending: Internal Medicine | Admitting: Internal Medicine

## 2017-11-04 ENCOUNTER — Telehealth: Payer: Self-pay | Admitting: Internal Medicine

## 2017-11-04 ENCOUNTER — Inpatient Hospital Stay: Payer: PPO

## 2017-11-04 VITALS — BP 139/85 | HR 79 | Temp 99.3°F | Resp 18 | Ht 69.0 in | Wt 171.9 lb

## 2017-11-04 DIAGNOSIS — Z7984 Long term (current) use of oral hypoglycemic drugs: Secondary | ICD-10-CM | POA: Diagnosis not present

## 2017-11-04 DIAGNOSIS — I10 Essential (primary) hypertension: Secondary | ICD-10-CM | POA: Diagnosis not present

## 2017-11-04 DIAGNOSIS — Z803 Family history of malignant neoplasm of breast: Secondary | ICD-10-CM | POA: Insufficient documentation

## 2017-11-04 DIAGNOSIS — Z79899 Other long term (current) drug therapy: Secondary | ICD-10-CM | POA: Insufficient documentation

## 2017-11-04 DIAGNOSIS — F17210 Nicotine dependence, cigarettes, uncomplicated: Secondary | ICD-10-CM

## 2017-11-04 DIAGNOSIS — Z85118 Personal history of other malignant neoplasm of bronchus and lung: Secondary | ICD-10-CM | POA: Insufficient documentation

## 2017-11-04 DIAGNOSIS — C3411 Malignant neoplasm of upper lobe, right bronchus or lung: Secondary | ICD-10-CM | POA: Diagnosis not present

## 2017-11-04 DIAGNOSIS — Z8249 Family history of ischemic heart disease and other diseases of the circulatory system: Secondary | ICD-10-CM | POA: Diagnosis not present

## 2017-11-04 DIAGNOSIS — F329 Major depressive disorder, single episode, unspecified: Secondary | ICD-10-CM | POA: Insufficient documentation

## 2017-11-04 DIAGNOSIS — E119 Type 2 diabetes mellitus without complications: Secondary | ICD-10-CM | POA: Insufficient documentation

## 2017-11-04 DIAGNOSIS — Z87891 Personal history of nicotine dependence: Secondary | ICD-10-CM | POA: Insufficient documentation

## 2017-11-04 DIAGNOSIS — R05 Cough: Secondary | ICD-10-CM | POA: Diagnosis not present

## 2017-11-04 DIAGNOSIS — C3491 Malignant neoplasm of unspecified part of right bronchus or lung: Secondary | ICD-10-CM | POA: Insufficient documentation

## 2017-11-04 DIAGNOSIS — C3490 Malignant neoplasm of unspecified part of unspecified bronchus or lung: Secondary | ICD-10-CM

## 2017-11-04 DIAGNOSIS — Z902 Acquired absence of lung [part of]: Secondary | ICD-10-CM | POA: Diagnosis not present

## 2017-11-04 DIAGNOSIS — R918 Other nonspecific abnormal finding of lung field: Secondary | ICD-10-CM

## 2017-11-04 LAB — CMP (CANCER CENTER ONLY)
ALBUMIN: 3.9 g/dL (ref 3.5–5.0)
ALK PHOS: 82 U/L (ref 38–126)
ALT: 15 U/L (ref 0–44)
ANION GAP: 10 (ref 5–15)
AST: 20 U/L (ref 15–41)
BUN: 7 mg/dL — ABNORMAL LOW (ref 8–23)
CALCIUM: 9.7 mg/dL (ref 8.9–10.3)
CO2: 27 mmol/L (ref 22–32)
Chloride: 105 mmol/L (ref 98–111)
Creatinine: 1.04 mg/dL — ABNORMAL HIGH (ref 0.44–1.00)
GFR, Estimated: 57 mL/min — ABNORMAL LOW (ref >60)
GLUCOSE: 142 mg/dL — AB (ref 70–99)
POTASSIUM: 4.1 mmol/L (ref 3.5–5.1)
SODIUM: 142 mmol/L (ref 135–145)
TOTAL PROTEIN: 7.8 g/dL (ref 6.5–8.1)
Total Bilirubin: 0.3 mg/dL (ref 0.3–1.2)

## 2017-11-04 LAB — CBC WITH DIFFERENTIAL (CANCER CENTER ONLY)
ABS IMMATURE GRANULOCYTES: 0.02 10*3/uL (ref 0.00–0.07)
BASOS PCT: 1 %
Basophils Absolute: 0.1 10*3/uL (ref 0.0–0.1)
EOS ABS: 0.1 10*3/uL (ref 0.0–0.5)
Eosinophils Relative: 1 %
HCT: 41.8 % (ref 36.0–46.0)
Hemoglobin: 13.4 g/dL (ref 12.0–15.0)
IMMATURE GRANULOCYTES: 0 %
Lymphocytes Relative: 32 %
Lymphs Abs: 2.9 10*3/uL (ref 0.7–4.0)
MCH: 27.4 pg (ref 26.0–34.0)
MCHC: 32.1 g/dL (ref 30.0–36.0)
MCV: 85.5 fL (ref 80.0–100.0)
MONOS PCT: 8 %
Monocytes Absolute: 0.8 10*3/uL (ref 0.1–1.0)
NEUTROS ABS: 5.4 10*3/uL (ref 1.7–7.7)
NEUTROS PCT: 58 %
NRBC: 0 % (ref 0.0–0.2)
PLATELETS: 262 10*3/uL (ref 150–400)
RBC: 4.89 MIL/uL (ref 3.87–5.11)
RDW: 13.8 % (ref 11.5–15.5)
WBC Count: 9.3 10*3/uL (ref 4.0–10.5)
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
## 2017-11-04 NOTE — Telephone Encounter (Signed)
Scheduled appt per 10/15 los - sent reminder letter in the mail with appt date and time .

## 2017-11-05 ENCOUNTER — Other Ambulatory Visit: Payer: Self-pay | Admitting: Nurse Practitioner

## 2017-11-05 ENCOUNTER — Ambulatory Visit (INDEPENDENT_AMBULATORY_CARE_PROVIDER_SITE_OTHER): Payer: PPO | Admitting: Nurse Practitioner

## 2017-11-05 ENCOUNTER — Encounter: Payer: Self-pay | Admitting: Nurse Practitioner

## 2017-11-05 ENCOUNTER — Ambulatory Visit (INDEPENDENT_AMBULATORY_CARE_PROVIDER_SITE_OTHER): Payer: PPO

## 2017-11-05 VITALS — BP 118/80 | HR 92 | Temp 99.0°F | Ht 69.0 in | Wt 173.0 lb

## 2017-11-05 DIAGNOSIS — M545 Low back pain, unspecified: Secondary | ICD-10-CM

## 2017-11-05 DIAGNOSIS — E1169 Type 2 diabetes mellitus with other specified complication: Secondary | ICD-10-CM | POA: Diagnosis not present

## 2017-11-05 DIAGNOSIS — J9801 Acute bronchospasm: Secondary | ICD-10-CM

## 2017-11-05 DIAGNOSIS — R918 Other nonspecific abnormal finding of lung field: Secondary | ICD-10-CM

## 2017-11-05 DIAGNOSIS — I10 Essential (primary) hypertension: Secondary | ICD-10-CM

## 2017-11-05 DIAGNOSIS — E781 Pure hyperglyceridemia: Secondary | ICD-10-CM

## 2017-11-05 DIAGNOSIS — H6993 Unspecified Eustachian tube disorder, bilateral: Secondary | ICD-10-CM

## 2017-11-05 DIAGNOSIS — R05 Cough: Secondary | ICD-10-CM | POA: Diagnosis not present

## 2017-11-05 MED ORDER — TRAMADOL-ACETAMINOPHEN 37.5-325 MG PO TABS: 1.0000 | ORAL_TABLET | Freq: Three times a day (TID) | ORAL | 0 refills | Status: AC | PRN

## 2017-11-05 MED ORDER — METHOCARBAMOL 500 MG PO TABS: 500.0000 mg | ORAL_TABLET | Freq: Three times a day (TID) | ORAL | 0 refills | Status: DC | PRN

## 2017-11-05 NOTE — Progress Notes (Signed)
Subjective:  Patient ID: Audrey Russell, female    DOB: 1955-04-17  Age: 62 y.o. MRN: [id number]  CC: Follow-up (follow up on DM and HTN, hyperlipidemia. had surgery in 07/2017 since then patient has been experiencing SOB,coughing  and wheezing. ) and Back Pain (patient is complaing of back pai on lower left side. worsen with movements. flu shot?)  Back Pain  This is a new problem. The current episode started in the past 7 days. The problem is unchanged. The pain is present in the gluteal and lumbar spine. The quality of the pain is described as aching and cramping. The pain does not radiate. The symptoms are aggravated by bending, twisting and sitting. Pertinent negatives include no bladder incontinence, bowel incontinence, dysuria, fever, leg pain, paresis, paresthesias, tingling or weakness. Risk factors include lack of exercise, menopause, poor posture and sedentary lifestyle. She has tried nothing for the symptoms.  onset of back pain after trying to lift an object off the floor.  DM: Controlled with hgbA1c of 7.4 69month ago. Metformin use. Upcoming appt with ophthalmology.  HTN: Controlled without medication. ACE discontinued 06/2017 due to possible cause of bronchospasms. BP Readings from Last 3 Encounters:  11/05/17 118/80  11/04/17 139/85  10/14/17 128/81   Hypertriglyceride: Use of crestor, zetia, and fenofibrate. No myalgia, no ABD pain, no nausea. Did not make appt with lipid clinic. Lipid Panel     Component Value Date/Time   CHOL 282 (H) 01/28/2017 1614   TRIG 597 (H) 01/28/2017 1614   HDL 36 (L) 01/28/2017 1614   CHOLHDL 7.8 (H) 01/28/2017 1614   LDLCALC  01/28/2017 1614     Comment:     . LDL cholesterol not calculated. Triglyceride levels greater than 400 mg/dL invalidate calculated LDL results. . Reference range: <100 . Desirable range <100 mg/dL for primary prevention;   <70 mg/dL for patients with CHD or diabetic patients  with > or = 2 CHD risk  factors. Marland Kitchen LDL-C is now calculated using the Martin-Hopkins  calculation, which is a validated novel method providing  better accuracy than the Friedewald equation in the  estimation of LDL-C.  Cresenciano Genre et al. Annamaria Helling. 5035;465(68): 2061-2068  (http://education.[facility].com/faq/FAQ164)    LDLDIRECT 82.0 08/23/2016 1658   Reviewed past Medical, Social and Family history today.  Outpatient Medications Prior to Visit  Medication Sig Dispense Refill  . acetaminophen (TYLENOL) 500 MG tablet Take 1,000 mg by mouth every 6 (six) hours as needed for mild pain or moderate pain.    Marland Kitchen albuterol (PROVENTIL HFA;VENTOLIN HFA) 108 (90 Base) MCG/ACT inhaler Inhale 2 puffs into the lungs every 6 (six) hours as needed for wheezing or shortness of breath. 1 Inhaler 2  . cetirizine (ZYRTEC) 10 MG tablet TAKE 1 TABLET BY MOUTH EVERYDAY AT BEDTIME 30 tablet 1  . citalopram (CELEXA) 40 MG tablet Take 1 tablet (40 mg total) by mouth daily. 90 tablet 3  . ezetimibe (ZETIA) 10 MG tablet Take 1 tablet (10 mg total) by mouth every evening. 90 tablet 3  . fenofibrate 160 MG tablet Take 1 tablet (160 mg total) by mouth daily. 30 tablet 1  . fluticasone (FLONASE) 50 MCG/ACT nasal spray SPRAY 2 SPRAYS INTO EACH NOSTRIL EVERY DAY 16 g 0  . gabapentin (NEURONTIN) 300 MG capsule Take 1 capsule (300 mg total) by mouth at bedtime. 30 capsule 0  . ketoconazole (NIZORAL) 2 % cream Apply 1 application topically 2 (two) times daily. 15 g 1  . lamoTRIgine (LAMICTAL) 100 MG  tablet Take 1 tablet (100 mg total) by mouth daily. 30 tablet 0  . metFORMIN (GLUCOPHAGE-XR) 500 MG 24 hr tablet Take 1 tablet (500 mg total) by mouth daily with breakfast. 90 tablet 1  . mirtazapine (REMERON) 30 MG tablet Take 1 tablet (30 mg total) by mouth at bedtime. 30 tablet 6  . ranitidine (ZANTAC) 300 MG tablet Take 1 tablet (300 mg total) by mouth at bedtime. 90 tablet 3  . sodium chloride (OCEAN) 0.65 % SOLN nasal spray Place 1 spray into both  nostrils as needed for congestion. 15 mL 0   No facility-administered medications prior to visit.     ROS See HPI  Objective:  BP 118/80   Pulse 92   Temp 99 F (37.2 C) (Oral)   Ht 5\' 9"  (1.753 m)   Wt 173 lb (78.5 kg)   SpO2 98%   BMI 25.55 kg/m   BP Readings from Last 3 Encounters:  11/05/17 118/80  11/04/17 139/85  10/14/17 128/81    Wt Readings from Last 3 Encounters:  11/05/17 173 lb (78.5 kg)  11/04/17 171 lb 14.4 oz (78 kg)  10/14/17 170 lb (77.1 kg)    Physical Exam  Constitutional: She is oriented to person, place, and time. She appears well-developed and well-nourished.  Cardiovascular: Normal rate, regular rhythm, normal heart sounds and intact distal pulses.  Pulmonary/Chest: Effort normal. No stridor. No respiratory distress. She has wheezes. She has rales.  Abdominal: Soft. Bowel sounds are normal. She exhibits no distension. There is no tenderness.  Musculoskeletal: She exhibits tenderness. She exhibits no edema.       Right hip: Normal.       Left hip: Normal.       Lumbar back: She exhibits tenderness, pain and spasm. She exhibits no bony tenderness.  Left lumbar paraspinal muscle tenderness  Neurological: She is alert and oriented to person, place, and time.  Skin: No erythema.  Right lateral chest wall incision: well approximated, dry and intact  Psychiatric: She has a normal mood and affect. Her behavior is normal. Thought content normal.  Vitals reviewed.   Lab Results  Component Value Date   WBC 9.3 11/04/2017   HGB 13.4 11/04/2017   HCT 41.8 11/04/2017   PLT 262 11/04/2017   GLUCOSE 142 (H) 11/04/2017   CHOL 282 (H) 01/28/2017   TRIG 597 (H) 01/28/2017   HDL 36 (L) 01/28/2017   LDLDIRECT 82.0 08/23/2016   [place]  01/28/2017     Comment:     . LDL cholesterol not calculated. Triglyceride levels greater than 400 mg/dL invalidate calculated LDL results. . Reference range: <100 . Desirable range <100 mg/dL for primary prevention;    <70 mg/dL for patients with CHD or diabetic patients  with > or = 2 CHD risk factors. Marland Kitchen LDL-C is now calculated using the Martin-Hopkins  calculation, which is a validated novel method providing  better accuracy than the Friedewald equation in the  estimation of LDL-C.  Cresenciano Genre et al. Annamaria Helling. 6237;628(31): 2061-2068  (http://education.[facility].com/faq/FAQ164)    ALT 15 11/04/2017   AST 20 11/04/2017   NA 142 11/04/2017   K 4.1 11/04/2017   CL 105 11/04/2017   CREATININE 1.04 (H) 11/04/2017   BUN 7 (L) 11/04/2017   CO2 27 11/04/2017   TSH 1.07 06/26/2017   INR 0.96 08/13/2017   HGBA1C 7.4 (H) 08/13/2017   MICROALBUR 0.6 01/28/2017    Dg Chest 2 View  Result Date: 10/14/2017 CLINICAL DATA:  Status  post right VATS with shortness of breath today EXAM: CHEST - 2 VIEW COMPARISON:  08/27/2017 FINDINGS: Cardiac shadow is stable. Aortic calcifications are again identified. Postsurgical changes are noted on the right with mild volume loss similar to that seen on the prior exam. No focal infiltrate or sizable effusion is seen. No bony abnormality is noted. IMPRESSION: Postsurgical changes on the right.  No acute abnormality is noted. Electronically Signed   By: Inez Catalina M.D.   On: 10/14/2017 15:58    Assessment & Plan:   Adryan was seen today for follow-up and back pain.  Diagnoses and all orders for this visit:  Type 2 diabetes mellitus with other specified complication, without long-term current use of insulin ([place]) -     Hemoglobin A1c; Future  Essential hypertension  Hypertriglyceridemia -     Lipid panel; Future  Cough due to bronchospasm -     DG Chest 2 View  Pulmonary infiltrates -     DG Chest 2 View  Acute left-sided low back pain without sciatica -     traMADol-acetaminophen (ULTRACET) 37.5-325 MG tablet; Take 1 tablet by mouth every 8 (eight) hours as needed for up to 3 days. -     methocarbamol (ROBAXIN) 500 MG tablet; Take 1 tablet (500 mg total)  by mouth every 8 (eight) hours as needed for muscle spasms.   I am having [name] start on traMADol-acetaminophen and methocarbamol. I am also having her maintain her acetaminophen, sodium chloride, lamoTRIgine, gabapentin, cetirizine, metFORMIN, mirtazapine, ranitidine, ezetimibe, citalopram, ketoconazole, fenofibrate, albuterol, and fluticasone.  Meds ordered this encounter  Medications  . traMADol-acetaminophen (ULTRACET) 37.5-325 MG tablet    Sig: Take 1 tablet by mouth every 8 (eight) hours as needed for up to 3 days.    Dispense:  10 tablet    Refill:  0    Order Specific Question:   Supervising Provider    Answer:   Lucille Passy [[id number]]  . methocarbamol (ROBAXIN) 500 MG tablet    Sig: Take 1 tablet (500 mg total) by mouth every 8 (eight) hours as needed for muscle spasms.    Dispense:  9 tablet    Refill:  0    Order Specific Question:   Supervising Provider    Answer:   Lucille Passy [[id number]]    Follow-up: Return in about 6 months (around 05/07/2018) for DM and HTN, hyperlipidemia (fasting).  Wilfred Lacy, NP

## 2017-11-05 NOTE — Patient Instructions (Addendum)
Return to lab fasting in 50month for repeat lipid panel and HgbA1c.  No acute finding on CXR. F/up with pulmonology.  Apply cold compress for every 2hrs while awake x 24hrs, the alternated with warm compress. Use tramadol/actaminophen and robaxin for pain.

## 2017-11-06 ENCOUNTER — Encounter: Payer: Self-pay | Admitting: Nurse Practitioner

## 2017-12-16 ENCOUNTER — Ambulatory Visit: Payer: Self-pay | Admitting: Thoracic Surgery (Cardiothoracic Vascular Surgery)

## 2017-12-22 ENCOUNTER — Other Ambulatory Visit: Payer: Self-pay | Admitting: Physician Assistant

## 2017-12-23 DIAGNOSIS — H2513 Age-related nuclear cataract, bilateral: Secondary | ICD-10-CM | POA: Diagnosis not present

## 2017-12-23 DIAGNOSIS — H31091 Other chorioretinal scars, right eye: Secondary | ICD-10-CM | POA: Diagnosis not present

## 2017-12-23 DIAGNOSIS — E119 Type 2 diabetes mellitus without complications: Secondary | ICD-10-CM | POA: Diagnosis not present

## 2017-12-23 DIAGNOSIS — H20011 Primary iridocyclitis, right eye: Secondary | ICD-10-CM | POA: Diagnosis not present

## 2017-12-23 DIAGNOSIS — H02421 Myogenic ptosis of right eyelid: Secondary | ICD-10-CM | POA: Diagnosis not present

## 2017-12-23 LAB — HM DIABETES EYE EXAM

## 2017-12-23 LAB — HM DIABETES FOOT EXAM

## 2017-12-29 ENCOUNTER — Other Ambulatory Visit: Payer: Self-pay | Admitting: Thoracic Surgery (Cardiothoracic Vascular Surgery)

## 2017-12-29 DIAGNOSIS — C3491 Malignant neoplasm of unspecified part of right bronchus or lung: Secondary | ICD-10-CM

## 2017-12-30 ENCOUNTER — Ambulatory Visit: Payer: PPO | Admitting: Thoracic Surgery (Cardiothoracic Vascular Surgery)

## 2017-12-31 ENCOUNTER — Encounter: Payer: Self-pay | Admitting: Thoracic Surgery (Cardiothoracic Vascular Surgery)

## 2018-01-05 ENCOUNTER — Telehealth: Payer: Self-pay | Admitting: Nurse Practitioner

## 2018-01-05 MED ORDER — FENOFIBRATE 160 MG PO TABS: 160.0000 mg | ORAL_TABLET | Freq: Every day | ORAL | 2 refills | Status: DC

## 2018-01-05 MED ORDER — FENOFIBRATE 160 MG PO TABS: 160.0000 mg | ORAL_TABLET | Freq: Every day | ORAL | 0 refills | Status: DC

## 2018-01-05 NOTE — Telephone Encounter (Signed)
Left vm for the pt to call back, this is about cholesterol medications she suppose to be on : Ezetimibe and fenofibrate.

## 2018-01-05 NOTE — Telephone Encounter (Signed)
Pt is aware and we send in 3 mo month of fenofibrate.   Pt also stated she will make an appt with the lipid clinic.

## 2018-01-05 NOTE — Addendum Note (Signed)
Addended byShawnie Pons on: 01/05/2018 02:55 PM   Modules accepted: Orders

## 2018-02-10 ENCOUNTER — Encounter: Payer: Self-pay | Admitting: Nurse Practitioner

## 2018-02-10 ENCOUNTER — Ambulatory Visit (INDEPENDENT_AMBULATORY_CARE_PROVIDER_SITE_OTHER): Payer: PPO | Admitting: Nurse Practitioner

## 2018-02-10 VITALS — BP 118/70 | HR 61 | Temp 98.6°F | Ht 69.0 in | Wt 182.8 lb

## 2018-02-10 DIAGNOSIS — E781 Pure hyperglyceridemia: Secondary | ICD-10-CM | POA: Diagnosis not present

## 2018-02-10 DIAGNOSIS — K5900 Constipation, unspecified: Secondary | ICD-10-CM

## 2018-02-10 DIAGNOSIS — R1013 Epigastric pain: Secondary | ICD-10-CM | POA: Diagnosis not present

## 2018-02-10 DIAGNOSIS — Z23 Encounter for immunization: Secondary | ICD-10-CM | POA: Diagnosis not present

## 2018-02-10 DIAGNOSIS — E1169 Type 2 diabetes mellitus with other specified complication: Secondary | ICD-10-CM | POA: Diagnosis not present

## 2018-02-10 DIAGNOSIS — K769 Liver disease, unspecified: Secondary | ICD-10-CM | POA: Diagnosis not present

## 2018-02-10 DIAGNOSIS — I10 Essential (primary) hypertension: Secondary | ICD-10-CM | POA: Diagnosis not present

## 2018-02-10 MED ORDER — ALUM & MAG HYDROXIDE-SIMETH 400-400-40 MG/5ML PO SUSP: 10.0000 mL | Freq: Four times a day (QID) | ORAL | 0 refills | Status: DC | PRN

## 2018-02-10 MED ORDER — PANTOPRAZOLE SODIUM 20 MG PO TBEC: 20.0000 mg | DELAYED_RELEASE_TABLET | Freq: Two times a day (BID) | ORAL | 0 refills | Status: DC

## 2018-02-10 MED ORDER — DOCUSATE SODIUM 100 MG PO CAPS: 100.0000 mg | ORAL_CAPSULE | Freq: Two times a day (BID) | ORAL | 0 refills | Status: DC | PRN

## 2018-02-10 NOTE — Progress Notes (Signed)
Subjective:  Patient ID: Audrey Russell, female    DOB: [birth year]  Age: 63 y.o. MRN: [id number]  CC: Abdominal Pain (stomach cramping, feels like its on fire/ happening for 4 weeks// rantidine not working/ wants flu shot)   Abdominal Pain  This is a new problem. The current episode started 1 to 4 weeks ago. The onset quality is gradual. The problem occurs constantly. The problem has been gradually worsening. The pain is located in the epigastric region. The quality of the pain is a sensation of fullness and dull. The abdominal pain radiates to the periumbilical region and suprapubic region. Associated symptoms include anorexia, constipation and nausea. Pertinent negatives include no diarrhea, dysuria, fever, flatus, frequency, headaches, hematochezia, hematuria, melena, myalgias, vomiting or weight loss. Nothing aggravates the pain. The pain is relieved by nothing. She has tried H2 blockers and antacids for the symptoms. The treatment provided no relief. Prior diagnostic workup includes lower endoscopy. Her past medical history is significant for GERD. There is no history of colon cancer, gallstones or irritable bowel syndrome.  s/p cholescystectomy. BM every other day, hard.  DM: current use of metformin 500mg , needs eye exam, and pneumoccocal vaccine. No diarrhea.  HTN: BP at goal  Hypertriglyceride: Did not make appt with lipid clinic. Current use of zetia and fenofibrate.  Hepatic Lesion: Incidental finding on 01/01/2018 MRI. Due to repeat MRI.  Reviewed past Medical, Social and Family history today.  Outpatient Medications Prior to Visit  Medication Sig Dispense Refill  . acetaminophen (TYLENOL) 500 MG tablet Take 1,000 mg by mouth every 6 (six) hours as needed for mild pain or moderate pain.    Marland Kitchen albuterol (PROVENTIL HFA;VENTOLIN HFA) 108 (90 Base) MCG/ACT inhaler Inhale 2 puffs into the lungs every 6 (six) hours as needed for wheezing or shortness of breath. 1 Inhaler 2    . cetirizine (ZYRTEC) 10 MG tablet TAKE 1 TABLET BY MOUTH EVERYDAY AT BEDTIME 30 tablet 1  . citalopram (CELEXA) 40 MG tablet Take 1 tablet (40 mg total) by mouth daily. 90 tablet 3  . ezetimibe (ZETIA) 10 MG tablet Take 1 tablet (10 mg total) by mouth every evening. 90 tablet 3  . fluticasone (FLONASE) 50 MCG/ACT nasal spray SPRAY 2 SPRAYS INTO EACH NOSTRIL EVERY DAY 16 g 0  . ketoconazole (NIZORAL) 2 % cream Apply 1 application topically 2 (two) times daily. 15 g 1  . methocarbamol (ROBAXIN) 500 MG tablet Take 1 tablet (500 mg total) by mouth every 8 (eight) hours as needed for muscle spasms. 9 tablet 0  . mirtazapine (REMERON) 30 MG tablet Take 1 tablet (30 mg total) by mouth at bedtime. 30 tablet 6  . ranitidine (ZANTAC) 300 MG tablet Take 1 tablet (300 mg total) by mouth at bedtime. 90 tablet 3  . sodium chloride (OCEAN) 0.65 % SOLN nasal spray Place 1 spray into both nostrils as needed for congestion. 15 mL 0  . fenofibrate 160 MG tablet Take 1 tablet (160 mg total) by mouth daily. 90 tablet 0  . metFORMIN (GLUCOPHAGE-XR) 500 MG 24 hr tablet Take 1 tablet (500 mg total) by mouth daily with breakfast. 90 tablet 1  . gabapentin (NEURONTIN) 300 MG capsule Take 1 capsule (300 mg total) by mouth at bedtime. (Patient not taking: Reported on 02/10/2018) 30 capsule 0  . lamoTRIgine (LAMICTAL) 100 MG tablet Take 1 tablet (100 mg total) by mouth daily. (Patient not taking: Reported on 02/10/2018) 30 tablet 0   No facility-administered medications prior to visit.  ROS See HPI  Objective:  BP 118/70   Pulse 61   Temp 98.6 F (37 C) (Oral)   Ht 5\' 9"  (1.753 m)   Wt 182 lb 12.8 oz (82.9 kg)   SpO2 97%   BMI 26.99 kg/m   BP Readings from Last 3 Encounters:  02/10/18 118/70  11/05/17 118/80  11/04/17 139/85    Wt Readings from Last 3 Encounters:  02/10/18 182 lb 12.8 oz (82.9 kg)  11/05/17 173 lb (78.5 kg)  11/04/17 171 lb 14.4 oz (78 kg)    Physical Exam Cardiovascular:      Rate and Rhythm: Normal rate and regular rhythm.  Pulmonary:     Effort: Pulmonary effort is normal.     Breath sounds: Normal breath sounds.  Abdominal:     General: Bowel sounds are normal.     Palpations: Abdomen is soft.     Tenderness: There is abdominal tenderness in the epigastric area.     Hernia: No hernia is present.  Neurological:     Mental Status: She is alert.     Lab Results  Component Value Date   WBC 9.3 11/04/2017   HGB 13.4 11/04/2017   HCT 41.8 11/04/2017   PLT 262 11/04/2017   GLUCOSE 147 (H) 02/10/2018   CHOL 197 02/10/2018   TRIG 294.0 (H) 02/10/2018   HDL 31.40 (L) 02/10/2018   LDLDIRECT 119.0 02/10/2018   [place]  01/28/2017     Comment:     . LDL cholesterol not calculated. Triglyceride levels greater than 400 mg/dL invalidate calculated LDL results. . Reference range: <100 . Desirable range <100 mg/dL for primary prevention;   <70 mg/dL for patients with CHD or diabetic patients  with > or = 2 CHD risk factors. Marland Kitchen LDL-C is now calculated using the Martin-Hopkins  calculation, which is a validated novel method providing  better accuracy than the Friedewald equation in the  estimation of LDL-C.  Cresenciano Genre et al. Annamaria Helling. 7124;580(99): 2061-2068  (http://education.[facility].com/faq/FAQ164)    ALT 11 02/10/2018   AST 14 02/10/2018   NA 141 02/10/2018   K 4.0 02/10/2018   CL 106 02/10/2018   CREATININE 0.82 02/10/2018   BUN 10 02/10/2018   CO2 28 02/10/2018   TSH 1.07 06/26/2017   INR 0.96 08/13/2017   HGBA1C 8.8 (H) 02/10/2018   MICROALBUR 0.6 01/28/2017   Dg Chest 2 View  Result Date: 10/14/2017 CLINICAL DATA:  Status post right VATS with shortness of breath today EXAM: CHEST - 2 VIEW COMPARISON:  08/27/2017 FINDINGS: Cardiac shadow is stable. Aortic calcifications are again identified. Postsurgical changes are noted on the right with mild volume loss similar to that seen on the prior exam. No focal infiltrate or sizable effusion  is seen. No bony abnormality is noted. IMPRESSION: Postsurgical changes on the right.  No acute abnormality is noted. Electronically Signed   By: Inez Catalina M.D.   On: 10/14/2017 15:58    Assessment & Plan:   Blyss was seen today for abdominal pain.  Diagnoses and all orders for this visit:  Epigastric pain -     Amylase -     Lipase -     pantoprazole (PROTONIX) 20 MG tablet; Take 1 tablet (20 mg total) by mouth 2 (two) times daily before a meal. -     alum & mag hydroxide-simeth (MYLANTA MAXIMUM STRENGTH) 400-400-40 MG/5ML suspension; Take 10 mLs by mouth every 6 (six) hours as needed for indigestion.  Constipation, unspecified constipation type -  docusate sodium (COLACE) 100 MG capsule; Take 1 capsule (100 mg total) by mouth 2 (two) times daily as needed for mild constipation.  Hypertriglyceridemia -     Lipid panel -     Hepatic function panel -     LDL cholesterol, direct -     fenofibrate 160 MG tablet; Take 1 tablet (160 mg total) by mouth daily.  Type 2 diabetes mellitus with other specified complication, without long-term current use of insulin ([facility]) -     Hemoglobin A1c -     metFORMIN (GLUCOPHAGE-XR) 500 MG 24 hr tablet; Take 1 tablet (500 mg total) by mouth 2 (two) times daily between meals.  Essential hypertension -     Basic metabolic panel  Need for influenza vaccination -     Flu vaccine HIGH DOSE PF (Fluzone High dose)  Liver disease -     MR Abdomen W Wo Contrast; Future  Hepatic lesion -     MR Abdomen W Wo Contrast; Future   I have discontinued Anorah Westmoreland's metFORMIN. I am also having her start on pantoprazole, alum & mag hydroxide-simeth, docusate sodium, and metFORMIN. Additionally, I am having her maintain her acetaminophen, sodium chloride, lamoTRIgine, gabapentin, cetirizine, mirtazapine, ranitidine, ezetimibe, citalopram, ketoconazole, albuterol, fluticasone, methocarbamol, and fenofibrate.  Meds ordered this encounter  Medications   . pantoprazole (PROTONIX) 20 MG tablet    Sig: Take 1 tablet (20 mg total) by mouth 2 (two) times daily before a meal.    Dispense:  30 tablet    Refill:  0    Order Specific Question:   Supervising Provider    Answer:   Lucille Passy [[id number]]  . alum & mag hydroxide-simeth (MYLANTA MAXIMUM STRENGTH) 400-400-40 MG/5ML suspension    Sig: Take 10 mLs by mouth every 6 (six) hours as needed for indigestion.    Dispense:  355 mL    Refill:  0    Order Specific Question:   Supervising Provider    Answer:   Lucille Passy [[id number]]  . docusate sodium (COLACE) 100 MG capsule    Sig: Take 1 capsule (100 mg total) by mouth 2 (two) times daily as needed for mild constipation.    Dispense:  10 capsule    Refill:  0    Order Specific Question:   Supervising Provider    Answer:   Lucille Passy [[id number]]  . metFORMIN (GLUCOPHAGE-XR) 500 MG 24 hr tablet    Sig: Take 1 tablet (500 mg total) by mouth 2 (two) times daily between meals.    Dispense:  180 tablet    Refill:  1    Order Specific Question:   Supervising Provider    Answer:   Lucille Passy [[id number]]  . fenofibrate 160 MG tablet    Sig: Take 1 tablet (160 mg total) by mouth daily.    Dispense:  90 tablet    Refill:  3    Order Specific Question:   Supervising Provider    Answer:   Lucille Passy [[id number]]    Problem List Items Addressed This Visit      Cardiovascular and Mediastinum   Essential hypertension   Relevant Medications   fenofibrate 160 MG tablet   Other Relevant Orders   Basic metabolic panel (Completed)     Digestive   Liver disease   Relevant Orders   MR Abdomen W Wo Contrast     Endocrine   DM (diabetes mellitus) ([place])   Relevant Medications  metFORMIN (GLUCOPHAGE-XR) 500 MG 24 hr tablet   Other Relevant Orders   Hemoglobin A1c (Completed)     Other   Abdominal pain - Primary   Relevant Medications   pantoprazole (PROTONIX) 20 MG tablet   alum & mag hydroxide-simeth (MYLANTA MAXIMUM STRENGTH) 400-400-40 MG/5ML  suspension   Other Relevant Orders   Amylase (Completed)   Lipase (Completed)   Hepatic lesion   Relevant Orders   MR Abdomen W Wo Contrast   Hypertriglyceridemia   Relevant Medications   fenofibrate 160 MG tablet   Other Relevant Orders   Lipid panel (Completed)   Hepatic function panel (Completed)   LDL cholesterol, direct (Completed)    Other Visit Diagnoses    Constipation, unspecified constipation type       Relevant Medications   docusate sodium (COLACE) 100 MG capsule   Need for influenza vaccination       Relevant Orders   Flu vaccine HIGH DOSE PF (Fluzone High dose) (Completed)       Follow-up: Return in about 3 months (around 05/12/2018) for DM and HTN, hyperlipidemia.  Wilfred Lacy, NP

## 2018-02-10 NOTE — Patient Instructions (Addendum)
Normal lipase and amylase. Normal liver function. Stable renal function with elevated glucose and hgbA1c of 8.8. this indicates worsening DM. Need to increase metformin to 1000mg  once a day. Lipid panel indicates some improvement, but persistent elevation in triglyceride and LDL. Need to continue zetia and fenofibrate. Need to make appt with lipid clinic. Also entered MRI ABD to re eval liver lesion found on MRI 12/2017.  Start pantoprazole BID. Use mylanta as needed.  Call office if no improvement in 1week.  You will be contacted to schedule appt for repeat MRI.  Food Choices for Gastroesophageal Reflux Disease, Adult When you have gastroesophageal reflux disease (GERD), the foods you eat and your eating habits are very important. Choosing the right foods can help ease your discomfort. Think about working with a nutrition specialist (dietitian) to help you make good choices. What are tips for following this plan?  Meals  Choose healthy foods that are low in fat, such as fruits, vegetables, whole grains, low-fat dairy products, and lean meat, fish, and poultry.  Eat small meals often instead of 3 large meals a day. Eat your meals slowly, and in a place where you are relaxed. Avoid bending over or lying down until 2-3 hours after eating.  Avoid eating meals 2-3 hours before bed.  Avoid drinking a lot of liquid with meals.  Cook foods using methods other than frying. Bake, grill, or broil food instead.  Avoid or limit: ? Chocolate. ? Peppermint or spearmint. ? Alcohol. ? Pepper. ? Black and decaffeinated coffee. ? Black and decaffeinated tea. ? Bubbly (carbonated) soft drinks. ? Caffeinated energy drinks and soft drinks.  Limit high-fat foods such as: ? Fatty meat or fried foods. ? Whole milk, cream, butter, or ice cream. ? Nuts and nut butters. ? Pastries, donuts, and sweets made with butter or shortening.  Avoid foods that cause symptoms. These foods may be different for  everyone. Common foods that cause symptoms include: ? Tomatoes. ? Oranges, lemons, and limes. ? Peppers. ? Spicy food. ? Onions and garlic. ? Vinegar. Lifestyle  Maintain a healthy weight. Ask your doctor what weight is healthy for you. If you need to lose weight, work with your doctor to do so safely.  Exercise for at least 30 minutes for 5 or more days each week, or as told by your doctor.  Wear loose-fitting clothes.  Do not smoke. If you need help quitting, ask your doctor.  Sleep with the head of your bed higher than your feet. Use a wedge under the mattress or blocks under the bed frame to raise the head of the bed. Summary  When you have gastroesophageal reflux disease (GERD), food and lifestyle choices are very important in easing your symptoms.  Eat small meals often instead of 3 large meals a day. Eat your meals slowly, and in a place where you are relaxed.  Limit high-fat foods such as fatty meat or fried foods.  Avoid bending over or lying down until 2-3 hours after eating.  Avoid peppermint and spearmint, caffeine, alcohol, and chocolate. This information is not intended to replace advice given to you by your health care provider. Make sure you discuss any questions you have with your health care provider. Document Released: 07/09/2011 Document Revised: 02/13/2016 Document Reviewed: 02/13/2016 Elsevier Interactive Patient Education  2019 [employer].

## 2018-02-11 DIAGNOSIS — K769 Liver disease, unspecified: Secondary | ICD-10-CM | POA: Insufficient documentation

## 2018-02-11 LAB — BASIC METABOLIC PANEL
BUN: 10 mg/dL (ref 6–23)
CHLORIDE: 106 meq/L (ref 96–112)
CO2: 28 meq/L (ref 19–32)
CREATININE: 0.82 mg/dL (ref 0.40–1.20)
Calcium: 9.6 mg/dL (ref 8.4–10.5)
GFR: 85.45 mL/min (ref >60.00)
Glucose, Bld: 147 mg/dL — ABNORMAL HIGH (ref 70–99)
Potassium: 4 mEq/L (ref 3.5–5.1)
Sodium: 141 mEq/L (ref 135–145)

## 2018-02-11 LAB — HEPATIC FUNCTION PANEL
ALBUMIN: 4.1 g/dL (ref 3.5–5.2)
ALK PHOS: 71 U/L (ref 39–117)
ALT: 11 U/L (ref 0–35)
AST: 14 U/L (ref 0–37)
BILIRUBIN TOTAL: 0.3 mg/dL (ref 0.2–1.2)
Bilirubin, Direct: 0 mg/dL (ref 0.0–0.3)
Total Protein: 7.1 g/dL (ref 6.0–8.3)

## 2018-02-11 LAB — LDL CHOLESTEROL, DIRECT: LDL DIRECT: 119 mg/dL

## 2018-02-11 LAB — HEMOGLOBIN A1C: Hgb A1c MFr Bld: 8.8 % — ABNORMAL HIGH (ref 4.6–6.5)

## 2018-02-11 LAB — LIPID PANEL
CHOL/HDL RATIO: 6
Cholesterol: 197 mg/dL (ref 0–200)
HDL: 31.4 mg/dL — ABNORMAL LOW (ref >39.00)
NONHDL: 165.59
TRIGLYCERIDES: 294 mg/dL — AB (ref 0.0–149.0)
VLDL: 58.8 mg/dL — ABNORMAL HIGH (ref 0.0–40.0)

## 2018-02-11 LAB — AMYLASE: AMYLASE: 40 U/L (ref 27–131)

## 2018-02-11 LAB — LIPASE: Lipase: 19 U/L (ref 11.0–59.0)

## 2018-02-11 MED ORDER — FENOFIBRATE 160 MG PO TABS: 160.0000 mg | ORAL_TABLET | Freq: Every day | ORAL | 3 refills | Status: DC

## 2018-02-11 MED ORDER — METFORMIN HCL ER 500 MG PO TB24: 500.0000 mg | ORAL_TABLET | Freq: Two times a day (BID) | ORAL | 1 refills | Status: DC

## 2018-03-03 ENCOUNTER — Ambulatory Visit
Admission: RE | Admit: 2018-03-03 | Discharge: 2018-03-03 | Disposition: A | Discharge status: Home / Self Care | Payer: PPO | Source: Ambulatory Visit | Attending: Thoracic Surgery (Cardiothoracic Vascular Surgery) | Admitting: Thoracic Surgery (Cardiothoracic Vascular Surgery)

## 2018-03-03 ENCOUNTER — Ambulatory Visit (INDEPENDENT_AMBULATORY_CARE_PROVIDER_SITE_OTHER): Payer: PPO | Admitting: Thoracic Surgery (Cardiothoracic Vascular Surgery)

## 2018-03-03 VITALS — BP 115/60 | HR 90 | Resp 20 | Ht 69.0 in | Wt 183.0 lb

## 2018-03-03 DIAGNOSIS — Z902 Acquired absence of lung [part of]: Secondary | ICD-10-CM

## 2018-03-03 DIAGNOSIS — C3411 Malignant neoplasm of upper lobe, right bronchus or lung: Secondary | ICD-10-CM | POA: Diagnosis not present

## 2018-03-03 DIAGNOSIS — C3491 Malignant neoplasm of unspecified part of right bronchus or lung: Secondary | ICD-10-CM

## 2018-03-03 NOTE — Progress Notes (Signed)
[address]       [place] [address]             [phone]     HPI: Audrey Russell returns for a scheduled six-month follow-up visit  Audrey Russell is a 63 year old woman with a history of tobacco abuse, COPD, depression, bipolar disorder, fibromyalgia, multiple sclerosis, type 2 diabetes, hypertension and hyperlipidemia.  She had a thoracoscopic right upper lobectomy for a stage Ia adenocarcinoma in July 2019.  She saw Dr. Julien Nordmann.  She did not require any adjuvant therapy.  I last saw her in September.  She was doing well at that time.  In the interim since her last visit, she is continued to do well.  She does not have any residual pain from the surgery.  She quit smoking prior to the surgery and is still not smoking at this point.  She says she feels much better than she did when she was still smoking.  Past Medical History:  Diagnosis Date  . Anxiety   . Arthritis    "neck, knees, ankles" (08/15/2017)  . Bipolar disorder ([place])   . Cancer of upper lobe of right lung ([place]) 08/15/2017  . Chickenpox   . Chronic neck pain   . Chronic sinus infection   . COPD (chronic obstructive pulmonary disease) (H[facility])    emphysema  . Depression   . Fibromyalgia   . GERD (gastroesophageal reflux disease)   . Heart murmur    as a baby  . Hyperlipidemia   . Hypertension   . Migraine 1975  . Multiple sclerosis ([place]) 1975  . Seasonal allergies   . Type II diabetes mellitus ([place])     Current Outpatient Medications  Medication Sig Dispense Refill  . acetaminophen (TYLENOL) 500 MG tablet Take 1,000 mg by mouth every 6 (six) hours as needed for mild pain or moderate pain.    Marland Kitchen albuterol (PROVENTIL HFA;VENTOLIN HFA) 108 (90 Base) MCG/ACT inhaler Inhale 2 puffs into the lungs every 6 (six) hours as needed for wheezing or shortness of breath. 1 Inhaler 2  . alum & mag hydroxide-simeth (MYLANTA MAXIMUM STRENGTH) 400-400-40 MG/5ML suspension Take 10 mLs by mouth every 6  (six) hours as needed for indigestion. 355 mL 0  . cetirizine (ZYRTEC) 10 MG tablet TAKE 1 TABLET BY MOUTH EVERYDAY AT BEDTIME 30 tablet 1  . citalopram (CELEXA) 40 MG tablet Take 1 tablet (40 mg total) by mouth daily. 90 tablet 3  . docusate sodium (COLACE) 100 MG capsule Take 1 capsule (100 mg total) by mouth 2 (two) times daily as needed for mild constipation. 10 capsule 0  . ezetimibe (ZETIA) 10 MG tablet Take 1 tablet (10 mg total) by mouth every evening. 90 tablet 3  . fenofibrate 160 MG tablet Take 1 tablet (160 mg total) by mouth daily. 90 tablet 3  . fluticasone (FLONASE) 50 MCG/ACT nasal spray SPRAY 2 SPRAYS INTO EACH NOSTRIL EVERY DAY 16 g 0  . ketoconazole (NIZORAL) 2 % cream Apply 1 application topically 2 (two) times daily. 15 g 1  . metFORMIN (GLUCOPHAGE-XR) 500 MG 24 hr tablet Take 1 tablet (500 mg total) by mouth 2 (two) times daily between meals. 180 tablet 1  . mirtazapine (REMERON) 30 MG tablet Take 1 tablet (30 mg total) by mouth at bedtime. 30 tablet 6  . pantoprazole (PROTONIX) 20 MG tablet Take 1 tablet (20 mg total) by mouth 2 (two) times daily before a meal. 30 tablet  0  . sodium chloride (OCEAN) 0.65 % SOLN nasal spray Place 1 spray into both nostrils as needed for congestion. 15 mL 0   No current facility-administered medications for this visit.     Physical Exam BP 115/60   Pulse 90   Resp 20   Ht 5\' 9"  (1.753 m)   Wt 183 lb (83 kg)   SpO2 96% Comment: RA  BMI 27.45 kg/m  63 year old woman in no acute distress Alert and oriented x3 with no focal deficits No supraclavicular or cervical adenopathy Lungs diminished at right base, otherwise clear Cardiac regular rate and rhythm  Diagnostic Tests: CHEST - 2 VIEW  COMPARISON:  11/05/2017  FINDINGS: Heart size is normal. Volume loss at the RIGHT base is stable in appearance. Lung sutures are identified in the RIGHT hilar region. LEFT lung is clear. There is no edema.  IMPRESSION: Stable postoperative  changes. No evidence for acute abnormality.   Electronically Signed   By: Nolon Nations M.D.   On: 03/03/2018 13:30 I personally reviewed the chest x-ray images and concur with the findings noted above  Impression: Audrey Russell is a 63 year old former smoker who had a thoracoscopic right upper lobectomy for a stage Ia (T1, N0) adenocarcinoma in July 2019.  She is now about 6 months out from surgery with no pain or other residual effects related to it.  Stage Ia disease.  Did not require adjuvant therapy.  Has a follow-up appointment scheduled with Dr. Julien Nordmann in April.  Tobacco abuse- Smoking cessation instruction/counseling given:  commended patient for quitting and reviewed strategies for preventing relapses   Plan: Follow up as scheduled with Dr. Julien Nordmann in April.  I will be happy to see Audrey Russell back at anytime in the future if I can be of any further assistance with her care  Melrose Nakayama, MD [facility] [phone]

## 2018-03-06 ENCOUNTER — Telehealth: Payer: Self-pay

## 2018-03-06 DIAGNOSIS — R1013 Epigastric pain: Secondary | ICD-10-CM

## 2018-03-06 MED ORDER — PANTOPRAZOLE SODIUM 20 MG PO TBEC: 20.0000 mg | DELAYED_RELEASE_TABLET | Freq: Every day | ORAL | 1 refills | Status: DC

## 2018-03-06 NOTE — Telephone Encounter (Signed)
Audrey Russell, Please advise pt requesting pantoprazole 20 mg. Last refilled 02/10/2018 30 tabs BID before meals no refills. Would you like me to refill this?

## 2018-03-06 NOTE — Telephone Encounter (Signed)
Copied from [place] [id number]. Topic: Quick Communication - Rx Refill/Question >> Mar 06, 2018  2:13 PM Jeri Cos wrote: Medication: pantoprazole (PROTONIX) 20 MG tablet  Has the patient contacted their pharmacy? Yes.   (Agent: If no, request that the patient contact the pharmacy for the refill.) (Agent: If yes, when and what did the pharmacy advise?)  Preferred Pharmacy (with phone number or street name): [id number] [place], [place] [phone] (Phone) [phone] (Fax)    Agent: Please be advised that RX refills may take up to 3 business days. We ask that you follow-up with your pharmacy.

## 2018-03-06 NOTE — Telephone Encounter (Signed)
Pt aware of the prescription sent on file and the change of directions.

## 2018-03-06 NOTE — Addendum Note (Signed)
Addended by: Leana Gamer on: 03/06/2018 02:59 PM   Modules accepted: Orders

## 2018-03-09 ENCOUNTER — Telehealth: Payer: Self-pay | Admitting: Nurse Practitioner

## 2018-03-09 ENCOUNTER — Encounter: Payer: Self-pay | Admitting: Nurse Practitioner

## 2018-03-09 NOTE — Telephone Encounter (Signed)
Pt is aware of message. [name] do you want her to take other med since she continues to have epigastric discomfort. Please advise.

## 2018-03-09 NOTE — Telephone Encounter (Signed)
Patient stated she has been experiencing headaches after taking the pantoprazole (PROTONIX) 20 MG tablet medication.  Please advise.

## 2018-03-09 NOTE — Telephone Encounter (Signed)
Left vm for the pt to call back. Need to inform Audrey Russell's message below. [place] for triage nurse to give detail message.

## 2018-03-09 NOTE — Telephone Encounter (Signed)
Pt is aware,

## 2018-03-09 NOTE — Telephone Encounter (Signed)
Stop medication 

## 2018-03-09 NOTE — Telephone Encounter (Signed)
Reviewed physician's note with patient. She will discontinue protonix.  She continues to have epigastric discomfort and wants to know if you will phone in another medication to replace this one.  Pharmacy on file.

## 2018-03-09 NOTE — Telephone Encounter (Signed)
Ok to use famotidine OTC 20mg  BID

## 2018-03-14 ENCOUNTER — Ambulatory Visit
Admission: RE | Admit: 2018-03-14 | Discharge: 2018-03-14 | Disposition: A | Discharge status: Home / Self Care | Payer: PPO | Source: Ambulatory Visit | Attending: Nurse Practitioner | Admitting: Nurse Practitioner

## 2018-03-14 DIAGNOSIS — K769 Liver disease, unspecified: Secondary | ICD-10-CM | POA: Diagnosis not present

## 2018-03-14 MED ORDER — GADOBENATE DIMEGLUMINE 529 MG/ML IV SOLN
17.0000 mL | Freq: Once | INTRAVENOUS | Status: AC | PRN
  Administered 2018-03-14: 17 mL via INTRAVENOUS

## 2018-03-18 ENCOUNTER — Other Ambulatory Visit: Payer: Self-pay | Admitting: Nurse Practitioner

## 2018-03-18 DIAGNOSIS — F339 Major depressive disorder, recurrent, unspecified: Secondary | ICD-10-CM

## 2018-03-18 MED ORDER — MIRTAZAPINE 30 MG PO TABS: 30.0000 mg | ORAL_TABLET | Freq: Every day | ORAL | 1 refills | Status: DC

## 2018-03-24 ENCOUNTER — Telehealth: Payer: Self-pay | Admitting: Nurse Practitioner

## 2018-03-24 DIAGNOSIS — R21 Rash and other nonspecific skin eruption: Secondary | ICD-10-CM

## 2018-03-24 NOTE — Telephone Encounter (Signed)
Copied from [place] [id number]. Topic: Referral - Request for Referral >> Mar 24, 2018 12:02 PM Jeri Cos wrote: Has patient seen PCP for this complaint? Yes.    Referral for which specialty: Dermatologist Preferred provider/office: *** Reason for referral: Cream previously prescribed by Dr. Lorayne Marek has not worked. Area has become inflamed and gotten bigger.

## 2018-03-24 NOTE — Telephone Encounter (Signed)
Pt stated she saw Audrey Russell for this last summer for sore in back of her head and rx for nizoral cream prescribed (told to call back if she need referral). Since then its bigger and looks inflame. Pt was wondering if she can get referral to dermatology? Please advise.

## 2018-03-24 NOTE — Telephone Encounter (Signed)
Called Pt. , she is aware of dermatology referral is completed. Call completed.

## 2018-03-24 NOTE — Telephone Encounter (Signed)
Ok to enter referral

## 2018-04-30 ENCOUNTER — Telehealth: Payer: Self-pay | Admitting: Internal Medicine

## 2018-04-30 NOTE — Telephone Encounter (Signed)
Left voicemail for patient with details to download the webex app and sent the join link to her email. Told her to call us back if she is unable to video chat with [place].

## 2018-05-01 ENCOUNTER — Ambulatory Visit: Payer: Self-pay | Admitting: Nurse Practitioner

## 2018-05-04 ENCOUNTER — Other Ambulatory Visit: Payer: Self-pay

## 2018-05-04 ENCOUNTER — Ambulatory Visit (INDEPENDENT_AMBULATORY_CARE_PROVIDER_SITE_OTHER): Payer: PPO | Admitting: Nurse Practitioner

## 2018-05-04 ENCOUNTER — Ambulatory Visit (HOSPITAL_COMMUNITY)
Admission: RE | Admit: 2018-05-04 | Discharge: 2018-05-04 | Disposition: A | Discharge status: Home / Self Care | Payer: PPO | Source: Ambulatory Visit | Attending: Internal Medicine | Admitting: Internal Medicine

## 2018-05-04 ENCOUNTER — Encounter: Payer: Self-pay | Admitting: Nurse Practitioner

## 2018-05-04 ENCOUNTER — Inpatient Hospital Stay: Payer: PPO | Attending: Internal Medicine

## 2018-05-04 VITALS — Ht 69.0 in

## 2018-05-04 DIAGNOSIS — C3490 Malignant neoplasm of unspecified part of unspecified bronchus or lung: Secondary | ICD-10-CM | POA: Insufficient documentation

## 2018-05-04 DIAGNOSIS — J302 Other seasonal allergic rhinitis: Secondary | ICD-10-CM | POA: Diagnosis not present

## 2018-05-04 DIAGNOSIS — M797 Fibromyalgia: Secondary | ICD-10-CM | POA: Insufficient documentation

## 2018-05-04 DIAGNOSIS — Z902 Acquired absence of lung [part of]: Secondary | ICD-10-CM | POA: Insufficient documentation

## 2018-05-04 DIAGNOSIS — F419 Anxiety disorder, unspecified: Secondary | ICD-10-CM | POA: Insufficient documentation

## 2018-05-04 DIAGNOSIS — Z79899 Other long term (current) drug therapy: Secondary | ICD-10-CM | POA: Insufficient documentation

## 2018-05-04 DIAGNOSIS — R0602 Shortness of breath: Secondary | ICD-10-CM | POA: Diagnosis not present

## 2018-05-04 DIAGNOSIS — I10 Essential (primary) hypertension: Secondary | ICD-10-CM

## 2018-05-04 DIAGNOSIS — R05 Cough: Secondary | ICD-10-CM | POA: Diagnosis not present

## 2018-05-04 DIAGNOSIS — F319 Bipolar disorder, unspecified: Secondary | ICD-10-CM | POA: Diagnosis not present

## 2018-05-04 DIAGNOSIS — Z7984 Long term (current) use of oral hypoglycemic drugs: Secondary | ICD-10-CM | POA: Insufficient documentation

## 2018-05-04 DIAGNOSIS — I2510 Atherosclerotic heart disease of native coronary artery without angina pectoris: Secondary | ICD-10-CM | POA: Insufficient documentation

## 2018-05-04 DIAGNOSIS — E1169 Type 2 diabetes mellitus with other specified complication: Secondary | ICD-10-CM | POA: Diagnosis not present

## 2018-05-04 DIAGNOSIS — E119 Type 2 diabetes mellitus without complications: Secondary | ICD-10-CM | POA: Diagnosis not present

## 2018-05-04 DIAGNOSIS — G35 Multiple sclerosis: Secondary | ICD-10-CM | POA: Insufficient documentation

## 2018-05-04 DIAGNOSIS — E785 Hyperlipidemia, unspecified: Secondary | ICD-10-CM | POA: Diagnosis not present

## 2018-05-04 DIAGNOSIS — J439 Emphysema, unspecified: Secondary | ICD-10-CM | POA: Insufficient documentation

## 2018-05-04 DIAGNOSIS — E781 Pure hyperglyceridemia: Secondary | ICD-10-CM

## 2018-05-04 LAB — CBC WITH DIFFERENTIAL (CANCER CENTER ONLY)
Abs Immature Granulocytes: 0.03 10*3/uL (ref 0.00–0.07)
Basophils Absolute: 0.1 10*3/uL (ref 0.0–0.1)
Basophils Relative: 1 %
Eosinophils Absolute: 0.1 10*3/uL (ref 0.0–0.5)
Eosinophils Relative: 1 %
HCT: 43.8 % (ref 36.0–46.0)
Hemoglobin: 14.2 g/dL (ref 12.0–15.0)
Immature Granulocytes: 0 %
Lymphocytes Relative: 31 %
Lymphs Abs: 2.7 10*3/uL (ref 0.7–4.0)
MCH: 27.6 pg (ref 26.0–34.0)
MCHC: 32.4 g/dL (ref 30.0–36.0)
MCV: 85 fL (ref 80.0–100.0)
Monocytes Absolute: 0.6 10*3/uL (ref 0.1–1.0)
Monocytes Relative: 7 %
Neutro Abs: 5.4 10*3/uL (ref 1.7–7.7)
Neutrophils Relative %: 60 %
Platelet Count: 286 10*3/uL (ref 150–400)
RBC: 5.15 MIL/uL — ABNORMAL HIGH (ref 3.87–5.11)
RDW: 14.5 % (ref 11.5–15.5)
WBC Count: 8.9 10*3/uL (ref 4.0–10.5)
nRBC: 0 % (ref 0.0–0.2)

## 2018-05-04 LAB — CMP (CANCER CENTER ONLY)
ALT: 15 U/L (ref 0–44)
AST: 17 U/L (ref 15–41)
Albumin: 4 g/dL (ref 3.5–5.0)
Alkaline Phosphatase: 75 U/L (ref 38–126)
Anion gap: 10 (ref 5–15)
BUN: 12 mg/dL (ref 8–23)
CO2: 21 mmol/L — ABNORMAL LOW (ref 22–32)
Calcium: 9.5 mg/dL (ref 8.9–10.3)
Chloride: 107 mmol/L (ref 98–111)
Creatinine: 1.01 mg/dL — ABNORMAL HIGH (ref 0.44–1.00)
GFR, Est AFR Am: >60 mL/min (ref >60)
GFR, Estimated: 60 mL/min — ABNORMAL LOW (ref >60)
Glucose, Bld: 188 mg/dL — ABNORMAL HIGH (ref 70–99)
Potassium: 4.2 mmol/L (ref 3.5–5.1)
Sodium: 138 mmol/L (ref 135–145)
Total Bilirubin: 0.2 mg/dL — ABNORMAL LOW (ref 0.3–1.2)
Total Protein: 7.8 g/dL (ref 6.5–8.1)

## 2018-05-04 MED ORDER — IOHEXOL 300 MG/ML  SOLN
75.0000 mL | Freq: Once | INTRAMUSCULAR | Status: AC | PRN
  Administered 2018-05-04: 75 mL via INTRAVENOUS

## 2018-05-04 MED ORDER — BLOOD GLUCOSE MONITOR KIT: PACK | 0 refills | Status: DC

## 2018-05-04 MED ORDER — EZETIMIBE 10 MG PO TABS: 10.0000 mg | ORAL_TABLET | Freq: Every evening | ORAL | 3 refills | Status: DC

## 2018-05-04 MED ORDER — SODIUM CHLORIDE (PF) 0.9 % IJ SOLN
INTRAMUSCULAR | Status: AC
  Filled 2018-05-04: qty 50

## 2018-05-04 NOTE — Progress Notes (Signed)
Virtual Visit via Video Note  I connected with Audrey Russell on 05/04/18 at  3:30 PM EDT by a video enabled telemedicine application and verified that I am speaking with the correct person using two identifiers.   I discussed the limitations of evaluation and management by telemedicine and the availability of in person appointments. The patient expressed understanding and agreed to proceed.  CC: 6 mo fu DM,HTN and hyperlipidemia/refills: Zetia and Flonase. pt mention abd burning sensation--pt has been taking otc Famotidine 20 mg twice daily. FYI--do not check BP or BS at home,no vital sign to share today  History of Present Illness: Epigastric pain: Worse if metformin is taken without food. mild improvement with famotidine. Unable to tolerate pantoprazole (caused headaches) Normal GI imaging S/p cholecystectomy.  DM: Last hgbA1c of 8.8. Current use of metformin BID BP at goal without use of medications. Needs annual urine microalbumin and eye exam BP Readings from Last 3 Encounters:  03/03/18 115/60  02/10/18 118/70  11/05/17 118/80   Hypertriglycemia: Improving with zetia and fenofibrate. Lipid Panel     Component Value Date/Time   CHOL 197 02/10/2018 1519   TRIG 294.0 (H) 02/10/2018 1519   HDL 31.40 (L) 02/10/2018 1519   CHOLHDL 6 02/10/2018 1519   VLDL 58.8 (H) 02/10/2018 1519   LDLCALC  01/28/2017 1614     Comment:     . LDL cholesterol not calculated. Triglyceride levels greater than 400 mg/dL invalidate calculated LDL results. . Reference range: <100 . Desirable range <100 mg/dL for primary prevention;   <70 mg/dL for patients with CHD or diabetic patients  with > or = 2 CHD risk factors. Marland Kitchen LDL-C is now calculated using the Martin-Hopkins  calculation, which is a validated novel method providing  better accuracy than the Friedewald equation in the  estimation of LDL-C.  Cresenciano Genre et al. Annamaria Helling. 0998;338(25): 2061-2068   (http://education.QuestDiagnostics.com/faq/FAQ164)    LDLDIRECT 119.0 02/10/2018 1519   Observations/Objective: Unable to provide any vital signs. Physical Exam  Constitutional: She is oriented to person, place, and time. No distress.  Neck: Normal range of motion. Neck supple.  Respiratory: Effort normal.  Neurological: She is alert and oriented to person, place, and time.   Assessment and Plan: Audrey Russell was seen today for follow-up.  Diagnoses and all orders for this visit:  Essential hypertension  Type 2 diabetes mellitus with other specified complication, without long-term current use of insulin ([place]) -     blood glucose meter kit and supplies KIT; Dispense based on patient and insurance preference. Use before breakfast (E11.9).  Hypertriglyceridemia -     ezetimibe (ZETIA) 10 MG tablet; Take 1 tablet (10 mg total) by mouth every evening.   Follow Up Instructions: F/up in 68month (fasting) Needs lipid panel, urine microalbumin, and HgbA1c. Advised to take metformin with food to minimize GI effects. Check glucose once a day before breakfast. Call office if persistently >250 for more than 1week.   I discussed the assessment and treatment plan with the patient. The patient was provided an opportunity to ask questions and all were answered. The patient agreed with the plan and demonstrated an understanding of the instructions.   The patient was advised to call back or seek an in-person evaluation if the symptoms worsen or if the condition fails to improve as anticipated.  CWilfred Lacy NP

## 2018-05-04 NOTE — Patient Instructions (Signed)
F/up in 60months (fasting) Needs lipid panel, urine microalbumin, and HgbA1c. Advised to take metformin with food to minimize GI effects. Check glucose once a day before breakfast. Call office if persistently >250 for more than 1week.

## 2018-05-06 ENCOUNTER — Encounter: Payer: Self-pay | Admitting: Internal Medicine

## 2018-05-06 ENCOUNTER — Inpatient Hospital Stay (HOSPITAL_BASED_OUTPATIENT_CLINIC_OR_DEPARTMENT_OTHER): Payer: PPO | Admitting: Internal Medicine

## 2018-05-06 DIAGNOSIS — I10 Essential (primary) hypertension: Secondary | ICD-10-CM

## 2018-05-06 DIAGNOSIS — C3490 Malignant neoplasm of unspecified part of unspecified bronchus or lung: Secondary | ICD-10-CM

## 2018-05-06 DIAGNOSIS — E119 Type 2 diabetes mellitus without complications: Secondary | ICD-10-CM

## 2018-05-06 DIAGNOSIS — Z79899 Other long term (current) drug therapy: Secondary | ICD-10-CM | POA: Diagnosis not present

## 2018-05-06 DIAGNOSIS — C3491 Malignant neoplasm of unspecified part of right bronchus or lung: Secondary | ICD-10-CM
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
## 2018-05-07 ENCOUNTER — Telehealth: Payer: Self-pay | Admitting: Internal Medicine

## 2018-05-07 NOTE — Telephone Encounter (Signed)
Tried to reach regarding schedule °

## 2018-05-11 ENCOUNTER — Telehealth: Payer: Self-pay | Admitting: Nurse Practitioner

## 2018-05-11 DIAGNOSIS — I7090 Unspecified atherosclerosis: Secondary | ICD-10-CM

## 2018-05-11 DIAGNOSIS — R0609 Other forms of dyspnea: Principal | ICD-10-CM

## 2018-05-11 DIAGNOSIS — H6993 Unspecified Eustachian tube disorder, bilateral: Secondary | ICD-10-CM

## 2018-05-11 MED ORDER — FLUTICASONE PROPIONATE 50 MCG/ACT NA SUSP: NASAL | 0 refills | Status: DC

## 2018-05-11 NOTE — Telephone Encounter (Signed)
Audrey Russell please advise, ok to referral the pt to Cardiology and Pulmonary for COPD?

## 2018-05-11 NOTE — Telephone Encounter (Signed)
Copied from [place] [id number] [id number]. Topic: Referral - Request for Referral >> May 11, 2018 12:32 PM Blase Mess A wrote: CRM for notification. See Telephone encounter for: 05/11/18. Has patient seen PCP for this complaint? Yes Dr. Emogene Morgan onology is requesting the patient to get [name] to place these referrals *If NO, is insurance requiring patient see PCP for this issue before PCP can refer them? No Referral for which specialty: Cardiology, A doctor for COPD Preferred provider/office: No  Reason for referral: Dr. Emogene Morgan onology is requesting the patient to get [name] to place these referrals

## 2018-05-11 NOTE — Telephone Encounter (Signed)
Cardiology referral entered. Pt aware to call Dr. Melvyn Novas for COPD and rx sent.

## 2018-05-11 NOTE — Telephone Encounter (Signed)
Copied from [place] [phone]. Topic: Quick Communication - Rx Refill/Question >> May 11, 2018 12:29 PM Blase Mess A wrote: Medication: fluticasone (FLONASE) 50 MCG/ACT nasal spray [[id number]] , blood glucose meter kit and supplies KIT [[id number]]   Has the patient contacted their pharmacy? Yes  (Agent: If no, request that the patient contact the pharmacy for the refill.) (Agent: If yes, when and what did the pharmacy advise?)  Preferred Pharmacy (with phone number or street name): [phone](Phone) [phone](Fax)    Agent: Please be advised that RX refills may take up to 3 business days. We ask that you follow-up with your pharmacy.

## 2018-05-11 NOTE — Telephone Encounter (Signed)
Ok to send flonase. She is already a patient of Dr. Melvyn Novas with [facility]. Ok to enter referral to cardiology for dyspnea on exertion and atherosclerosis

## 2018-05-11 NOTE — Telephone Encounter (Signed)
Rx sent 

## 2018-05-11 NOTE — Telephone Encounter (Signed)
Also,ok to send in Flonase?

## 2018-05-14 ENCOUNTER — Other Ambulatory Visit: Payer: Self-pay

## 2018-05-14 ENCOUNTER — Ambulatory Visit (INDEPENDENT_AMBULATORY_CARE_PROVIDER_SITE_OTHER): Payer: PPO | Admitting: Internal Medicine

## 2018-05-14 ENCOUNTER — Telehealth: Payer: Self-pay

## 2018-05-14 DIAGNOSIS — R0609 Other forms of dyspnea: Secondary | ICD-10-CM

## 2018-05-14 DIAGNOSIS — Z87891 Personal history of nicotine dependence: Secondary | ICD-10-CM

## 2018-05-14 DIAGNOSIS — R918 Other nonspecific abnormal finding of lung field: Secondary | ICD-10-CM | POA: Diagnosis not present

## 2018-05-14 MED ORDER — ALBUTEROL SULFATE HFA 108 (90 BASE) MCG/ACT IN AERS: 2.0000 | INHALATION_SPRAY | Freq: Four times a day (QID) | RESPIRATORY_TRACT | 2 refills | Status: DC | PRN

## 2018-05-14 MED ORDER — PANTOPRAZOLE SODIUM 40 MG PO TBEC: 40.0000 mg | DELAYED_RELEASE_TABLET | Freq: Every day | ORAL | 2 refills | Status: DC

## 2018-05-14 MED ORDER — FAMOTIDINE 20 MG PO TABS: ORAL_TABLET | ORAL | 11 refills | Status: DC

## 2018-05-14 NOTE — Patient Instructions (Signed)
Pantoprazole (protonix) 40 mg   Take  30-60 min before first meal of the day and Pepcid (famotidine)  20 mg one after supper until return to office - this is the best way to tell whether stomach acid is contributing to your problem.     GERD (REFLUX)  is an extremely common cause of respiratory symptoms just like yours , many times with no obvious heartburn at all.    It can be treated with medication, but also with lifestyle changes including elevation of the head of your bed (ideally with 6 -8inch blocks under the headboard of your bed),  Smoking cessation, avoidance of late meals, excessive alcohol, and avoid fatty foods, chocolate, peppermint, colas, red wine, and acidic juices such as orange juice.  NO MINT OR MENTHOL PRODUCTS SO NO COUGH DROPS  USE SUGARLESS CANDY INSTEAD (Jolley ranchers or Stover's or Life Savers) or even ice chips will also do - the key is to swallow to prevent all throat clearing. NO OIL BASED VITAMINS - use powdered substitutes.  Avoid fish oil when coughing.   Only use your albuterol as a rescue medication to be used if you can't catch your breath by resting or doing a relaxed purse lip breathing pattern.  - The less you use it, the better it will work when you need it. - Ok to use up to 2 puffs  every 4 hours if you must but call for immediate appointment if use goes up over your usual need - Don't leave home without it !!  (think of it like the spare tire for your car)    Please schedule a follow up office visit in 4 weeks, sooner if needed  with all medications /inhalers/ solutions in hand so we can verify exactly what you are taking. This includes all medications from all doctors and over the counters

## 2018-05-14 NOTE — Progress Notes (Signed)
Subjective:     Patient ID: [place], female   DOB: 1955-02-22,     MRN: [id number]  HPI  40yobf from [place] smoking July 2019   [place]oved to [place] with cough/doe x fall   2017referred to pulmonary clinic 06/26/2017 by Dr   [place] with wt loss, night sweats and cavitary lesion in RUL pos segment.    06/26/2017 1st [place] [facility] office visit/ [facility]   Chief Complaint  Patient presents with  . Pulmonary Consult    Referred by Dr. Wilfred Russell for eval of abnormal ct chest done 06/12/17.  She states she has been having SOB for the past 1-2 yrs. She states she "coughs constantly"- started back in Sept 2018 but has been getting progressively worse. Her cough is occ prod with clear to yellow sputum.    initially started with cough fall 2017 thick yellow / green phlegm no better with abx and never bloody worse in early am and up to a small cup in vol in am reported   Doe= MMRC3 = can't walk 100 yards even at a slow pace at a flat grade s stopping due to sob   rec Stop lisinopril  The key is to stop smoking completely before smoking completely stops you!  Bring mucus in am to lab for tb testing tomorrow (best to let someone else drive it over as soon as you can in am)     06/26/2017 Quant GOLD TB > neg 06/27/2017 sputum for afb > neg smear PET 07/17/2017 Low-grade but abnormal activity associated with the solid portion of the cavitary lesion in the right upper lobe, maximum SUV 3.3, suspicious for malignancy. -  pfts 07/22/17 >  Nl x dlco 37 corrects to 50 > 07/23/2017 refer to T surgery > thoracoscopic right upper lobectomy and node dissection.  Final pathology showed a T1, N0, stage IA adenocarcinoma   Virtual Visit via Telephone Note 05/14/2018 re sob p RULobectomy  I connected with [place] on 05/14/18 at 12:00 PM EDT by telephone and verified that I am speaking with the correct person using two identifiers.   I discussed the limitations, risks, security and  privacy concerns of performing an evaluation and management service by telephone and the availability of in person appointments. I also discussed with the patient that there may be a patient responsible charge related to this service. The patient expressed understanding and agreed to proceed.   History of Present Illness: Dyspnea:  Gradually worse dating back to post op to point [facility] = can't walk a nl pace on a flat grade s sob but does fine slow and flat    Cough: more than usual/ mostly dry  Sleeping: worse cough at  hs  SABA use: saba helps some 02: none    No obvious day to day or daytime variability or assoc excess/ purulent sputum or mucus plugs or hemoptysis or cp or chest tightness, subjective wheeze or overt sinus or hb symptoms.   Also denies any obvious fluctuation of symptoms with weather or environmental changes or other aggravating or alleviating factors except as outlined above.   Meds reviewed/ med reconciliation completed        Observations/Objective:   Assessment and Plan: See problem list for active a/p's   Follow Up Instructions: See avs for instructions unique to this ov which includes revised/ updated med list     I discussed the assessment and treatment plan with the patient. The patient was provided  an opportunity to ask questions and all were answered. The patient agreed with the plan and demonstrated an understanding of the instructions.   The patient was advised to call back or seek an in-person evaluation if the symptoms worsen or if the condition fails to improve as anticipated.  I provided 25 minutes of non-face-to-face time during this encounter.   Christinia Gully, MD

## 2018-05-14 NOTE — Telephone Encounter (Signed)
Called and got patients verbal consent to video virtual visit with DR Harrington Challenger 05/15/2018 at [facility]  This patient has been deemed a candidate for follow-up tele-health visit to limit community exposure during the Covid-19 pandemic. I spoke with the patient via phone to discuss instructions. This has been outlined on the patient's AVS (dotphrase: hcevisitinfo). The patient was advised to review the section on consent for treatment as well. The patient will receive a phone call 2-3 days prior to their E-Visit at which time consent will be verbally confirmed.   A Virtual Office Visit appointment type has been scheduled for 05/15/2018 at 9:00am with Dr Harrington Challenger, with "VIDEO" or "TELEPHONE" in the appointment notes - patient prefers VIDEO type.  I have either confirmed the patient is active in MyChart or offered to send sign-up link to phone/email via Mychart icon beside patient's photo. ACTIVE  [place], [facility] 05/14/2018 4:23 PM      Virtual Visit Pre-Appointment Phone Call  "(Name), I am calling you today to discuss your upcoming appointment. We are currently trying to limit exposure to the virus that causes COVID-19 by seeing patients at home rather than in the office."  1. "What is the BEST phone number to call the day of the visit?" - include this in appointment notes  2. "Do you have or have access to (through a family member/friend) a smartphone with video capability that we can use for your visit?" a. If yes - list this number in appt notes as "cell" (if different from BEST phone #) and list the appointment type as a VIDEO visit in appointment notes b. If no - list the appointment type as a PHONE visit in appointment notes  3. Confirm consent - "In the setting of the current Covid19 crisis, you are scheduled for a (phone or video) visit with your provider on (date) at (time).  Just as we do with many in-office visits, in order for you to participate in this visit, we must  obtain consent.  If you'd like, I can send this to your mychart (if signed up) or email for you to review.  Otherwise, I can obtain your verbal consent now.  All virtual visits are billed to your insurance company just like a normal visit would be.  By agreeing to a virtual visit, we'd like you to understand that the technology does not allow for your provider to perform an examination, and thus may limit your provider's ability to fully assess your condition. If your provider identifies any concerns that need to be evaluated in person, we will make arrangements to do so.  Finally, though the technology is pretty good, we cannot assure that it will always work on either your or our end, and in the setting of a video visit, we may have to convert it to a phone-only visit.  In either situation, we cannot ensure that we have a secure connection.  Are you willing to proceed?" STAFF: Did the patient verbally acknowledge consent to telehealth visit? Document YES/NO here:  YES  4. Advise patient to be prepared - "Two hours prior to your appointment, go ahead and check your blood pressure, pulse, oxygen saturation, and your weight (if you have the equipment to check those) and write them all down. When your visit starts, your provider will ask you for this information. If you have an Apple Watch or Kardia device, please plan to have heart rate information ready on the day of your  appointment. Please have a pen and paper handy nearby the day of the visit as well."  5. Give patient instructions for MyChart download to smartphone OR Doximity/Doxy.me as below if video visit (depending on what platform provider is using)  6. Inform patient they will receive a phone call 15 minutes prior to their appointment time (may be from unknown caller ID) so they should be prepared to answer    TELEPHONE CALL NOTE  [place] has been deemed a candidate for a follow-up tele-health visit to limit community exposure during  the Covid-19 pandemic. I spoke with the patient via phone to ensure availability of phone/video source, confirm preferred email & phone number, and discuss instructions and expectations.  I reminded [place] to be prepared with any vital sign and/or heart rhythm information that could potentially be obtained via home monitoring, at the time of her visit. I reminded [place] to expect a phone call prior to her visit.  [name], [place] 05/14/2018 4:23 PM   INSTRUCTIONS FOR DOWNLOADING THE MYCHART APP TO SMARTPHONE  - The patient must first make sure to have activated MyChart and know their login information - If Apple, go to [employer] and type in MyChart in the search bar and download the app. If Android, ask patient to go to [employer] and type in [place] in the search bar and download the app. The app is free but as with any other app downloads, their phone may require them to verify saved payment information or Apple/Android password.  - The patient will need to then log into the app with their MyChart username and password, and select [facility] as their healthcare provider to link the account. When it is time for your visit, go to the MyChart app, find appointments, and click Begin Video Visit. Be sure to Select Allow for your device to access the Microphone and Camera for your visit. You will then be connected, and your provider will be with you shortly.  **If they have any issues connecting, or need assistance please contact MyChart service desk [phone])**  **If using a computer, in order to ensure the best quality for their visit they will need to use either of the following Internet Browsers: [employer], or Google Chrome**  IF USING DOXIMITY or DOXY.ME - The patient will receive a link just prior to their visit by text.     FULL LENGTH CONSENT FOR TELE-HEALTH VISIT   I hereby voluntarily request, consent and authorize [facility] and  its employed or contracted physicians, physician assistants, nurse practitioners or other licensed health care professionals (the Practitioner), to provide me with telemedicine health care services (the "Services") as deemed necessary by the treating Practitioner. I acknowledge and consent to receive the Services by the Practitioner via telemedicine. I understand that the telemedicine visit will involve communicating with the Practitioner through live audiovisual communication technology and the disclosure of certain medical information by electronic transmission. I acknowledge that I have been given the opportunity to request an in-person assessment or other available alternative prior to the telemedicine visit and am voluntarily participating in the telemedicine visit.  I understand that I have the right to withhold or withdraw my consent to the use of telemedicine in the course of my care at any time, without affecting my right to future care or treatment, and that the Practitioner or I may terminate the telemedicine visit at any time. I understand that I have the right to inspect all  information obtained and/or recorded in the course of the telemedicine visit and may receive copies of available information for a reasonable fee.  I understand that some of the potential risks of receiving the Services via telemedicine include:  Marland Kitchen Delay or interruption in medical evaluation due to technological equipment failure or disruption; . Information transmitted may not be sufficient (e.g. poor resolution of images) to allow for appropriate medical decision making by the Practitioner; and/or  . In rare instances, security protocols could fail, causing a breach of personal health information.  Furthermore, I acknowledge that it is my responsibility to provide information about my medical history, conditions and care that is complete and accurate to the best of my ability. I acknowledge that Practitioner's advice,  recommendations, and/or decision may be based on factors not within their control, such as incomplete or inaccurate data provided by me or distortions of diagnostic images or specimens that may result from electronic transmissions. I understand that the practice of medicine is not an exact science and that Practitioner makes no warranties or guarantees regarding treatment outcomes. I acknowledge that I will receive a copy of this consent concurrently upon execution via email to the email address I last provided but may also request a printed copy by calling the office of [facility].    I understand that my insurance will be billed for this visit.   I have read or had this consent read to me. . I understand the contents of this consent, which adequately explains the benefits and risks of the Services being provided via telemedicine.  . I have been provided ample opportunity to ask questions regarding this consent and the Services and have had my questions answered to my satisfaction. . I give my informed consent for the services to be provided through the use of telemedicine in my medical care  By participating in this telemedicine visit I agree to the above.

## 2018-05-15 ENCOUNTER — Other Ambulatory Visit: Payer: Self-pay

## 2018-05-15 ENCOUNTER — Telehealth (INDEPENDENT_AMBULATORY_CARE_PROVIDER_SITE_OTHER): Payer: PPO | Admitting: Internal Medicine

## 2018-05-15 ENCOUNTER — Encounter: Payer: Self-pay | Admitting: Internal Medicine

## 2018-05-15 VITALS — Ht 69.0 in

## 2018-05-15 DIAGNOSIS — R0600 Dyspnea, unspecified: Secondary | ICD-10-CM

## 2018-05-15 DIAGNOSIS — I10 Essential (primary) hypertension: Secondary | ICD-10-CM

## 2018-05-15 DIAGNOSIS — G35 Multiple sclerosis: Secondary | ICD-10-CM

## 2018-05-15 DIAGNOSIS — I2510 Atherosclerotic heart disease of native coronary artery without angina pectoris: Secondary | ICD-10-CM | POA: Diagnosis not present

## 2018-05-15 DIAGNOSIS — E782 Mixed hyperlipidemia: Secondary | ICD-10-CM

## 2018-05-15 DIAGNOSIS — C3491 Malignant neoplasm of unspecified part of right bronchus or lung: Secondary | ICD-10-CM

## 2018-05-15 MED ORDER — ASPIRIN EC 81 MG PO TBEC: 81.0000 mg | DELAYED_RELEASE_TABLET | Freq: Every day | ORAL | 3 refills | Status: AC

## 2018-05-15 MED ORDER — ROSUVASTATIN CALCIUM 10 MG PO TABS: 10.0000 mg | ORAL_TABLET | Freq: Every day | ORAL | 3 refills | Status: DC

## 2018-05-15 NOTE — Patient Instructions (Addendum)
Medication Instructions:  Your physician has recommended you make the following change in your medication:  1.) start aspirin 81 mg (EC) once a day  -enteric coated not chewable 2.) start rosuvastatin (Crestor) 10 mg once a day  If you need a refill on your cardiac medications before your next appointment, please call your pharmacy.   Lab work: In July we will check your lab work (cbc, lipids, ast, HgA1C) We have scheduled this for August 05, 2018.  Come between 7:30 am and 4:30 pm  If you have labs (blood work) drawn today and your tests are completely normal, you will receive your results only by: Marland Kitchen MyChart Message (if you have MyChart) OR . A paper copy in the mail If you have any lab test that is abnormal or we need to change your treatment, we will call you to review the results.  Testing/Procedures: None at this time  Follow-Up: Dr. Harrington Challenger recommends a follow up appointment in 4-5 months.  We have scheduled this for Sep 21, 2018 at 8:00 am Any Other Special Instructions Will Be Listed Below (If Applicable). Dr. Harrington Challenger is referring you to [facility] - Dr. Benjiman Core.  --their office will contact you to arrange appointment.
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
## 2018-05-16 ENCOUNTER — Encounter: Payer: Self-pay | Admitting: Internal Medicine

## 2018-05-16 NOTE — Assessment & Plan Note (Signed)
06/26/2017 Quant GOLD TB > neg 06/27/2017 sputum for afb > neg smear PET 07/17/2017 Low-grade but abnormal activity associated with the solid portion of the cavitary lesion in the right upper lobe, maximum SUV 3.3, suspicious for malignancy. -  pfts 07/22/17 >  Nl x dlco 37 corrects to 50 > 07/23/2017 refer to T surgery > thoracoscopic right upper lobectomy and node dissection.  Final pathology showed a T1, N0, stage IA adenocarcinoma   Not requiring additional rx at this point > observation only per oncology's last note 64/15/2020

## 2018-05-16 NOTE — Assessment & Plan Note (Addendum)
Quit smoking July 2019 -  pfts 07/22/17 >  Nl x dlco 37 corrects to 50 -  Worse doe immediately p RULobectomy 07/2017 some better with saba/ worse at hs with cough  - 05/14/2018 rec max rx for gerd and f/u  walking sats  And then full pfts when COVID - 19 restrictions have been lifted.    When respiratory symptoms begin or become refractory well after a patient reports complete smoking cessation,  Especially when this wasn't the case while they were smoking, a red flag is raised based on the work of Dr Kris Mouton which states:  if you quit smoking when your best day FEV1 is still well preserved it is highly unlikely you will progress to severe disease.  That is to say, once the smoking stops,  the symptoms should not suddenly erupt or markedly worsen.  If so, the differential diagnosis should include  obesity/deconditioning,  LPR/Reflux/Aspiration syndromes,  occult CHF, or  especially side effect of medications commonly used in this population.  The assoc with cough is most typical of Upper airway cough syndrome (previously labeled PNDS),  is so named because it's frequently impossible to sort out how much is  CR/sinusitis with freq throat clearing (which can be related to primary GERD)   vs  causing  secondary (" extra esophageal")  GERD from wide swings in gastric pressure that occur with throat clearing, often  promoting self use of mint and menthol lozenges that reduce the lower esophageal sphincter tone and exacerbate the problem further in a cyclical fashion.   These are the same pts (now being labeled as having "irritable larynx syndrome" by some cough centers) who not infrequently have a history of having failed to tolerate ace inhibitors (as is the case here) ,  dry powder inhalers or biphosphonates or report having atypical/extraesophageal reflux symptoms that don't respond to standard doses of PPI  and are easily confused as having aecopd or asthma flares by even experienced allergists/  pulmonologists (myself included).  (she doe not likely have much copd and asthma should not have flared p quit smoking)      Until able to return to ov needs to try max rx for gerd then return for f/u.   Discussed in detail all the  indications, usual  risks and alternatives  relative to the benefits with patient who agrees to proceed with w/u as outlined.     Each maintenance medication was reviewed in detail including most importantly the difference between maintenance and as needed and under what circumstances the prns are to be used.  Please see AVS for specific  Instructions which are unique to this visit and I personally typed out  which were reviewed in detail over the phone with the patient and a copy provided.

## 2018-05-18 DIAGNOSIS — L659 Nonscarring hair loss, unspecified: Secondary | ICD-10-CM | POA: Diagnosis not present

## 2018-05-18 DIAGNOSIS — L308 Other specified dermatitis: Secondary | ICD-10-CM | POA: Diagnosis not present

## 2018-06-12 ENCOUNTER — Ambulatory Visit: Payer: Self-pay | Admitting: Internal Medicine

## 2018-07-06 ENCOUNTER — Ambulatory Visit: Payer: PPO | Admitting: Internal Medicine

## 2018-07-11 ENCOUNTER — Other Ambulatory Visit: Payer: Self-pay | Admitting: Nurse Practitioner

## 2018-07-11 DIAGNOSIS — H6993 Unspecified Eustachian tube disorder, bilateral: Secondary | ICD-10-CM

## 2018-08-05 ENCOUNTER — Other Ambulatory Visit: Payer: PPO

## 2018-08-07 ENCOUNTER — Other Ambulatory Visit: Payer: Self-pay | Admitting: Internal Medicine

## 2018-08-10 ENCOUNTER — Ambulatory Visit: Payer: Self-pay | Admitting: Nurse Practitioner

## 2018-08-11 ENCOUNTER — Encounter: Payer: Self-pay | Admitting: Nurse Practitioner

## 2018-08-11 ENCOUNTER — Ambulatory Visit (INDEPENDENT_AMBULATORY_CARE_PROVIDER_SITE_OTHER): Payer: PPO | Admitting: Nurse Practitioner

## 2018-08-11 VITALS — BP 110/70 | HR 78 | Temp 97.8°F | Ht 69.0 in | Wt 172.8 lb

## 2018-08-11 DIAGNOSIS — F325 Major depressive disorder, single episode, in full remission: Secondary | ICD-10-CM

## 2018-08-11 DIAGNOSIS — B351 Tinea unguium: Secondary | ICD-10-CM | POA: Insufficient documentation

## 2018-08-11 DIAGNOSIS — I10 Essential (primary) hypertension: Secondary | ICD-10-CM

## 2018-08-11 DIAGNOSIS — B353 Tinea pedis: Secondary | ICD-10-CM | POA: Diagnosis not present

## 2018-08-11 DIAGNOSIS — E1165 Type 2 diabetes mellitus with hyperglycemia: Secondary | ICD-10-CM

## 2018-08-11 DIAGNOSIS — E781 Pure hyperglyceridemia: Secondary | ICD-10-CM

## 2018-08-11 DIAGNOSIS — E1169 Type 2 diabetes mellitus with other specified complication: Secondary | ICD-10-CM | POA: Diagnosis not present

## 2018-08-11 MED ORDER — MICONAZOLE NITRATE 2 % EX POWD: Freq: Two times a day (BID) | CUTANEOUS | 3 refills | Status: DC

## 2018-08-11 NOTE — Patient Instructions (Addendum)
Unable to prescribe oral lamisil for fungal infection of toes at this time for to possible adverse interaction with cholesterol medication. Use miconazole powder as prescribed and daily soak in vinegar and water.   You have upcoming appt with Dr. Harrington Challenger 09/21/2018 at 8am.  No change in hgbA1c, therefore uncontrolled DM. I added jardiance. Continue metformin.  Persistent elevated triglyceride and low HDL. Continue f/up with Dr. Harrington Challenger. Normal TSH, urine microalbumin, stable cbc, and hepatic function. Start glucose check once a day in AM (fasting) F/up in 18month with glucose readings

## 2018-08-11 NOTE — Progress Notes (Signed)
Subjective:  Patient ID: Audrey Russell, female    DOB: 1955/08/15  Age: 63 y.o. MRN: [id number]  CC: Follow-up (f/u HTN and hyperliidemia- fasting)  HPI HTN: Stable BP Followed by cardiology for CAD and hypertriglyceridemia. BP Readings from Last 3 Encounters:  08/11/18 110/70  03/03/18 115/60  02/10/18 118/70   DM: Not controlled with metformin hgbA1c of 8.8 Negative urine microalbumin. Needs to schedule eye exam. Up to date with dental cleaning. Denies any neuropathy or claudication. Former smoker, quit 2019.  Hypertriglyceridemia: Not at goal with zetia and fenofibrate. Followed by Dr. Harrington Challenger: she added crestor. Lipid Panel     Component Value Date/Time   CHOL 137 08/11/2018 1416   TRIG 223 (H) 08/11/2018 1416   HDL 30 (L) 08/11/2018 1416   CHOLHDL 4.6 08/11/2018 1416   VLDL 58.8 (H) 02/10/2018 1519   LDLCALC 76 08/11/2018 1416   LDLDIRECT 119.0 02/10/2018 1519   Reviewed past Medical, Social and Family history today.  Outpatient Medications Prior to Visit  Medication Sig Dispense Refill   acetaminophen (TYLENOL) 500 MG tablet Take 1,000 mg by mouth every 6 (six) hours as needed for mild pain or moderate pain.     aspirin EC 81 MG tablet Take 1 tablet (81 mg total) by mouth daily. 90 tablet 3   blood glucose meter kit and supplies KIT Dispense based on patient and insurance preference. Use before breakfast (E11.9). 1 each 0   cetirizine (ZYRTEC) 10 MG tablet TAKE 1 TABLET BY MOUTH EVERYDAY AT BEDTIME 30 tablet 1   ezetimibe (ZETIA) 10 MG tablet Take 1 tablet (10 mg total) by mouth every evening. 90 tablet 3   fenofibrate 160 MG tablet Take 1 tablet (160 mg total) by mouth daily. 90 tablet 3   fluticasone (FLONASE) 50 MCG/ACT nasal spray SPRAY 2 SPRAYS INTO EACH NOSTRIL EVERY DAY 16 mL 0   PROAIR HFA 108 (90 Base) MCG/ACT inhaler TAKE 2 PUFFS BY MOUTH EVERY 6 HOURS AS NEEDED FOR WHEEZE OR SHORTNESS OF BREATH 8.5 g 5   rosuvastatin (CRESTOR) 10 MG  tablet Take 1 tablet (10 mg total) by mouth daily. 90 tablet 3   sodium chloride (OCEAN) 0.65 % SOLN nasal spray Place 1 spray into both nostrils as needed for congestion. 15 mL 0   citalopram (CELEXA) 40 MG tablet Take 1 tablet (40 mg total) by mouth daily. 90 tablet 3   ketoconazole (NIZORAL) 2 % cream Apply 1 application topically 2 (two) times daily. 15 g 1   metFORMIN (GLUCOPHAGE-XR) 500 MG 24 hr tablet Take 1 tablet (500 mg total) by mouth 2 (two) times daily between meals. 180 tablet 1   mirtazapine (REMERON) 30 MG tablet Take 1 tablet (30 mg total) by mouth at bedtime. 90 tablet 1   No facility-administered medications prior to visit.     ROS See HPI  Objective:  BP 110/70    Pulse 78    Temp 97.8 F (36.6 C) (Oral)    Ht '5\' 9"'$  (1.753 m)    Wt 172 lb 12.8 oz (78.4 kg)    SpO2 93%    BMI 25.52 kg/m   BP Readings from Last 3 Encounters:  08/11/18 110/70  03/03/18 115/60  02/10/18 118/70    Wt Readings from Last 3 Encounters:  08/11/18 172 lb 12.8 oz (78.4 kg)  03/03/18 183 lb (83 kg)  02/10/18 182 lb 12.8 oz (82.9 kg)   Physical Exam Vitals signs reviewed.  Constitutional:  General: She is not in acute distress. Neck:     Musculoskeletal: Normal range of motion and neck supple.     Thyroid: No thyroid mass, thyromegaly or thyroid tenderness.  Cardiovascular:     Rate and Rhythm: Normal rate and regular rhythm.     Pulses: Normal pulses.     Heart sounds: Normal heart sounds.  Pulmonary:     Effort: Pulmonary effort is normal.     Breath sounds: Normal breath sounds.  Musculoskeletal:     Right lower leg: No edema.     Left lower leg: No edema.  Lymphadenopathy:     Cervical: No cervical adenopathy.  Neurological:     Mental Status: She is alert and oriented to person, place, and time.  Psychiatric:        Mood and Affect: Mood normal.        Behavior: Behavior normal.        Thought Content: Thought content normal.     Lab Results  Component  Value Date   WBC 10.0 08/11/2018   HGB 15.2 08/11/2018   HCT 46.3 (H) 08/11/2018   PLT 358 08/11/2018   GLUCOSE 159 (H) 08/11/2018   CHOL 137 08/11/2018   TRIG 223 (H) 08/11/2018   HDL 30 (L) 08/11/2018   LDLDIRECT 119.0 02/10/2018   LDLCALC 76 08/11/2018   ALT 16 08/11/2018   AST 18 08/11/2018   NA 139 08/11/2018   K 4.3 08/11/2018   CL 106 08/11/2018   CREATININE 0.90 08/11/2018   BUN 15 08/11/2018   CO2 24 08/11/2018   TSH 0.96 08/11/2018   INR 0.96 08/13/2017   HGBA1C 8.9 (H) 08/11/2018   MICROALBUR 0.4 08/11/2018    Ct Chest W Contrast  Result Date: 05/05/2018 CLINICAL DATA:  63 year old female with history of right-sided lung cancer diagnosed in July 2019 status post right upper lobectomy. No follow-up chemotherapy or radiation therapy. Staging examination. Shortness of breath and productive cough. EXAM: CT CHEST WITH CONTRAST TECHNIQUE: Multidetector CT imaging of the chest was performed during intravenous contrast administration. CONTRAST:  83m OMNIPAQUE IOHEXOL 300 MG/ML  SOLN COMPARISON:  Chest CT 06/12/2017.  PET-CT 07/16/2017. FINDINGS: Cardiovascular: Heart size is normal. There is no significant pericardial fluid, thickening or pericardial calcification. There is aortic atherosclerosis, as well as atherosclerosis of the great vessels of the mediastinum and the coronary arteries, including calcified atherosclerotic plaque in the left anterior descending and right coronary arteries. Mediastinum/Nodes: No pathologically enlarged mediastinal or hilar lymph nodes. Esophagus is unremarkable in appearance. No axillary lymphadenopathy. Lungs/Pleura: Postoperative changes of prior right-sided thoracotomy. Status post right upper lobectomy. Compensatory hyperexpansion of the right middle and lower lobes. No suspicious appearing pulmonary nodules or masses are noted. No acute consolidative airspace disease. No pleural effusions. Diffuse bronchial wall thickening with moderate  centrilobular and mild paraseptal emphysema. Upper Abdomen: Diffuse low attenuation throughout the visualized hepatic parenchyma, indicative of hepatic steatosis. Musculoskeletal: There are no aggressive appearing lytic or blastic lesions noted in the visualized portions of the skeleton. IMPRESSION: 1. Status post right upper lobectomy. No findings to suggest residual disease or metastatic disease in the thorax. 2. Aortic atherosclerosis, in addition to 2 vessel coronary artery disease. Please note that although the presence of coronary artery calcium documents the presence of coronary artery disease, the severity of this disease and any potential stenosis cannot be assessed on this non-gated CT examination. Assessment for potential risk factor modification, dietary therapy or pharmacologic therapy may be warranted, if clinically indicated.  3. Diffuse bronchial wall thickening with moderate centrilobular and mild paraseptal emphysema; imaging findings suggestive of underlying COPD. 4. Hepatic steatosis. Aortic Atherosclerosis (ICD10-I70.0) and Emphysema (ICD10-J43.9). Electronically Signed   By: Vinnie Langton M.D.   On: 05/05/2018 11:36    Assessment & Plan:   Ceilidh was seen today for follow-up.  Diagnoses and all orders for this visit:  Essential hypertension -     Basic metabolic panel -     CBC w/Diff -     TSH -     Referral to Nutrition and Diabetes Services  Type 2 diabetes mellitus with hyperglycemia, without long-term current use of insulin (HCC) -     Hemoglobin A1c -     Hepatic function panel -     Microalbumin / creatinine urine ratio -     Referral to Nutrition and Diabetes Services -     empagliflozin (JARDIANCE) 10 MG TABS tablet; Take 10 mg by mouth daily. -     metFORMIN (GLUCOPHAGE) 850 MG tablet; Take 1 tablet (850 mg total) by mouth 2 (two) times daily with a meal.  Hypertriglyceridemia -     Lipid panel -     Hepatic function panel -     TSH -     Referral to  Nutrition and Diabetes Services  Tinea pedis of both feet -     miconazole (MICOTIN) 2 % powder; Apply topically 2 (two) times a day.  Onychomycosis  Major depressive disorder with single episode, in full remission ([place]) -     citalopram (CELEXA) 40 MG tablet; Take 1 tablet (40 mg total) by mouth daily.   I have discontinued Jylian Mittleman's ketoconazole, metFORMIN, and mirtazapine. I am also having her start on miconazole, empagliflozin, and metFORMIN. Additionally, I am having her maintain her acetaminophen, sodium chloride, cetirizine, fenofibrate, blood glucose meter kit and supplies, ezetimibe, aspirin EC, rosuvastatin, fluticasone, ProAir HFA, and citalopram.  Meds ordered this encounter  Medications   miconazole (MICOTIN) 2 % powder    Sig: Apply topically 2 (two) times a day.    Dispense:  70 g    Refill:  3    Order Specific Question:   Supervising Provider    Answer:   Lucille Passy [[id number]]   empagliflozin (JARDIANCE) 10 MG TABS tablet    Sig: Take 10 mg by mouth daily.    Dispense:  30 tablet    Refill:  2    Order Specific Question:   Supervising Provider    Answer:   Lucille Passy [[id number]]   citalopram (CELEXA) 40 MG tablet    Sig: Take 1 tablet (40 mg total) by mouth daily.    Dispense:  90 tablet    Refill:  3    Order Specific Question:   Supervising Provider    Answer:   Lucille Passy [[id number]]   metFORMIN (GLUCOPHAGE) 850 MG tablet    Sig: Take 1 tablet (850 mg total) by mouth 2 (two) times daily with a meal.    Dispense:  60 tablet    Refill:  5    Order Specific Question:   Supervising Provider    Answer:   Lucille Passy [[id number]]    Problem List Items Addressed This Visit      Cardiovascular and Mediastinum   Essential hypertension - Primary    BP Readings from Last 3 Encounters:  08/11/18 110/70  03/03/18 115/60  02/10/18 118/70   BP at goal without medication.  Relevant Orders   Basic metabolic panel (Completed)   CBC w/Diff (Completed)    TSH (Completed)   Referral to Nutrition and Diabetes Services     Endocrine   DM (diabetes mellitus) ([place])    Uncontrolled with hgbA1c of 8.9. Current use of metformin. Added Jardiance. Negative urine microalbumin. Needs to schedule eye exam. Up to date with dental cleaning. Denies any neuropathy or claudication. Former smoker, quit 2019.  F/up in 40month     Relevant Medications   empagliflozin (JARDIANCE) 10 MG TABS tablet   metFORMIN (GLUCOPHAGE) 850 MG tablet   Other Relevant Orders   Hemoglobin A1c (Completed)   Hepatic function panel (Completed)   Microalbumin / creatinine urine ratio (Completed)   Referral to Nutrition and Diabetes Services     Musculoskeletal and Integument   Onychomycosis   Relevant Medications   miconazole (MICOTIN) 2 % powder     Other   Depression, major, single episode, complete remission (HCC)   Relevant Medications   citalopram (CELEXA) 40 MG tablet   Hypertriglyceridemia    Not at goal with zetia and fenofibrate. Followed by Dr. RHarrington Challenger she added crestor. Lipid Panel     Component Value Date/Time   CHOL 137 08/11/2018 1416   TRIG 223 (H) 08/11/2018 1416   HDL 30 (L) 08/11/2018 1416   CHOLHDL 4.6 08/11/2018 1416   VLDL 58.8 (H) 02/10/2018 1519   LDLCALC 76 08/11/2018 1416   LDLDIRECT 119.0 02/10/2018 1519        Relevant Orders   Lipid panel (Completed)   Hepatic function panel (Completed)   TSH (Completed)   Referral to Nutrition and Diabetes Services    Other Visit Diagnoses    Tinea pedis of both feet       Relevant Medications   miconazole (MICOTIN) 2 % powder      Follow-up: Return in about 4 weeks (around 09/08/2018) for DM (repeat CMP and urinalysis, needs mammogram, eye exam, pneumonia vaccine, and foot exam).  CWilfred Lacy NP

## 2018-08-12 LAB — CBC WITH DIFFERENTIAL/PLATELET
Absolute Monocytes: 640 cells/uL (ref 200–950)
Basophils Absolute: 70 cells/uL (ref 0–200)
Basophils Relative: 0.7 %
Eosinophils Absolute: 110 cells/uL (ref 15–500)
Eosinophils Relative: 1.1 %
HCT: 46.3 % — ABNORMAL HIGH (ref 35.0–45.0)
Hemoglobin: 15.2 g/dL (ref 11.7–15.5)
Lymphs Abs: 3460 cells/uL (ref 850–3900)
MCH: 27.6 pg (ref 27.0–33.0)
MCHC: 32.8 g/dL (ref 32.0–36.0)
MCV: 84.2 fL (ref 80.0–100.0)
MPV: 11.1 fL (ref 7.5–12.5)
Monocytes Relative: 6.4 %
Neutro Abs: 5720 cells/uL (ref 1500–7800)
Neutrophils Relative %: 57.2 %
Platelets: 358 10*3/uL (ref 140–400)
RBC: 5.5 10*6/uL — ABNORMAL HIGH (ref 3.80–5.10)
RDW: 13.1 % (ref 11.0–15.0)
Total Lymphocyte: 34.6 %
WBC: 10 10*3/uL (ref 3.8–10.8)

## 2018-08-12 LAB — BASIC METABOLIC PANEL WITH GFR
BUN: 15 mg/dL (ref 7–25)
CO2: 24 mmol/L (ref 20–32)
Calcium: 10.1 mg/dL (ref 8.6–10.4)
Chloride: 106 mmol/L (ref 98–110)
Creat: 0.9 mg/dL (ref 0.50–0.99)
Glucose, Bld: 159 mg/dL — ABNORMAL HIGH (ref 65–99)
Potassium: 4.3 mmol/L (ref 3.5–5.3)
Sodium: 139 mmol/L (ref 135–146)

## 2018-08-12 LAB — TSH: TSH: 0.96 mIU/L (ref 0.40–4.50)

## 2018-08-12 LAB — MICROALBUMIN / CREATININE URINE RATIO
Creatinine, Urine: 66 mg/dL (ref 20–275)
Microalb Creat Ratio: 6 mcg/mg creat (ref <30)
Microalb, Ur: 0.4 mg/dL

## 2018-08-12 LAB — LIPID PANEL
Cholesterol: 137 mg/dL
HDL: 30 mg/dL — ABNORMAL LOW
LDL Cholesterol (Calc): 76 mg/dL
Non-HDL Cholesterol (Calc): 107 mg/dL
Total CHOL/HDL Ratio: 4.6 (calc)
Triglycerides: 223 mg/dL — ABNORMAL HIGH

## 2018-08-12 LAB — HEPATIC FUNCTION PANEL
AG Ratio: 1.5 (calc) (ref 1.0–2.5)
ALT: 16 U/L (ref 6–29)
AST: 18 U/L (ref 10–35)
Albumin: 4.6 g/dL (ref 3.6–5.1)
Alkaline phosphatase (APISO): 74 U/L (ref 37–153)
Bilirubin, Direct: 0.1 mg/dL (ref 0.0–0.2)
Globulin: 3.1 g/dL (calc) (ref 1.9–3.7)
Indirect Bilirubin: 0.2 mg/dL (calc) (ref 0.2–1.2)
Total Bilirubin: 0.3 mg/dL (ref 0.2–1.2)
Total Protein: 7.7 g/dL (ref 6.1–8.1)

## 2018-08-12 LAB — HEMOGLOBIN A1C
Hgb A1c MFr Bld: 8.9 % of total Hgb — ABNORMAL HIGH (ref <5.7)
Mean Plasma Glucose: 209 (calc)
eAG (mmol/L): 11.6 (calc)

## 2018-08-12 MED ORDER — METFORMIN HCL 850 MG PO TABS: 850.0000 mg | ORAL_TABLET | Freq: Two times a day (BID) | ORAL | 5 refills | Status: DC

## 2018-08-12 MED ORDER — EMPAGLIFLOZIN 10 MG PO TABS: 10.0000 mg | ORAL_TABLET | Freq: Every day | ORAL | 2 refills | Status: DC

## 2018-08-12 MED ORDER — CITALOPRAM HYDROBROMIDE 40 MG PO TABS: 40.0000 mg | ORAL_TABLET | Freq: Every day | ORAL | 3 refills | Status: DC

## 2018-08-12 NOTE — Assessment & Plan Note (Signed)
BP Readings from Last 3 Encounters:  08/11/18 110/70  03/03/18 115/60  02/10/18 118/70   BP at goal without medication.

## 2018-08-12 NOTE — Assessment & Plan Note (Signed)
Uncontrolled with hgbA1c of 8.9. Current use of metformin. Added Jardiance. Negative urine microalbumin. Needs to schedule eye exam. Up to date with dental cleaning. Denies any neuropathy or claudication. Former smoker, quit 2019.  F/up in 66month

## 2018-08-12 NOTE — Assessment & Plan Note (Signed)
repeat MRI ABD with Eovist enhanced MRI 08/2018 for re eval of hypervascular lesion in right hepatic lobe and right UPJ stenosis

## 2018-08-12 NOTE — Assessment & Plan Note (Signed)
Not at goal with zetia and fenofibrate. Followed by Dr. Harrington Challenger: she added crestor. Lipid Panel     Component Value Date/Time   CHOL 137 08/11/2018 1416   TRIG 223 (H) 08/11/2018 1416   HDL 30 (L) 08/11/2018 1416   CHOLHDL 4.6 08/11/2018 1416   VLDL 58.8 (H) 02/10/2018 1519   LDLCALC 76 08/11/2018 1416   LDLDIRECT 119.0 02/10/2018 1519

## 2018-08-26 ENCOUNTER — Telehealth: Payer: Self-pay | Admitting: Nurse Practitioner

## 2018-08-26 NOTE — Telephone Encounter (Signed)
Hold Jardiance She is due to f/up appt with me. Please schedule appt

## 2018-08-26 NOTE — Telephone Encounter (Signed)
Please advise 

## 2018-08-26 NOTE — Telephone Encounter (Signed)
appt made

## 2018-08-26 NOTE — Telephone Encounter (Signed)
Pt is calling and since starting jardiance she has been having headaches. This started on second or third day of  taking medication. Please advice

## 2018-09-02 ENCOUNTER — Other Ambulatory Visit: Payer: Self-pay

## 2018-09-02 ENCOUNTER — Ambulatory Visit (INDEPENDENT_AMBULATORY_CARE_PROVIDER_SITE_OTHER): Payer: PPO | Admitting: Nurse Practitioner

## 2018-09-02 ENCOUNTER — Encounter: Payer: Self-pay | Admitting: Nurse Practitioner

## 2018-09-02 VITALS — BP 134/80 | HR 97 | Temp 98.5°F | Ht 69.0 in | Wt 172.6 lb

## 2018-09-02 DIAGNOSIS — E1165 Type 2 diabetes mellitus with hyperglycemia: Secondary | ICD-10-CM | POA: Diagnosis not present

## 2018-09-02 DIAGNOSIS — Q6211 Congenital occlusion of ureteropelvic junction: Secondary | ICD-10-CM | POA: Diagnosis not present

## 2018-09-02 DIAGNOSIS — Z1231 Encounter for screening mammogram for malignant neoplasm of breast: Secondary | ICD-10-CM

## 2018-09-02 DIAGNOSIS — K769 Liver disease, unspecified: Secondary | ICD-10-CM | POA: Diagnosis not present

## 2018-09-02 DIAGNOSIS — Z23 Encounter for immunization: Secondary | ICD-10-CM

## 2018-09-02 MED ORDER — BLOOD GLUCOSE MONITOR KIT: PACK | 0 refills | Status: DC

## 2018-09-02 MED ORDER — GLIPIZIDE ER 5 MG PO TB24: 5.0000 mg | ORAL_TABLET | Freq: Every day | ORAL | 1 refills | Status: DC

## 2018-09-02 NOTE — Patient Instructions (Addendum)
Start glipizide with heaviest meal Stop jardiance. Continue metformin. Check glucose before breakfast and before supper. Bring glucose reading to next office visit.  You will be contacted to schedule appt for mammogram and for repeat MRI  Diabetes Mellitus and Nutrition, Adult When you have diabetes (diabetes mellitus), it is very important to have healthy eating habits because your blood sugar (glucose) levels are greatly affected by what you eat and drink. Eating healthy foods in the appropriate amounts, at about the same times every day, can help you:  Control your blood glucose.  Lower your risk of heart disease.  Improve your blood pressure.  Reach or maintain a healthy weight. Every person with diabetes is different, and each person has different needs for a meal plan. Your health care provider may recommend that you work with a diet and nutrition specialist (dietitian) to make a meal plan that is best for you. Your meal plan may vary depending on factors such as:  The calories you need.  The medicines you take.  Your weight.  Your blood glucose, blood pressure, and cholesterol levels.  Your activity level.  Other health conditions you have, such as heart or kidney disease. How do carbohydrates affect me? Carbohydrates, also called carbs, affect your blood glucose level more than any other type of food. Eating carbs naturally raises the amount of glucose in your blood. Carb counting is a method for keeping track of how many carbs you eat. Counting carbs is important to keep your blood glucose at a healthy level, especially if you use insulin or take certain oral diabetes medicines. It is important to know how many carbs you can safely have in each meal. This is different for every person. Your dietitian can help you calculate how many carbs you should have at each meal and for each snack. Foods that contain carbs include:  Bread, cereal, rice, pasta, and  crackers.  Potatoes and corn.  Peas, beans, and lentils.  Milk and yogurt.  Fruit and juice.  Desserts, such as cakes, cookies, ice cream, and candy. How does alcohol affect me? Alcohol can cause a sudden decrease in blood glucose (hypoglycemia), especially if you use insulin or take certain oral diabetes medicines. Hypoglycemia can be a life-threatening condition. Symptoms of hypoglycemia (sleepiness, dizziness, and confusion) are similar to symptoms of having too much alcohol. If your health care provider says that alcohol is safe for you, follow these guidelines:  Limit alcohol intake to no more than 1 drink per day for nonpregnant women and 2 drinks per day for men. One drink equals 12 oz of beer, 5 oz of wine, or 1 oz of hard liquor.  Do not drink on an empty stomach.  Keep yourself hydrated with water, diet soda, or unsweetened iced tea.  Keep in mind that regular soda, juice, and other mixers may contain a lot of sugar and must be counted as carbs. What are tips for following this plan?  Reading food labels  Start by checking the serving size on the "Nutrition Facts" label of packaged foods and drinks. The amount of calories, carbs, fats, and other nutrients listed on the label is based on one serving of the item. Many items contain more than one serving per package.  Check the total grams (g) of carbs in one serving. You can calculate the number of servings of carbs in one serving by dividing the total carbs by 15. For example, if a food has 30 g of total carbs, it would  be equal to 2 servings of carbs.  Check the number of grams (g) of saturated and trans fats in one serving. Choose foods that have low or no amount of these fats.  Check the number of milligrams (mg) of salt (sodium) in one serving. Most people should limit total sodium intake to less than 2,300 mg per day.  Always check the nutrition information of foods labeled as "low-fat" or "nonfat". These foods may be  higher in added sugar or refined carbs and should be avoided.  Talk to your dietitian to identify your daily goals for nutrients listed on the label. Shopping  Avoid buying canned, premade, or processed foods. These foods tend to be high in fat, sodium, and added sugar.  Shop around the outside edge of the grocery store. This includes fresh fruits and vegetables, bulk grains, fresh meats, and fresh dairy. Cooking  Use low-heat cooking methods, such as baking, instead of high-heat cooking methods like deep frying.  Cook using healthy oils, such as olive, canola, or sunflower oil.  Avoid cooking with butter, cream, or high-fat meats. Meal planning  Eat meals and snacks regularly, preferably at the same times every day. Avoid going long periods of time without eating.  Eat foods high in fiber, such as fresh fruits, vegetables, beans, and whole grains. Talk to your dietitian about how many servings of carbs you can eat at each meal.  Eat 4-6 ounces (oz) of lean protein each day, such as lean meat, chicken, fish, eggs, or tofu. One oz of lean protein is equal to: ? 1 oz of meat, chicken, or fish. ? 1 egg. ?  cup of tofu.  Eat some foods each day that contain healthy fats, such as avocado, nuts, seeds, and fish. Lifestyle  Check your blood glucose regularly.  Exercise regularly as told by your health care provider. This may include: ? 150 minutes of moderate-intensity or vigorous-intensity exercise each week. This could be brisk walking, biking, or water aerobics. ? Stretching and doing strength exercises, such as yoga or weightlifting, at least 2 times a week.  Take medicines as told by your health care provider.  Do not use any products that contain nicotine or tobacco, such as cigarettes and e-cigarettes. If you need help quitting, ask your health care provider.  Work with a Social worker or diabetes educator to identify strategies to manage stress and any emotional and social  challenges. Questions to ask a health care provider  Do I need to meet with a diabetes educator?  Do I need to meet with a dietitian?  What number can I call if I have questions?  When are the best times to check my blood glucose? Where to find more information:  American Diabetes Association: diabetes.org  Academy of Nutrition and Dietetics: www.eatright.[employer] of Diabetes and Digestive and Kidney Diseases (NIH): [employer] Summary  A healthy meal plan will help you control your blood glucose and maintain a healthy lifestyle.  Working with a diet and nutrition specialist (dietitian) can help you make a meal plan that is best for you.  Keep in mind that carbohydrates (carbs) and alcohol have immediate effects on your blood glucose levels. It is important to count carbs and to use alcohol carefully. This information is not intended to replace advice given to you by your health care provider. Make sure you discuss any questions you have with your health care provider. Document Released: 10/04/2004 Document Revised: 12/20/2016 Document Reviewed: 02/12/2016 Elsevier Patient Education  2020  [employer].

## 2018-09-02 NOTE — Progress Notes (Signed)
Subjective:  Patient ID: Audrey Russell, female    DOB: 19-Oct-1955  Age: 63 y.o. MRN: [id number]  CC: Follow-up (follow up jardiance--give her a headache)  HPI  DM: Uncontrolled, last hgb A1c 8.9 unable to tolerate jardiance after use x 2days. Use od metformin '850mg'$  BID without adverse effects Need glucometer prescription. Chronic neuropathy. Needs DM eye exam.  Reviewed past Medical, Social and Family history today.  Outpatient Medications Prior to Visit  Medication Sig Dispense Refill  . acetaminophen (TYLENOL) 500 MG tablet Take 1,000 mg by mouth every 6 (six) hours as needed for mild pain or moderate pain.    Marland Kitchen aspirin EC 81 MG tablet Take 1 tablet (81 mg total) by mouth daily. 90 tablet 3  . cetirizine (ZYRTEC) 10 MG tablet TAKE 1 TABLET BY MOUTH EVERYDAY AT BEDTIME 30 tablet 1  . citalopram (CELEXA) 40 MG tablet Take 1 tablet (40 mg total) by mouth daily. 90 tablet 3  . ezetimibe (ZETIA) 10 MG tablet Take 1 tablet (10 mg total) by mouth every evening. 90 tablet 3  . fenofibrate 160 MG tablet Take 1 tablet (160 mg total) by mouth daily. 90 tablet 3  . fluticasone (FLONASE) 50 MCG/ACT nasal spray SPRAY 2 SPRAYS INTO EACH NOSTRIL EVERY DAY 16 mL 0  . metFORMIN (GLUCOPHAGE) 850 MG tablet Take 1 tablet (850 mg total) by mouth 2 (two) times daily with a meal. 60 tablet 5  . miconazole (MICOTIN) 2 % powder Apply topically 2 (two) times a day. 70 g 3  . PROAIR HFA 108 (90 Base) MCG/ACT inhaler TAKE 2 PUFFS BY MOUTH EVERY 6 HOURS AS NEEDED FOR WHEEZE OR SHORTNESS OF BREATH 8.5 g 5  . rosuvastatin (CRESTOR) 10 MG tablet Take 1 tablet (10 mg total) by mouth daily. 90 tablet 3  . sodium chloride (OCEAN) 0.65 % SOLN nasal spray Place 1 spray into both nostrils as needed for congestion. 15 mL 0  . empagliflozin (JARDIANCE) 10 MG TABS tablet Take 10 mg by mouth daily. 30 tablet 2  . blood glucose meter kit and supplies KIT Dispense based on patient and insurance preference. Use before  breakfast (E11.9). (Patient not taking: Reported on 09/02/2018) 1 each 0   No facility-administered medications prior to visit.     ROS See HPI  Objective:  BP 134/80   Pulse 97   Temp 98.5 F (36.9 C) (Oral)   Ht '5\' 9"'$  (1.753 m)   Wt 172 lb 9.6 oz (78.3 kg)   SpO2 97%   BMI 25.49 kg/m   BP Readings from Last 3 Encounters:  09/02/18 134/80  08/11/18 110/70  03/03/18 115/60    Wt Readings from Last 3 Encounters:  09/02/18 172 lb 9.6 oz (78.3 kg)  08/11/18 172 lb 12.8 oz (78.4 kg)  03/03/18 183 lb (83 kg)    Physical Exam Vitals signs reviewed.  Neck:     Musculoskeletal: Normal range of motion and neck supple.  Cardiovascular:     Rate and Rhythm: Normal rate and regular rhythm.     Pulses: Normal pulses.     Heart sounds: Normal heart sounds.  Pulmonary:     Effort: Pulmonary effort is normal.     Breath sounds: Normal breath sounds.  Abdominal:     General: Bowel sounds are normal.     Palpations: Abdomen is soft.  Musculoskeletal:     Right lower leg: No edema.     Left lower leg: No edema.  Neurological:  General: No focal deficit present.     Mental Status: She is alert and oriented to person, place, and time.     Lab Results  Component Value Date   WBC 10.0 08/11/2018   HGB 15.2 08/11/2018   HCT 46.3 (H) 08/11/2018   PLT 358 08/11/2018   GLUCOSE 159 (H) 08/11/2018   CHOL 137 08/11/2018   TRIG 223 (H) 08/11/2018   HDL 30 (L) 08/11/2018   LDLDIRECT 119.0 02/10/2018   LDLCALC 76 08/11/2018   ALT 16 08/11/2018   AST 18 08/11/2018   NA 139 08/11/2018   K 4.3 08/11/2018   CL 106 08/11/2018   CREATININE 0.90 08/11/2018   BUN 15 08/11/2018   CO2 24 08/11/2018   TSH 0.96 08/11/2018   INR 0.96 08/13/2017   HGBA1C 8.9 (H) 08/11/2018   MICROALBUR 0.4 08/11/2018    Ct Chest W Contrast  Result Date: 05/05/2018 CLINICAL DATA:  63 year old female with history of right-sided lung cancer diagnosed in July 2019 status post right upper lobectomy.  No follow-up chemotherapy or radiation therapy. Staging examination. Shortness of breath and productive cough. EXAM: CT CHEST WITH CONTRAST TECHNIQUE: Multidetector CT imaging of the chest was performed during intravenous contrast administration. CONTRAST:  11m OMNIPAQUE IOHEXOL 300 MG/ML  SOLN COMPARISON:  Chest CT 06/12/2017.  PET-CT 07/16/2017. FINDINGS: Cardiovascular: Heart size is normal. There is no significant pericardial fluid, thickening or pericardial calcification. There is aortic atherosclerosis, as well as atherosclerosis of the great vessels of the mediastinum and the coronary arteries, including calcified atherosclerotic plaque in the left anterior descending and right coronary arteries. Mediastinum/Nodes: No pathologically enlarged mediastinal or hilar lymph nodes. Esophagus is unremarkable in appearance. No axillary lymphadenopathy. Lungs/Pleura: Postoperative changes of prior right-sided thoracotomy. Status post right upper lobectomy. Compensatory hyperexpansion of the right middle and lower lobes. No suspicious appearing pulmonary nodules or masses are noted. No acute consolidative airspace disease. No pleural effusions. Diffuse bronchial wall thickening with moderate centrilobular and mild paraseptal emphysema. Upper Abdomen: Diffuse low attenuation throughout the visualized hepatic parenchyma, indicative of hepatic steatosis. Musculoskeletal: There are no aggressive appearing lytic or blastic lesions noted in the visualized portions of the skeleton. IMPRESSION: 1. Status post right upper lobectomy. No findings to suggest residual disease or metastatic disease in the thorax. 2. Aortic atherosclerosis, in addition to 2 vessel coronary artery disease. Please note that although the presence of coronary artery calcium documents the presence of coronary artery disease, the severity of this disease and any potential stenosis cannot be assessed on this non-gated CT examination. Assessment for  potential risk factor modification, dietary therapy or pharmacologic therapy may be warranted, if clinically indicated. 3. Diffuse bronchial wall thickening with moderate centrilobular and mild paraseptal emphysema; imaging findings suggestive of underlying COPD. 4. Hepatic steatosis. Aortic Atherosclerosis (ICD10-I70.0) and Emphysema (ICD10-J43.9). Electronically Signed   By: DVinnie LangtonM.D.   On: 05/05/2018 11:36   Assessment & Plan:   MNaminewas seen today for follow-up.  Diagnoses and all orders for this visit:  Type 2 diabetes mellitus with hyperglycemia, without long-term current use of insulin (HCC) -     glipiZIDE (GLUCOTROL XL) 5 MG 24 hr tablet; Take 1 tablet (5 mg total) by mouth daily after supper. -     blood glucose meter kit and supplies KIT; Dispense based on patient and insurance preference. Use before breakfast  And before supper (E11.9).  Breast cancer screening by mammogram -     MM DIGITAL SCREENING BILATERAL; Future  Need  for pneumococcal vaccination -     Pneumococcal polysaccharide vaccine 23-valent greater than or equal to 2yo subcutaneous/IM  Hepatic lesion -     MR ABDOMEN W CONTRAST; Future  Stenosis of ureteropelvic junction (UPJ) -     MR ABDOMEN W CONTRAST; Future   I have discontinued Coryn Conroy's empagliflozin. I have also changed her blood glucose meter kit and supplies. Additionally, I am having her start on glipiZIDE. Lastly, I am having her maintain her acetaminophen, sodium chloride, cetirizine, fenofibrate, ezetimibe, aspirin EC, rosuvastatin, fluticasone, ProAir HFA, miconazole, citalopram, and metFORMIN.  Meds ordered this encounter  Medications  . glipiZIDE (GLUCOTROL XL) 5 MG 24 hr tablet    Sig: Take 1 tablet (5 mg total) by mouth daily after supper.    Dispense:  90 tablet    Refill:  1    Order Specific Question:   Supervising Provider    Answer:   Lucille Passy [[id number]]  . blood glucose meter kit and supplies KIT    Sig:  Dispense based on patient and insurance preference. Use before breakfast  And before supper (E11.9).    Dispense:  1 each    Refill:  0    Order Specific Question:   Supervising Provider    Answer:   Lucille Passy [[id number]]    Order Specific Question:   Number of strips    Answer:   100    Order Specific Question:   Number of lancets    Answer:   100    Problem List Items Addressed This Visit      Endocrine   DM (diabetes mellitus) ([place]) - Primary   Relevant Medications   glipiZIDE (GLUCOTROL XL) 5 MG 24 hr tablet   blood glucose meter kit and supplies KIT     Other   Hepatic lesion   Relevant Orders   MR ABDOMEN W CONTRAST    Other Visit Diagnoses    Breast cancer screening by mammogram       Relevant Orders   MM DIGITAL SCREENING BILATERAL   Need for pneumococcal vaccination       Relevant Orders   Pneumococcal polysaccharide vaccine 23-valent greater than or equal to 2yo subcutaneous/IM (Completed)   Stenosis of ureteropelvic junction (UPJ)       Relevant Orders   MR ABDOMEN W CONTRAST       Follow-up: Return in about 4 weeks (around 09/30/2018) for DM and HTN (fasting).  Wilfred Lacy, NP

## 2018-09-07 ENCOUNTER — Encounter: Payer: Self-pay | Admitting: Nurse Practitioner

## 2018-09-18 ENCOUNTER — Telehealth: Payer: Self-pay | Admitting: Nurse Practitioner

## 2018-09-18 NOTE — Telephone Encounter (Signed)
Patient was advised by her pharmacist metFORMIN (GLUCOPHAGE) 850 MG tablet is on recall, patient would like to speak with nurse today, please advise

## 2018-09-18 NOTE — Telephone Encounter (Signed)
Spoke with the pharmacy and confirm that there is not recall of metformin 850 mg and pharmacy also stated they do not have recall batch of metformin 500 XR at their location.   Pt is aware.

## 2018-09-19 ENCOUNTER — Other Ambulatory Visit: Payer: Self-pay | Admitting: Nurse Practitioner

## 2018-09-19 DIAGNOSIS — E1169 Type 2 diabetes mellitus with other specified complication: Secondary | ICD-10-CM

## 2018-09-21 ENCOUNTER — Ambulatory Visit: Payer: Self-pay | Admitting: Internal Medicine

## 2018-09-22 ENCOUNTER — Other Ambulatory Visit: Payer: Self-pay | Admitting: Nurse Practitioner

## 2018-09-22 ENCOUNTER — Ambulatory Visit: Payer: PPO | Admitting: Registered"

## 2018-09-22 DIAGNOSIS — K769 Liver disease, unspecified: Secondary | ICD-10-CM

## 2018-09-22 DIAGNOSIS — Q6211 Congenital occlusion of ureteropelvic junction: Secondary | ICD-10-CM
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
## 2018-09-24 ENCOUNTER — Ambulatory Visit (INDEPENDENT_AMBULATORY_CARE_PROVIDER_SITE_OTHER): Payer: PPO | Admitting: Internal Medicine

## 2018-09-24 ENCOUNTER — Other Ambulatory Visit: Payer: Self-pay | Admitting: Nurse Practitioner

## 2018-09-24 ENCOUNTER — Other Ambulatory Visit: Payer: Self-pay

## 2018-09-24 ENCOUNTER — Encounter: Payer: Self-pay | Admitting: Internal Medicine

## 2018-09-24 VITALS — BP 120/68 | HR 76 | Ht 69.0 in | Wt 170.0 lb

## 2018-09-24 DIAGNOSIS — Z01812 Encounter for preprocedural laboratory examination: Secondary | ICD-10-CM | POA: Diagnosis not present

## 2018-09-24 DIAGNOSIS — R079 Chest pain, unspecified: Secondary | ICD-10-CM | POA: Diagnosis not present

## 2018-09-24 DIAGNOSIS — R0602 Shortness of breath: Secondary | ICD-10-CM | POA: Diagnosis not present

## 2018-09-24 DIAGNOSIS — F339 Major depressive disorder, recurrent, unspecified: Secondary | ICD-10-CM

## 2018-09-24 MED ORDER — METOPROLOL TARTRATE 100 MG PO TABS: ORAL_TABLET | ORAL | 0 refills | Status: DC

## 2018-09-24 NOTE — Patient Instructions (Addendum)
Medication Instructions:  No changes If you need a refill on your cardiac medications before your next appointment, please call your pharmacy.   Lab work: Today: BMET If you have labs (blood work) drawn today and your tests are completely normal, you will receive your results only by: Marland Kitchen MyChart Message (if you have MyChart) OR . A paper copy in the mail If you have any lab test that is abnormal or we need to change your treatment, we will call you to review the results.  Testing/Procedures: Your physician has requested that you have cardiac CT. Cardiac computed tomography (CT) is a painless test that uses an x-ray machine to take clear, detailed pictures of your heart. For further information please visit [email or web address][employer][email or web address]. Please follow instruction sheet as given.   Follow-Up: At [facility], you and your health needs are our priority.  As part of our continuing mission to provide you with exceptional heart care, we have created designated Provider Care Teams.  These Care Teams include your primary Cardiologist (physician) and Advanced Practice Providers (APPs -  Physician Assistants and Nurse Practitioners) who all work together to provide you with the care you need, when you need it. You will need a follow up appointment in:  6 months.  Please call our office 2 months in advance to schedule this appointment.  You may see Dorris Carnes, MD or one of the following Advanced Practice Providers on your designated Care Team: Richardson Dopp, PA-C [place], [place] . Daune Perch, NP  Any Other Special Instructions Will Be Listed Below (If Applicable).    Your cardiac CT will be scheduled at one of the below locations:   [facility] [address] [place],  [address] (336) [place] [address] [place],  [address] [phone]  Please arrive at the [facility] main entrance of [facility] 30-45 minutes prior to test start time. Proceed to the [facility] Radiology Department (first floor) to check-in and test prep.  Please follow these instructions carefully (unless otherwise directed):   On the Night Before the Test: . Be sure to Drink plenty of water. . Do not consume any caffeinated/decaffeinated beverages or chocolate 12 hours prior to your test. . Do not take any antihistamines 12 hours prior to your test. If you take Metformin do not take 24 hours prior to test.  On the Day of the Test: . Drink plenty of water. Do not drink any water within one hour of the test. . Do not eat any food 4 hours prior to the test. . You may take your regular medications prior to the test.  . Take metoprolol (Lopressor) two hours prior to test. . FEMALES- please wear underwire-free bra if available       After the Test: . Drink plenty of water. . After receiving IV contrast, you may experience a mild flushed feeling. This is normal. . On occasion, you may experience a mild rash up to 24 hours after the test. This is not dangerous. If this occurs, you can take Benadryl 25 mg and increase your fluid intake. . If you experience trouble breathing, this can be serious. If it is severe call 911 IMMEDIATELY. If it is mild, please call our office. . If you take any of these medications: Glipizide/Metformin, Avandament, Glucavance, please do not take 48 hours after completing test.    Please contact the cardiac imaging nurse navigator should you have  any questions/concerns Marchia Bond, RN Navigator Cardiac Imaging [facility] [phone] Office  [phone] Cell

## 2018-09-25 ENCOUNTER — Ambulatory Visit
Admission: RE | Admit: 2018-09-25 | Discharge: 2018-09-25 | Disposition: A | Discharge status: Home / Self Care | Payer: PPO | Source: Ambulatory Visit | Attending: Nurse Practitioner | Admitting: Nurse Practitioner

## 2018-09-25 DIAGNOSIS — Z1231 Encounter for screening mammogram for malignant neoplasm of breast: Secondary | ICD-10-CM | POA: Diagnosis not present

## 2018-09-25 LAB — BASIC METABOLIC PANEL
BUN/Creatinine Ratio: 11 — ABNORMAL LOW (ref 12–28)
BUN: 9 mg/dL (ref 8–27)
CO2: 23 mmol/L (ref 20–29)
Calcium: 10 mg/dL (ref 8.7–10.3)
Chloride: 103 mmol/L (ref 96–106)
Creatinine, Ser: 0.84 mg/dL (ref 0.57–1.00)
GFR calc Af Amer: 86 mL/min/{1.73_m2} (ref >59)
GFR calc non Af Amer: 75 mL/min/{1.73_m2} (ref >59)
Glucose: 94 mg/dL (ref 65–99)
Potassium: 4.6 mmol/L (ref 3.5–5.2)
Sodium: 141 mmol/L (ref 134–144)

## 2018-09-30 ENCOUNTER — Ambulatory Visit: Payer: PPO | Admitting: Nurse Practitioner

## 2018-09-30 ENCOUNTER — Other Ambulatory Visit: Payer: Self-pay | Admitting: Nurse Practitioner

## 2018-09-30 DIAGNOSIS — R928 Other abnormal and inconclusive findings on diagnostic imaging of breast: Secondary | ICD-10-CM

## 2018-10-01 ENCOUNTER — Telehealth: Payer: Self-pay | Admitting: Nurse Practitioner

## 2018-10-01 NOTE — Telephone Encounter (Signed)

## 2018-10-02 ENCOUNTER — Encounter: Payer: Self-pay | Admitting: Nurse Practitioner

## 2018-10-02 ENCOUNTER — Ambulatory Visit (INDEPENDENT_AMBULATORY_CARE_PROVIDER_SITE_OTHER): Payer: PPO | Admitting: Nurse Practitioner

## 2018-10-02 ENCOUNTER — Other Ambulatory Visit: Payer: Self-pay

## 2018-10-02 VITALS — BP 126/74 | HR 55 | Temp 97.2°F | Ht 69.0 in | Wt 169.0 lb

## 2018-10-02 DIAGNOSIS — F325 Major depressive disorder, single episode, in full remission: Secondary | ICD-10-CM | POA: Diagnosis not present

## 2018-10-02 DIAGNOSIS — E1165 Type 2 diabetes mellitus with hyperglycemia: Secondary | ICD-10-CM

## 2018-10-02 DIAGNOSIS — G35 Multiple sclerosis: Secondary | ICD-10-CM

## 2018-10-02 DIAGNOSIS — I739 Peripheral vascular disease, unspecified: Secondary | ICD-10-CM | POA: Diagnosis not present

## 2018-10-02 DIAGNOSIS — Z23 Encounter for immunization: Secondary | ICD-10-CM | POA: Diagnosis not present

## 2018-10-02 DIAGNOSIS — M25512 Pain in left shoulder: Secondary | ICD-10-CM | POA: Diagnosis not present

## 2018-10-02 MED ORDER — DICLOFENAC SODIUM 1 % TD GEL: 2.0000 g | Freq: Three times a day (TID) | TRANSDERMAL | 1 refills | Status: DC | PRN

## 2018-10-02 MED ORDER — MIRTAZAPINE 30 MG PO TABS: 30.0000 mg | ORAL_TABLET | Freq: Every day | ORAL | 1 refills | Status: DC

## 2018-10-02 NOTE — Patient Instructions (Addendum)
We will get eye exam report from [facility] care.  Maintain current medications.   You will be contacted to schedule appt with neurology and sports medicine.

## 2018-10-02 NOTE — Progress Notes (Addendum)
Subjective:  Patient ID: Audrey Russell, female    DOB: 03-24-1955  Age: 63 y.o. MRN: [id number]  CC: Follow-up (1 mo f/u--DM and BP--fasting--left shoulder/arm,neck pain--unable to sleep--)  Shoulder Pain  The pain is present in the left shoulder. This is a chronic problem. The current episode started more than 1 year ago. There has been no history of extremity trauma. The problem occurs constantly. The problem has been gradually worsening. The quality of the pain is described as aching and dull. Associated symptoms include stiffness. Pertinent negatives include no fever, inability to bear weight, joint swelling, limited range of motion, numbness or tingling. The symptoms are aggravated by activity. She has tried nothing for the symptoms. Family history does not include gout or rheumatoid arthritis. Her past medical history is significant for diabetes and osteoarthritis. There is no history of gout or rheumatoid arthritis.   DM: Last Hgb A1c of  8.8 Home glucose: AM (130-90), PM (140s) No hypoglycemia Current use of metformin and glipizide Normal urine microalbumin, no ACE or ARB at this time. LDL not at goal, current use of zetia and fenofibrate due to elevated triglyceride. Under care of cardiology Needs to schedule eye exam  Depression: Chronic, stable mood per patient with citalopram and remeron Denies any increased sedation with citalopram and remeron.  Hx of bilateral leg weakness: Left leg worse than right,worse after prolonged walking. evaluated by neurology due to hx of MS, Last appt with neurology was 75yr ago. Denies nay tobacco use on last 241yr Reviewed past Medical, Social and Family history today.  Outpatient Medications Prior to Visit  Medication Sig Dispense Refill  . acetaminophen (TYLENOL) 500 MG tablet Take 1,000 mg by mouth every 6 (six) hours as needed for mild pain or moderate pain.    . Marland Kitchenspirin EC 81 MG tablet Take 1 tablet (81 mg total) by mouth  daily. 90 tablet 3  . cetirizine (ZYRTEC) 10 MG tablet TAKE 1 TABLET BY MOUTH EVERYDAY AT BEDTIME 30 tablet 1  . citalopram (CELEXA) 40 MG tablet Take 1 tablet (40 mg total) by mouth daily. 90 tablet 3  . ezetimibe (ZETIA) 10 MG tablet Take 1 tablet (10 mg total) by mouth every evening. 90 tablet 3  . famotidine (PEPCID) 20 MG tablet Take 20 mg by mouth 2 (two) times daily.    . fenofibrate 160 MG tablet Take 1 tablet (160 mg total) by mouth daily. 90 tablet 3  . fluticasone (FLONASE) 50 MCG/ACT nasal spray SPRAY 2 SPRAYS INTO EACH NOSTRIL EVERY DAY 16 mL 0  . glipiZIDE (GLUCOTROL XL) 5 MG 24 hr tablet Take 1 tablet (5 mg total) by mouth daily after supper. 90 tablet 1  . metFORMIN (GLUCOPHAGE) 850 MG tablet Take 1 tablet (850 mg total) by mouth 2 (two) times daily with a meal. 60 tablet 5  . metoprolol tartrate (LOPRESSOR) 100 MG tablet Take one tablet by mouth 2 hours prior to cardiac ct 1 tablet 0  . miconazole (MICOTIN) 2 % powder Apply topically 2 (two) times a day. 70 g 3  . PROAIR HFA 108 (90 Base) MCG/ACT inhaler TAKE 2 PUFFS BY MOUTH EVERY 6 HOURS AS NEEDED FOR WHEEZE OR SHORTNESS OF BREATH 8.5 g 5  . rosuvastatin (CRESTOR) 10 MG tablet Take 1 tablet (10 mg total) by mouth daily. 90 tablet 3  . sodium chloride (OCEAN) 0.65 % SOLN nasal spray Place 1 spray into both nostrils as needed for congestion. 15 mL 0  . blood  glucose meter kit and supplies KIT Dispense based on patient and insurance preference. Use before breakfast  And before supper (E11.9). (Patient not taking: Reported on 10/02/2018) 1 each 0   No facility-administered medications prior to visit.     ROS See HPI  Objective:  BP 126/74   Pulse (!) 55   Temp (!) 97.2 F (36.2 C) (Tympanic)   Ht '5\' 9"'$  (1.753 m)   Wt 169 lb (76.7 kg)   SpO2 100%   BMI 24.96 kg/m   BP Readings from Last 3 Encounters:  10/02/18 126/74  09/24/18 120/68  09/02/18 134/80    Wt Readings from Last 3 Encounters:  10/02/18 169 lb (76.7  kg)  09/24/18 170 lb (77.1 kg)  09/02/18 172 lb 9.6 oz (78.3 kg)    Physical Exam Vitals signs reviewed.  Cardiovascular:     Rate and Rhythm: Normal rate and regular rhythm.     Pulses: Normal pulses.     Heart sounds: Normal heart sounds.  Pulmonary:     Effort: Pulmonary effort is normal.     Breath sounds: Normal breath sounds.  Musculoskeletal:        General: No swelling.     Left shoulder: She exhibits tenderness and pain. She exhibits normal range of motion, no bony tenderness, no swelling, no effusion, no spasm, normal pulse and normal strength.     Left elbow: Normal.     Left wrist: Normal.     Left hip: She exhibits decreased strength.     Left knee: Normal.     Left ankle: Normal.     Cervical back: Normal.     Lumbar back: Normal.     Left upper arm: Normal.     Left forearm: Normal.     Left hand: Normal.     Left upper leg: Normal.     Right lower leg: No edema.     Left lower leg: No edema.     Left foot: Normal.  Skin:    Comments: Abnormal diabetic foot exam (dimnished sensation in left foot)  Neurological:     Mental Status: She is alert and oriented to person, place, and time.     Cranial Nerves: Cranial nerves are intact.     Motor: Weakness present.     Coordination: Coordination is intact.     Deep Tendon Reflexes: Babinski sign absent on the right side. Babinski sign absent on the left side.     Reflex Scores:      Patellar reflexes are 2+ on the right side and 2+ on the left side.      Achilles reflexes are 2+ on the right side and 2+ on the left side.    Lab Results  Component Value Date   WBC 10.0 08/11/2018   HGB 15.2 08/11/2018   HCT 46.3 (H) 08/11/2018   PLT 358 08/11/2018   GLUCOSE 94 09/24/2018   CHOL 137 08/11/2018   TRIG 223 (H) 08/11/2018   HDL 30 (L) 08/11/2018   LDLDIRECT 119.0 02/10/2018   LDLCALC 76 08/11/2018   ALT 16 08/11/2018   AST 18 08/11/2018   NA 141 09/24/2018   K 4.6 09/24/2018   CL 103 09/24/2018    CREATININE 0.84 09/24/2018   BUN 9 09/24/2018   CO2 23 09/24/2018   TSH 0.96 08/11/2018   INR 0.96 08/13/2017   HGBA1C 8.9 (H) 08/11/2018   MICROALBUR 0.4 08/11/2018    Mm Digital Screening Bilateral  Result Date: 09/29/2018  CLINICAL DATA:  Screening. EXAM: DIGITAL SCREENING BILATERAL MAMMOGRAM WITH CAD COMPARISON:  None ACR Breast Density Category b: There are scattered areas of fibroglandular density. FINDINGS: In the left breast, a possible mass warrants further evaluation. In the right breast, no findings suspicious for malignancy. Images were processed with CAD. IMPRESSION: Further evaluation is suggested for possible mass in the left breast. RECOMMENDATION: Diagnostic mammogram and possibly ultrasound of the left breast. (Code:[id number]) The patient will be contacted regarding the findings, and additional imaging will be scheduled. BI-RADS CATEGORY  0: Incomplete. Need additional imaging evaluation and/or prior mammograms for comparison. Electronically Signed   By: Audie Pinto M.D.   On: 09/29/2018 09:44    Assessment & Plan:   Lamija was seen today for follow-up.  Diagnoses and all orders for this visit:  Type 2 diabetes mellitus with hyperglycemia, without long-term current use of insulin (HCC)  Left anterior shoulder pain -     Ambulatory referral to Sports Medicine -     diclofenac sodium (VOLTAREN) 1 % GEL; Apply 2 g topically 3 (three) times daily as needed.  Claudication of left lower extremity ([place]) -     Ambulatory referral to Neurology -     Cancel: VAS [place] ABI WITH/WO TBI; Future -     VAS [place] ABI WITH/WO TBI; Future  Need for immunization against influenza -     Flu Vaccine QUAD High Dose(Fluad)  Depression, major, single episode, complete remission (HCC) -     mirtazapine (REMERON) 30 MG tablet; Take 1 tablet (30 mg total) by mouth at bedtime.  MS (multiple sclerosis) ([place])   I am having Audrey Russell start on diclofenac sodium and mirtazapine. I am  also having her maintain her acetaminophen, sodium chloride, cetirizine, fenofibrate, ezetimibe, aspirin EC, rosuvastatin, fluticasone, ProAir HFA, miconazole, citalopram, metFORMIN, glipiZIDE, blood glucose meter kit and supplies, famotidine, and metoprolol tartrate.  Meds ordered this encounter  Medications  . diclofenac sodium (VOLTAREN) 1 % GEL    Sig: Apply 2 g topically 3 (three) times daily as needed.    Dispense:  100 g    Refill:  1    Order Specific Question:   Supervising Provider    Answer:   MATTHEWS, CODY [[id number]]  . mirtazapine (REMERON) 30 MG tablet    Sig: Take 1 tablet (30 mg total) by mouth at bedtime.    Dispense:  90 tablet    Refill:  1    Order Specific Question:   Supervising Provider    Answer:   MATTHEWS, CODY [[id number]]    Problem List Items Addressed This Visit      Endocrine   DM (diabetes mellitus) ([place]) - Primary     Nervous and Auditory   MS (multiple sclerosis) ([place])     Other   Depression, major, single episode, complete remission ([place])   Relevant Medications   mirtazapine (REMERON) 30 MG tablet    Other Visit Diagnoses    Left anterior shoulder pain       Relevant Medications   diclofenac sodium (VOLTAREN) 1 % GEL   Other Relevant Orders   Ambulatory referral to Sports Medicine   Claudication of left lower extremity ([place])       Relevant Medications   mirtazapine (REMERON) 30 MG tablet   Other Relevant Orders   Ambulatory referral to Neurology   VAS [place] ABI WITH/WO TBI   Need for immunization against influenza       Relevant Orders   Flu Vaccine  QUAD High Dose(Fluad) (Completed)      Follow-up: Return in about 3 months (around 01/01/2019) for DM and, hyperlipidemia.  Wilfred Lacy, NP

## 2018-10-06 ENCOUNTER — Encounter: Payer: Self-pay | Admitting: Neurology

## 2018-10-06 ENCOUNTER — Encounter: Payer: Self-pay | Admitting: Nurse Practitioner

## 2018-10-06 ENCOUNTER — Telehealth (HOSPITAL_COMMUNITY): Payer: Self-pay | Admitting: Rehabilitation

## 2018-10-06 NOTE — Addendum Note (Signed)
Addended by: Wilfred Lacy L on: 10/06/2018 12:15 PM   Modules accepted: Orders

## 2018-10-08 ENCOUNTER — Ambulatory Visit: Payer: PPO | Admitting: Family Medicine

## 2018-10-14 ENCOUNTER — Ambulatory Visit (HOSPITAL_COMMUNITY)
Admission: RE | Admit: 2018-10-14 | Discharge: 2018-10-14 | Disposition: A | Discharge status: Home / Self Care | Payer: PPO | Source: Ambulatory Visit | Attending: Family | Admitting: Family

## 2018-10-14 ENCOUNTER — Other Ambulatory Visit: Payer: Self-pay

## 2018-10-14 DIAGNOSIS — I739 Peripheral vascular disease, unspecified: Secondary | ICD-10-CM | POA: Diagnosis not present

## 2018-10-15 ENCOUNTER — Encounter: Payer: PPO | Attending: Nurse Practitioner | Admitting: Dietician

## 2018-10-15 ENCOUNTER — Encounter: Payer: Self-pay | Admitting: Dietician

## 2018-10-15 DIAGNOSIS — E1165 Type 2 diabetes mellitus with hyperglycemia: Secondary | ICD-10-CM | POA: Insufficient documentation

## 2018-10-15 NOTE — Progress Notes (Signed)
Diabetes Self-Management Education  Visit Type: First/Initial  Appt. Start Time: 1310 Appt. End Time: 6967  10/18/2018  [name], identified by name and date of birth, is a 63 y.o. female with a diagnosis of Diabetes: Type 2.   ASSESSMENT Patient is here today alone.  She states that she needs to change her history of food intake.  History includes type 2 diabetes (about 10 year), HTN, and hypertriglyceridemia as well as bipolar disorder, MS, and COPD (history of lung cancer- tumor with part of lung removed last year). Sleeps 5-6 hours per night. A1C was 8.9% 7.21 and 8.8% 01/2018 Was 180 lbs prior to cancer and lost to 160 lbs due to surgery.  She has regained to 169 lbs.  Patient lives alone.  She is on disability. She managed a bar prior to that. She moved from [place] about 4 years ago. Height 5\' 9"  (1.753 m), weight 171 lb (77.6 kg). Body mass index is 25.25 kg/m.  Diabetes Self-Management Education - 10/15/18 1331      Visit Information   Visit Type  First/Initial      Initial Visit   Diabetes Type  Type 2    Are you currently following a meal plan?  No    Are you taking your medications as prescribed?  Yes    Date Diagnosed  2010      Health Coping   How would you rate your overall health?  Fair      Psychosocial Assessment   Self-care barriers  None    Self-management support  Doctor's office    Other persons present  Patient    Patient Concerns  Nutrition/Meal planning;Glycemic Control    Special Needs  None    Preferred Learning Style  No preference indicated    Learning Readiness  Ready    How often do you need to have someone help you when you read instructions, pamphlets, or other written materials from your doctor or pharmacy?  1 - Never    What is the last grade level you completed in school?  2 years college      Pre-Education Assessment   Patient understands the diabetes disease and treatment process.  Needs Instruction    Patient  understands incorporating nutritional management into lifestyle.  Needs Instruction    Patient undertands incorporating physical activity into lifestyle.  Needs Instruction    Patient understands using medications safely.  Needs Instruction    Patient understands monitoring blood glucose, interpreting and using results  Needs Instruction    Patient understands prevention, detection, and treatment of acute complications.  Needs Instruction    Patient understands prevention, detection, and treatment of chronic complications.  Needs Instruction    Patient understands how to develop strategies to address psychosocial issues.  Needs Instruction    Patient understands how to develop strategies to promote health/change behavior.  Needs Instruction      Complications   Last HgB A1C per patient/outside source  8.9 %   02/10/2018   How often do you check your blood sugar?  1-2 times/day    Fasting Blood glucose range (mg/dL)  70-129;130-179   125-150   Number of hypoglycemic episodes per month  0    Number of hyperglycemic episodes per week  0    Have you had a dilated eye exam in the past 12 months?  Yes    Have you had a dental exam in the past 12 months?  No    Are you  checking your feet?  Yes    How many days per week are you checking your feet?  7      Dietary Intake   Breakfast  coffee    Snack (morning)  none    Lunch  [place] and cheese sandwich on [employer], small amount mayo, fruit   2-3   Snack (afternoon)  none    Dinner  salmon, or fish or chicken breast, green vegetables, occasional brown rice or occasional beans    Snack (evening)  none    Beverage(s)  water, 100% cranberry juice, coffee with 2% milk, 1 tsp sugar      Exercise   Exercise Type  Light (walking / raking leaves)   walking   How many days per week to you exercise?  7    How many minutes per day do you exercise?  15    Total minutes per week of exercise  105      Patient Education   Previous Diabetes Education  No     Disease state   Definition of diabetes, type 1 and 2, and the diagnosis of diabetes    Nutrition management   Role of diet in the treatment of diabetes and the relationship between the three main macronutrients and blood glucose level;Food label reading, portion sizes and measuring food.;Meal timing in regards to the patients' current diabetes medication.;Information on hints to eating out and maintain blood glucose control.;Meal options for control of blood glucose level and chronic complications.;Reviewed blood glucose goals for pre and post meals and how to evaluate the patients' food intake on their blood glucose level.    Physical activity and exercise   Role of exercise on diabetes management, blood pressure control and cardiac health.;Helped patient identify appropriate exercises in relation to his/her diabetes, diabetes complications and other health issue.    Medications  Reviewed patients medication for diabetes, action, purpose, timing of dose and side effects.    Monitoring  Purpose and frequency of SMBG.;Identified appropriate SMBG and/or A1C goals.    Acute complications  Taught treatment of hypoglycemia - the 15 rule.;Discussed and identified patients' treatment of hyperglycemia.    Chronic complications  Relationship between chronic complications and blood glucose control;Identified and discussed with patient  current chronic complications;Retinopathy and reason for yearly dilated eye exams    Psychosocial adjustment  Worked with patient to identify barriers to care and solutions;Identified and addressed patients feelings and concerns about diabetes    Personal strategies to promote health  Lifestyle issues that need to be addressed for better diabetes care      Individualized Goals (developed by patient)   Nutrition  General guidelines for healthy choices and portions discussed;Follow meal plan discussed    Physical Activity  Exercise 5-7 days per week;30 minutes per day    Medications   take my medication as prescribed    Monitoring   test my blood glucose as discussed    Health Coping  discuss diabetes with (comment)   MD, RD, CDE     Post-Education Assessment   Patient understands the diabetes disease and treatment process.  Demonstrates understanding / competency    Patient understands incorporating nutritional management into lifestyle.  Demonstrates understanding / competency    Patient undertands incorporating physical activity into lifestyle.  Demonstrates understanding / competency    Patient understands using medications safely.  Demonstrates understanding / competency    Patient understands monitoring blood glucose, interpreting and using results  Demonstrates understanding / competency  Patient understands prevention, detection, and treatment of acute complications.  Demonstrates understanding / competency    Patient understands prevention, detection, and treatment of chronic complications.  Demonstrates understanding / competency    Patient understands how to develop strategies to address psychosocial issues.  Demonstrates understanding / competency    Patient understands how to develop strategies to promote health/change behavior.  Demonstrates understanding / competency      Outcomes   Expected Outcomes  Demonstrated interest in learning. Expect positive outcomes    Future DMSE  Yearly    Program Status  Completed       Individualized Plan for Diabetes Self-Management Training:   Learning Objective:  Patient will have a greater understanding of diabetes self-management. Patient education plan is to attend individual and/or group sessions per assessed needs and concerns.   Plan:   Patient Instructions  Water is a great beverage choice.  Use caution with the portion size and frequency of juice as this is high in carbohydrates.   Continue to increase your vegetable intake. Continue to stay active and aim for 30 minutes most days.  Aim for 2-3 Carb  Choices per meal (30-45 grams) +/- 1 either way  Aim for 0-1 Carbs per snack if hungry  Include protein in moderation with your meals and snacks Consider reading food labels for Total Carbohydrate of foods Continue checking BG at alternate times per day  Continue taking medication as directed by MD      Expected Outcomes:  Demonstrated interest in learning. Expect positive outcomes  Education material provided: ADA - How to Thrive: A Guide for Your Journey with Diabetes, Food label handouts, Meal plan card, Snack sheet, Carbohydrate counting sheet and Diabetes Resources, breakfast ideas, eating out tips, sample meal plan  If problems or questions, patient to contact team via:  Phone  Future DSME appointment: Yearly

## 2018-10-15 NOTE — Patient Instructions (Signed)
Water is a great beverage choice.  Use caution with the portion size and frequency of juice as this is high in carbohydrates.   Continue to increase your vegetable intake. Continue to stay active and aim for 30 minutes most days.  Aim for 2-3 Carb Choices per meal (30-45 grams) +/- 1 either way  Aim for 0-1 Carbs per snack if hungry  Include protein in moderation with your meals and snacks Consider reading food labels for Total Carbohydrate of foods Continue checking BG at alternate times per day  Continue taking medication as directed by MD

## 2018-10-26 ENCOUNTER — Ambulatory Visit
Admission: RE | Admit: 2018-10-26 | Discharge: 2018-10-26 | Disposition: A | Discharge status: Home / Self Care | Payer: PPO | Source: Ambulatory Visit | Attending: Nurse Practitioner | Admitting: Nurse Practitioner

## 2018-10-26 ENCOUNTER — Other Ambulatory Visit: Payer: Self-pay

## 2018-10-26 DIAGNOSIS — K769 Liver disease, unspecified: Secondary | ICD-10-CM

## 2018-10-26 DIAGNOSIS — K760 Fatty (change of) liver, not elsewhere classified: Secondary | ICD-10-CM | POA: Diagnosis not present

## 2018-10-26 DIAGNOSIS — Q6210 Congenital occlusion of ureter, unspecified: Secondary | ICD-10-CM | POA: Diagnosis not present

## 2018-10-26 DIAGNOSIS — Z9049 Acquired absence of other specified parts of digestive tract: Secondary | ICD-10-CM | POA: Diagnosis not present

## 2018-10-26 DIAGNOSIS — Q6211 Congenital occlusion of ureteropelvic junction: Secondary | ICD-10-CM

## 2018-10-26 DIAGNOSIS — N281 Cyst of kidney, acquired: Secondary | ICD-10-CM | POA: Diagnosis not present

## 2018-10-26 MED ORDER — GADOBENATE DIMEGLUMINE 529 MG/ML IV SOLN
20.0000 mL | Freq: Once | INTRAVENOUS | Status: AC | PRN
  Administered 2018-10-26: 20 mL via INTRAVENOUS

## 2018-10-27 ENCOUNTER — Telehealth (HOSPITAL_COMMUNITY): Payer: Self-pay

## 2018-10-27 ENCOUNTER — Telehealth (HOSPITAL_COMMUNITY): Payer: Self-pay | Admitting: Emergency Medicine

## 2018-10-27 NOTE — Telephone Encounter (Signed)
Left message on voicemail with name and callback number Constantino Starace RN Navigator Cardiac Imaging [facility] [facility] [phone] Office [phone] Cell  

## 2018-10-27 NOTE — Telephone Encounter (Signed)
Called pt and left VM regarding heart rate and metoprolol.  Left call back number

## 2018-10-28 ENCOUNTER — Ambulatory Visit (HOSPITAL_COMMUNITY): Payer: PPO

## 2018-10-28 ENCOUNTER — Ambulatory Visit (HOSPITAL_COMMUNITY)
Admission: RE | Admit: 2018-10-28 | Discharge: 2018-10-28 | Disposition: A | Discharge status: Home / Self Care | Payer: PPO | Source: Ambulatory Visit | Attending: Internal Medicine | Admitting: Internal Medicine

## 2018-10-28 ENCOUNTER — Other Ambulatory Visit: Payer: Self-pay

## 2018-10-28 ENCOUNTER — Encounter: Payer: PPO | Admitting: *Deleted

## 2018-10-28 DIAGNOSIS — R0602 Shortness of breath: Secondary | ICD-10-CM | POA: Diagnosis not present

## 2018-10-28 DIAGNOSIS — I700 Atherosclerosis of aorta: Secondary | ICD-10-CM | POA: Diagnosis not present

## 2018-10-28 DIAGNOSIS — J438 Other emphysema: Secondary | ICD-10-CM | POA: Insufficient documentation

## 2018-10-28 DIAGNOSIS — I2510 Atherosclerotic heart disease of native coronary artery without angina pectoris: Secondary | ICD-10-CM | POA: Diagnosis not present

## 2018-10-28 DIAGNOSIS — R079 Chest pain, unspecified: Secondary | ICD-10-CM | POA: Insufficient documentation

## 2018-10-28 DIAGNOSIS — Z85118 Personal history of other malignant neoplasm of bronchus and lung: Secondary | ICD-10-CM | POA: Diagnosis not present

## 2018-10-28 DIAGNOSIS — Z006 Encounter for examination for normal comparison and control in clinical research program: Secondary | ICD-10-CM

## 2018-10-28 MED ORDER — DILTIAZEM HCL 25 MG/5ML IV SOLN
5.0000 mg | Freq: Once | INTRAVENOUS | Status: AC
  Administered 2018-10-28: 15:00:00 5 mg via INTRAVENOUS
  Filled 2018-10-28: qty 5

## 2018-10-28 MED ORDER — NITROGLYCERIN 0.4 MG SL SUBL
0.8000 mg | SUBLINGUAL_TABLET | Freq: Once | SUBLINGUAL | Status: AC
  Administered 2018-10-28: 0.8 mg via SUBLINGUAL
  Filled 2018-10-28: qty 25

## 2018-10-28 MED ORDER — DILTIAZEM HCL 25 MG/5ML IV SOLN
5.0000 mg | Freq: Once | INTRAVENOUS | Status: AC
  Administered 2018-10-28: 5 mg via INTRAVENOUS
  Filled 2018-10-28: qty 5

## 2018-10-28 MED ORDER — METOPROLOL TARTRATE 5 MG/5ML IV SOLN
5.0000 mg | INTRAVENOUS | Status: AC | PRN
  Administered 2018-10-28 (×4): 5 mg via INTRAVENOUS
  Filled 2018-10-28 (×4): qty 5

## 2018-10-28 MED ORDER — METOPROLOL TARTRATE 5 MG/5ML IV SOLN
INTRAVENOUS | Status: AC
  Filled 2018-10-28: qty 15

## 2018-10-28 MED ORDER — NITROGLYCERIN 0.4 MG SL SUBL
SUBLINGUAL_TABLET | SUBLINGUAL | Status: AC
  Filled 2018-10-28: qty 2

## 2018-10-28 MED ORDER — DILTIAZEM HCL 25 MG/5ML IV SOLN
INTRAVENOUS | Status: AC
  Administered 2018-10-28: 15:00:00 5 mg via INTRAVENOUS
  Filled 2018-10-28: qty 5

## 2018-10-28 MED ORDER — IOHEXOL 350 MG/ML SOLN
100.0000 mL | Freq: Once | INTRAVENOUS | Status: AC | PRN
  Administered 2018-10-28: 15:00:00 100 mL via INTRAVENOUS

## 2018-10-30 ENCOUNTER — Ambulatory Visit: Payer: PPO | Admitting: Neurology

## 2018-11-02 ENCOUNTER — Telehealth: Payer: Self-pay | Admitting: Internal Medicine

## 2018-11-02 NOTE — Telephone Encounter (Signed)
Returned patient's phone call regarding rescheduling an appointment, left a voicemail. 

## 2018-11-03 ENCOUNTER — Other Ambulatory Visit: Payer: Self-pay

## 2018-11-03 ENCOUNTER — Inpatient Hospital Stay: Payer: PPO | Attending: Internal Medicine

## 2018-11-03 ENCOUNTER — Ambulatory Visit (HOSPITAL_COMMUNITY)
Admission: RE | Admit: 2018-11-03 | Discharge: 2018-11-03 | Disposition: A | Discharge status: Home / Self Care | Payer: PPO | Source: Ambulatory Visit | Attending: Internal Medicine | Admitting: Internal Medicine

## 2018-11-03 DIAGNOSIS — C3411 Malignant neoplasm of upper lobe, right bronchus or lung: Secondary | ICD-10-CM | POA: Diagnosis not present

## 2018-11-03 DIAGNOSIS — C3490 Malignant neoplasm of unspecified part of unspecified bronchus or lung: Secondary | ICD-10-CM

## 2018-11-03 LAB — CMP (CANCER CENTER ONLY)
ALT: 19 U/L (ref 0–44)
AST: 21 U/L (ref 15–41)
Albumin: 4.3 g/dL (ref 3.5–5.0)
Alkaline Phosphatase: 68 U/L (ref 38–126)
Anion gap: 10 (ref 5–15)
BUN: 15 mg/dL (ref 8–23)
CO2: 24 mmol/L (ref 22–32)
Calcium: 10 mg/dL (ref 8.9–10.3)
Chloride: 109 mmol/L (ref 98–111)
Creatinine: 1.12 mg/dL — ABNORMAL HIGH (ref 0.44–1.00)
GFR, Est AFR Am: >60 mL/min (ref >60)
GFR, Estimated: 53 mL/min — ABNORMAL LOW (ref >60)
Glucose, Bld: 181 mg/dL — ABNORMAL HIGH (ref 70–99)
Potassium: 4.1 mmol/L (ref 3.5–5.1)
Sodium: 143 mmol/L (ref 135–145)
Total Bilirubin: 0.3 mg/dL (ref 0.3–1.2)
Total Protein: 7.9 g/dL (ref 6.5–8.1)

## 2018-11-03 LAB — CBC WITH DIFFERENTIAL (CANCER CENTER ONLY)
Abs Immature Granulocytes: 0.04 10*3/uL (ref 0.00–0.07)
Basophils Absolute: 0.1 10*3/uL (ref 0.0–0.1)
Basophils Relative: 1 %
Eosinophils Absolute: 0.1 10*3/uL (ref 0.0–0.5)
Eosinophils Relative: 1 %
HCT: 44.8 % (ref 36.0–46.0)
Hemoglobin: 14.4 g/dL (ref 12.0–15.0)
Immature Granulocytes: 0 %
Lymphocytes Relative: 27 %
Lymphs Abs: 2.8 10*3/uL (ref 0.7–4.0)
MCH: 27.5 pg (ref 26.0–34.0)
MCHC: 32.1 g/dL (ref 30.0–36.0)
MCV: 85.7 fL (ref 80.0–100.0)
Monocytes Absolute: 0.7 10*3/uL (ref 0.1–1.0)
Monocytes Relative: 6 %
Neutro Abs: 6.6 10*3/uL (ref 1.7–7.7)
Neutrophils Relative %: 65 %
Platelet Count: 277 10*3/uL (ref 150–400)
RBC: 5.23 MIL/uL — ABNORMAL HIGH (ref 3.87–5.11)
RDW: 14.2 % (ref 11.5–15.5)
WBC Count: 10.2 10*3/uL (ref 4.0–10.5)
nRBC: 0 % (ref 0.0–0.2)

## 2018-11-03 MED ORDER — SODIUM CHLORIDE (PF) 0.9 % IJ SOLN
INTRAMUSCULAR | Status: AC
  Filled 2018-11-03: qty 50

## 2018-11-03 MED ORDER — IOHEXOL 300 MG/ML  SOLN
75.0000 mL | Freq: Once | INTRAMUSCULAR | Status: AC | PRN
  Administered 2018-11-03: 12:00:00 75 mL via INTRAVENOUS

## 2018-11-03 NOTE — Research (Signed)
CADFEM Informed Consent                  Subject Name:   Audrey Russell. Audrey Russell   Subject met inclusion and exclusion criteria.  The informed consent form, study requirements and expectations were reviewed with the subject and questions and concerns were addressed prior to the signing of the consent form.  The subject verbalized understanding of the trial requirements.  The subject agreed to participate in the CADFEM trial and signed the informed consent.  The informed consent was obtained prior to performance of any protocol-specific procedures for the subject.  A copy of the signed informed consent was given to the subject and a copy was placed in the subject's medical record.   [place] Toyna Erisman, Research Assistant 10/28/2018 13:33

## 2018-11-05 ENCOUNTER — Inpatient Hospital Stay: Payer: PPO | Admitting: Internal Medicine

## 2018-11-06 ENCOUNTER — Other Ambulatory Visit: Payer: Self-pay

## 2018-11-09 ENCOUNTER — Telehealth: Payer: Self-pay | Admitting: Internal Medicine

## 2018-11-09 NOTE — Telephone Encounter (Signed)
Returned patient's phone call regarding rescheduling an appointment, per patient's request appointment has been rescheduled for 11/02.

## 2018-11-23 ENCOUNTER — Other Ambulatory Visit: Payer: Self-pay | Admitting: Internal Medicine

## 2018-11-23 ENCOUNTER — Other Ambulatory Visit: Payer: Self-pay | Admitting: Nurse Practitioner

## 2018-11-23 ENCOUNTER — Inpatient Hospital Stay: Payer: PPO | Attending: Internal Medicine | Admitting: Physician Assistant

## 2018-11-23 ENCOUNTER — Encounter: Payer: Self-pay | Admitting: Physician Assistant

## 2018-11-23 ENCOUNTER — Other Ambulatory Visit: Payer: Self-pay

## 2018-11-23 VITALS — BP 156/96 | HR 80 | Temp 97.6°F | Resp 18 | Ht 69.0 in | Wt 170.4 lb

## 2018-11-23 DIAGNOSIS — E119 Type 2 diabetes mellitus without complications: Secondary | ICD-10-CM | POA: Insufficient documentation

## 2018-11-23 DIAGNOSIS — C3411 Malignant neoplasm of upper lobe, right bronchus or lung: Secondary | ICD-10-CM | POA: Insufficient documentation

## 2018-11-23 DIAGNOSIS — R05 Cough: Secondary | ICD-10-CM | POA: Diagnosis not present

## 2018-11-23 DIAGNOSIS — K760 Fatty (change of) liver, not elsewhere classified: Secondary | ICD-10-CM | POA: Insufficient documentation

## 2018-11-23 DIAGNOSIS — J449 Chronic obstructive pulmonary disease, unspecified: Secondary | ICD-10-CM | POA: Insufficient documentation

## 2018-11-23 DIAGNOSIS — Z902 Acquired absence of lung [part of]: Secondary | ICD-10-CM | POA: Diagnosis not present

## 2018-11-23 DIAGNOSIS — I10 Essential (primary) hypertension: Secondary | ICD-10-CM | POA: Insufficient documentation

## 2018-11-23 DIAGNOSIS — Z791 Long term (current) use of non-steroidal anti-inflammatories (NSAID): Secondary | ICD-10-CM | POA: Insufficient documentation

## 2018-11-23 DIAGNOSIS — Z7984 Long term (current) use of oral hypoglycemic drugs: Secondary | ICD-10-CM | POA: Diagnosis not present

## 2018-11-23 DIAGNOSIS — C3491 Malignant neoplasm of unspecified part of right bronchus or lung: Secondary | ICD-10-CM

## 2018-11-23 DIAGNOSIS — J439 Emphysema, unspecified: Secondary | ICD-10-CM | POA: Diagnosis not present

## 2018-11-23 DIAGNOSIS — Z79899 Other long term (current) drug therapy: Secondary | ICD-10-CM | POA: Insufficient documentation

## 2018-11-23 DIAGNOSIS — N281 Cyst of kidney, acquired: Secondary | ICD-10-CM | POA: Diagnosis not present

## 2018-11-23 DIAGNOSIS — Z87891 Personal history of nicotine dependence: Secondary | ICD-10-CM | POA: Diagnosis not present

## 2018-11-23 DIAGNOSIS — Z9049 Acquired absence of other specified parts of digestive tract: Secondary | ICD-10-CM | POA: Diagnosis not present

## 2018-11-23 DIAGNOSIS — E1169 Type 2 diabetes mellitus with other specified complication: Secondary | ICD-10-CM

## 2018-11-23 DIAGNOSIS — G35 Multiple sclerosis: Secondary | ICD-10-CM | POA: Diagnosis not present
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
## 2018-11-24 ENCOUNTER — Telehealth: Payer: Self-pay | Admitting: Internal Medicine

## 2018-11-24 NOTE — Telephone Encounter (Signed)
Scheduled per los. Called and left msg. Mailed printout  °

## 2018-11-27 ENCOUNTER — Ambulatory Visit: Payer: PPO | Admitting: Neurology

## 2018-12-07 ENCOUNTER — Ambulatory Visit
Admission: RE | Admit: 2018-12-07 | Discharge: 2018-12-07 | Disposition: A | Discharge status: Home / Self Care | Payer: PPO | Source: Ambulatory Visit | Attending: Nurse Practitioner | Admitting: Nurse Practitioner

## 2018-12-07 ENCOUNTER — Other Ambulatory Visit: Payer: Self-pay

## 2018-12-07 DIAGNOSIS — R928 Other abnormal and inconclusive findings on diagnostic imaging of breast: Secondary | ICD-10-CM | POA: Diagnosis not present

## 2018-12-07 DIAGNOSIS — N6322 Unspecified lump in the left breast, upper inner quadrant: Secondary | ICD-10-CM | POA: Diagnosis not present

## 2018-12-07 DIAGNOSIS — N6002 Solitary cyst of left breast: Secondary | ICD-10-CM | POA: Diagnosis not present

## 2019-01-01 ENCOUNTER — Other Ambulatory Visit: Payer: Self-pay

## 2019-01-01 ENCOUNTER — Ambulatory Visit: Payer: PPO | Admitting: Nurse Practitioner

## 2019-01-01 DIAGNOSIS — Z20822 Contact with and (suspected) exposure to covid-19: Secondary | ICD-10-CM

## 2019-01-02 LAB — NOVEL CORONAVIRUS, NAA: SARS-CoV-2, NAA: NOT DETECTED

## 2019-02-15 ENCOUNTER — Other Ambulatory Visit: Payer: Self-pay

## 2019-02-16 ENCOUNTER — Encounter: Payer: Self-pay | Admitting: Nurse Practitioner

## 2019-02-16 ENCOUNTER — Ambulatory Visit (INDEPENDENT_AMBULATORY_CARE_PROVIDER_SITE_OTHER): Payer: PPO | Admitting: Nurse Practitioner

## 2019-02-16 ENCOUNTER — Other Ambulatory Visit: Payer: Self-pay | Admitting: Nurse Practitioner

## 2019-02-16 VITALS — BP 124/70 | HR 69 | Temp 97.0°F | Ht 69.0 in | Wt 172.6 lb

## 2019-02-16 DIAGNOSIS — K769 Liver disease, unspecified: Secondary | ICD-10-CM

## 2019-02-16 DIAGNOSIS — I700 Atherosclerosis of aorta: Secondary | ICD-10-CM

## 2019-02-16 DIAGNOSIS — H9121 Sudden idiopathic hearing loss, right ear: Secondary | ICD-10-CM

## 2019-02-16 DIAGNOSIS — I10 Essential (primary) hypertension: Secondary | ICD-10-CM | POA: Diagnosis not present

## 2019-02-16 DIAGNOSIS — F325 Major depressive disorder, single episode, in full remission: Secondary | ICD-10-CM

## 2019-02-16 DIAGNOSIS — E1165 Type 2 diabetes mellitus with hyperglycemia: Secondary | ICD-10-CM

## 2019-02-16 DIAGNOSIS — E781 Pure hyperglyceridemia: Secondary | ICD-10-CM

## 2019-02-16 LAB — LIPID PANEL
Cholesterol: 127 mg/dL (ref 0–200)
HDL: 29.6 mg/dL — ABNORMAL LOW (ref >39.00)
NonHDL: 97.2
Total CHOL/HDL Ratio: 4
Triglycerides: 204 mg/dL — ABNORMAL HIGH (ref 0.0–149.0)
VLDL: 40.8 mg/dL — ABNORMAL HIGH (ref 0.0–40.0)

## 2019-02-16 LAB — BASIC METABOLIC PANEL
BUN: 8 mg/dL (ref 6–23)
CO2: 26 mEq/L (ref 19–32)
Calcium: 9.5 mg/dL (ref 8.4–10.5)
Chloride: 105 mEq/L (ref 96–112)
Creatinine, Ser: 0.84 mg/dL (ref 0.40–1.20)
GFR: 82.83 mL/min (ref >60.00)
Glucose, Bld: 116 mg/dL — ABNORMAL HIGH (ref 70–99)
Potassium: 3.9 mEq/L (ref 3.5–5.1)
Sodium: 138 mEq/L (ref 135–145)

## 2019-02-16 LAB — HEMOGLOBIN A1C: Hgb A1c MFr Bld: 7.7 % — ABNORMAL HIGH (ref 4.6–6.5)

## 2019-02-16 LAB — LDL CHOLESTEROL, DIRECT: Direct LDL: 67 mg/dL

## 2019-02-16 MED ORDER — MIRTAZAPINE 30 MG PO TABS: 30.0000 mg | ORAL_TABLET | Freq: Every day | ORAL | 1 refills | Status: DC

## 2019-02-16 MED ORDER — GLIPIZIDE ER 5 MG PO TB24: 5.0000 mg | ORAL_TABLET | Freq: Every day | ORAL | 1 refills | Status: DC

## 2019-02-16 MED ORDER — METFORMIN HCL 850 MG PO TABS: 850.0000 mg | ORAL_TABLET | Freq: Two times a day (BID) | ORAL | 5 refills | Status: DC

## 2019-02-16 MED ORDER — FENOFIBRATE 160 MG PO TABS: 160.0000 mg | ORAL_TABLET | Freq: Every day | ORAL | 3 refills | Status: DC

## 2019-02-16 MED ORDER — EZETIMIBE 10 MG PO TABS: 10.0000 mg | ORAL_TABLET | Freq: Every evening | ORAL | 3 refills | Status: DC

## 2019-02-16 NOTE — Assessment & Plan Note (Signed)
BP at goal 

## 2019-02-16 NOTE — Assessment & Plan Note (Signed)
LDL not at goal due to elevated trig. Current use of crestor, zetia and fenofibrate. Controlled Dm and BP at goal. She is also under care of cardiology: Dr. Harrington Challenger. CT coronary 10/2018:Coronary calcium score of 157. This was 60 percentile for age and sex matched control. Normal coronary origin with right dominance. Descending aortic atherosclerosis (grade 2). Mid to distal RCA calcified plaque with luminal narrowing of 25-49% CAD-RADS 2 (non flow limiting). Consider preventive therapy and risk factor modification.  Continue f/up with Dr. Harrington Challenger

## 2019-02-16 NOTE — Assessment & Plan Note (Signed)
repeat MRI ABD with Eovist enhanced MRI done 10/2018: stable hepatic lesion-48mm (benign) and right UPJ stenosis.

## 2019-02-16 NOTE — Progress Notes (Addendum)
Subjective:  Patient ID: Audrey Russell, female    DOB: [birth year]  Age: 64 y.o. MRN: [id number]  CC: Follow-up (6 mo follow up/fasting/ righ ear stop up/ BS at home run around 125-150 daily)  HPI  Reports sudden right hearing loss, denies any trauma or tinnitus or acute URI.  HTN: BP at goal BP Readings from Last 3 Encounters:  02/16/19 124/70  11/23/18 (!) 156/96  10/28/18 135/81   DM: Glucose control with metformin and glipizide No hypoglycemic episodes, no LE edema. Negative urine microalbumin Up to date with eye exam Positive neuropathy. Had appt with nutritionist. LDL not at goal due to elevated trig. Current use of crestor. She did not make appt with lipid as previously discussed. She is also under care of cardiology: Dr. Harrington Challenger. CT coronary 10/2018:Coronary calcium score of 157. This was 74 percentile for age and sex matched control. Normal coronary origin with right dominance. Descending aortic atherosclerosis (grade 2). Mid to distal RCA calcified plaque with luminal narrowing of 25-49% CAD-RADS 2 (non flow limiting). Consider preventive therapy and risk factor modification.  Depression: Stable mood with citalopram and remeron Depression screen [facility] 2/9 02/16/2019 02/16/2019 10/15/2018  Decreased Interest 0 0 0  Down, Depressed, Hopeless 2 0 0  PHQ - 2 Score 2 0 0  Altered sleeping 2 - -  Tired, decreased energy 1 - -  Change in appetite 1 - -  Feeling bad or failure about yourself  0 - -  Trouble concentrating 0 - -  Moving slowly or fidgety/restless 0 - -  Suicidal thoughts 0 - -  PHQ-9 Score 6 - -   GAD 7 : Generalized Anxiety Score 02/16/2019  Nervous, Anxious, on Edge 0  Control/stop worrying 0  Worry too much - different things 0  Trouble relaxing 0  Restless 0  Easily annoyed or irritable 0  Afraid - awful might happen 0  Total GAD 7 Score 0   COPD-emphysema, Pulmonary infiltrate with hx of lung nodule: S/p lobectomy Under the care of  pulmonology (Dr. Melvyn Novas) and oncology (Dr. Earlie Server) Schedule for repeat Ct chest. Current use of albuterol prn.  Reviewed past Medical, Social and Family history today.  Outpatient Medications Prior to Visit  Medication Sig Dispense Refill  . acetaminophen (TYLENOL) 500 MG tablet Take 1,000 mg by mouth every 6 (six) hours as needed for mild pain or moderate pain.    Marland Kitchen aspirin EC 81 MG tablet Take 1 tablet (81 mg total) by mouth daily. 90 tablet 3  . cetirizine (ZYRTEC) 10 MG tablet TAKE 1 TABLET BY MOUTH EVERYDAY AT BEDTIME 30 tablet 1  . citalopram (CELEXA) 40 MG tablet Take 1 tablet (40 mg total) by mouth daily. 90 tablet 3  . diclofenac sodium (VOLTAREN) 1 % GEL Apply 2 g topically 3 (three) times daily as needed. 100 g 1  . famotidine (PEPCID) 20 MG tablet Take 20 mg by mouth 2 (two) times daily.    . fluticasone (FLONASE) 50 MCG/ACT nasal spray SPRAY 2 SPRAYS INTO EACH NOSTRIL EVERY DAY 16 mL 0  . metoprolol tartrate (LOPRESSOR) 100 MG tablet Take one tablet by mouth 2 hours prior to cardiac ct 1 tablet 0  . miconazole (MICOTIN) 2 % powder Apply topically 2 (two) times a day. 70 g 3  . PROAIR HFA 108 (90 Base) MCG/ACT inhaler TAKE 2 PUFFS BY MOUTH EVERY 6 HOURS AS NEEDED FOR WHEEZE OR SHORTNESS OF BREATH 8.5 g 5  . rosuvastatin (CRESTOR) 10 MG  tablet Take 1 tablet (10 mg total) by mouth daily. 90 tablet 3  . sodium chloride (OCEAN) 0.65 % SOLN nasal spray Place 1 spray into both nostrils as needed for congestion. 15 mL 0  . ezetimibe (ZETIA) 10 MG tablet Take 1 tablet (10 mg total) by mouth every evening. 90 tablet 3  . fenofibrate 160 MG tablet Take 1 tablet (160 mg total) by mouth daily. 90 tablet 3  . glipiZIDE (GLUCOTROL XL) 5 MG 24 hr tablet Take 1 tablet (5 mg total) by mouth daily after supper. 90 tablet 1  . metFORMIN (GLUCOPHAGE) 850 MG tablet Take 1 tablet (850 mg total) by mouth 2 (two) times daily with a meal. 60 tablet 5  . mirtazapine (REMERON) 30 MG tablet Take 1 tablet (30  mg total) by mouth at bedtime. 90 tablet 1  . blood glucose meter kit and supplies KIT Dispense based on patient and insurance preference. Use before breakfast  And before supper (E11.9). (Patient not taking: Reported on 02/16/2019) 1 each 0   No facility-administered medications prior to visit.    ROS See HPI  Objective:  BP 124/70   Pulse 69   Temp (!) 97 F (36.1 C) (Tympanic)   Ht _0  (1.753 m)   Wt 172 lb 9.6 oz (78.3 kg)   SpO2 92%   BMI 25.49 kg/m   BP Readings from Last 3 Encounters:  02/16/19 124/70  11/23/18 (!) 156/96  10/28/18 135/81    Wt Readings from Last 3 Encounters:  02/16/19 172 lb 9.6 oz (78.3 kg)  11/23/18 170 lb 6.4 oz (77.3 kg)  10/15/18 171 lb (77.6 kg)    Physical Exam HENT:     Head:     Jaw: There is normal jaw occlusion.     Comments: No mastoid tenderness    Right Ear: Tympanic membrane, ear canal and external ear normal.     Left Ear: Tympanic membrane, ear canal and external ear normal.     Ears:     Weber exam findings: does not lateralize.    Right Rinne: BC > AC.    Left Rinne: AC > BC. Neck:     Thyroid: No thyroid mass, thyromegaly or thyroid tenderness.  Cardiovascular:     Rate and Rhythm: Normal rate and regular rhythm.     Pulses: Normal pulses.     Heart sounds: Normal heart sounds.  Pulmonary:     Effort: Pulmonary effort is normal. No respiratory distress.     Breath sounds: Wheezing and rhonchi present.  Abdominal:     General: Bowel sounds are normal.     Palpations: Abdomen is soft.  Musculoskeletal:     Right lower leg: No edema.     Left lower leg: No edema.  Lymphadenopathy:     Cervical:     Right cervical: No superficial, deep or posterior cervical adenopathy.    Left cervical: No superficial, deep or posterior cervical adenopathy.  Skin:    General: Skin is warm and dry.     Comments: Completed diabetic foot exam  Neurological:     Mental Status: She is alert and oriented to person, place, and time.      Lab Results  Component Value Date   WBC 10.2 11/03/2018   HGB 14.4 11/03/2018   HCT 44.8 11/03/2018   PLT 277 11/03/2018   GLUCOSE 116 (H) 02/16/2019   CHOL 127 02/16/2019   TRIG 204.0 (H) 02/16/2019   HDL 29.60 (L) 02/16/2019  LDLDIRECT 67.0 02/16/2019   LDLCALC 76 08/11/2018   ALT 19 11/03/2018   AST 21 11/03/2018   NA 138 02/16/2019   K 3.9 02/16/2019   CL 105 02/16/2019   CREATININE 0.84 02/16/2019   BUN 8 02/16/2019   CO2 26 02/16/2019   TSH 0.96 08/11/2018   INR 0.96 08/13/2017   HGBA1C 7.7 (H) 02/16/2019   MICROALBUR 0.4 08/11/2018    Assessment & Plan:  This visit occurred during the SARS-CoV-2 public health emergency.  Safety protocols were in place, including screening questions prior to the visit, additional usage of staff PPE, and extensive cleaning of exam room while observing appropriate contact time as indicated for disinfecting solutions.   Kayler was seen today for follow-up.  Diagnoses and all orders for this visit:  Essential hypertension -     Basic metabolic panel  Type 2 diabetes mellitus with hyperglycemia, without long-term current use of insulin (HCC) -     Hemoglobin A1c -     glipiZIDE (GLUCOTROL XL) 5 MG 24 hr tablet; Take 1 tablet (5 mg total) by mouth daily after supper. -     metFORMIN (GLUCOPHAGE) 850 MG tablet; Take 1 tablet (850 mg total) by mouth 2 (two) times daily with a meal.  Depression, major, single episode, complete remission (HCC) -     mirtazapine (REMERON) 30 MG tablet; Take 1 tablet (30 mg total) by mouth at bedtime.  Hypertriglyceridemia -     Lipid panel -     LDL cholesterol, direct -     ezetimibe (ZETIA) 10 MG tablet; Take 1 tablet (10 mg total) by mouth every evening. -     fenofibrate 160 MG tablet; Take 1 tablet (160 mg total) by mouth daily.  Liver disease  Atherosclerosis of aorta ([place])  Sudden right hearing loss -     Ambulatory referral to Audiology   I am having Tomi Bamberger L. Tidd maintain  her acetaminophen, sodium chloride, cetirizine, aspirin EC, rosuvastatin, fluticasone, ProAir HFA, miconazole, citalopram, blood glucose meter kit and supplies, famotidine, metoprolol tartrate, diclofenac sodium, ezetimibe, fenofibrate, glipiZIDE, metFORMIN, and mirtazapine.  Meds ordered this encounter  Medications  . ezetimibe (ZETIA) 10 MG tablet    Sig: Take 1 tablet (10 mg total) by mouth every evening.    Dispense:  90 tablet    Refill:  3    Order Specific Question:   Supervising Provider    Answer:   Lucille Passy [[id number]]  . fenofibrate 160 MG tablet    Sig: Take 1 tablet (160 mg total) by mouth daily.    Dispense:  90 tablet    Refill:  3    Order Specific Question:   Supervising Provider    Answer:   Lucille Passy [[id number]]  . glipiZIDE (GLUCOTROL XL) 5 MG 24 hr tablet    Sig: Take 1 tablet (5 mg total) by mouth daily after supper.    Dispense:  90 tablet    Refill:  1    Order Specific Question:   Supervising Provider    Answer:   Lucille Passy [[id number]]  . metFORMIN (GLUCOPHAGE) 850 MG tablet    Sig: Take 1 tablet (850 mg total) by mouth 2 (two) times daily with a meal.    Dispense:  60 tablet    Refill:  5    Order Specific Question:   Supervising Provider    Answer:   Lucille Passy [[id number]]  . mirtazapine (REMERON) 30 MG tablet  Sig: Take 1 tablet (30 mg total) by mouth at bedtime.    Dispense:  90 tablet    Refill:  1    Order Specific Question:   Supervising Provider    Answer:   Lucille Passy [[id number]]    Problem List Items Addressed This Visit      Cardiovascular and Mediastinum   Atherosclerosis of aorta (HCC)    LDL not at goal due to elevated trig. Current use of crestor, zetia and fenofibrate. Controlled Dm and BP at goal. She is also under care of cardiology: Dr. Harrington Challenger. CT coronary 10/2018:Coronary calcium score of 157. This was 31 percentile for age and sex matched control. Normal coronary origin with right dominance. Descending aortic atherosclerosis (grade  2). Mid to distal RCA calcified plaque with luminal narrowing of 25-49% CAD-RADS 2 (non flow limiting). Consider preventive therapy and risk factor modification.  Continue f/up with Dr. Harrington Challenger      Relevant Medications   ezetimibe (ZETIA) 10 MG tablet   fenofibrate 160 MG tablet   Essential hypertension - Primary    BP at goal      Relevant Medications   ezetimibe (ZETIA) 10 MG tablet   fenofibrate 160 MG tablet   Other Relevant Orders   Basic metabolic panel (Completed)     Digestive   Liver disease    repeat MRI ABD with Eovist enhanced MRI done 10/2018: stable hepatic lesion-41m (benign) and right UPJ stenosis.        Endocrine   DM (diabetes mellitus) (HCC)   Relevant Medications   glipiZIDE (GLUCOTROL XL) 5 MG 24 hr tablet   metFORMIN (GLUCOPHAGE) 850 MG tablet   Other Relevant Orders   Hemoglobin A1c (Completed)     Other   Depression, major, single episode, complete remission (HCC)   Relevant Medications   mirtazapine (REMERON) 30 MG tablet   Hypertriglyceridemia   Relevant Medications   ezetimibe (ZETIA) 10 MG tablet   fenofibrate 160 MG tablet   Other Relevant Orders   Lipid panel (Completed)   LDL cholesterol, direct (Completed)    Other Visit Diagnoses    Sudden right hearing loss       Relevant Orders   Ambulatory referral to Audiology       Follow-up: Return in about 6 months (around 08/16/2019) for DM and HTN, hyperlipidemia (F2F, fasting).  CWilfred Lacy NP

## 2019-02-16 NOTE — Patient Instructions (Addendum)
Improved Hgb A1c 8.9 to 7.7 Continue glipizide and metformin Abnormal lipid panel:trig continues to improve but still elevated. Continue zetia, fenofibrate and crestor. Schedule appt with cardiology and lipid clinic. Stable renal function  Due to neuropathy, maintain proper fitting shoes, daily foot check, moisturizing skin and adequate oral hydration.  Please sign medical release to get eye exam report.

## 2019-02-16 NOTE — Addendum Note (Signed)
Addended by: Wilfred Lacy L on: 02/16/2019 09:00 PM   Modules accepted: Orders

## 2019-03-02 ENCOUNTER — Encounter: Payer: Self-pay | Admitting: Nurse Practitioner

## 2019-03-02 NOTE — Progress Notes (Signed)
Abstracted result and sent to scan  

## 2019-03-04 ENCOUNTER — Telehealth: Payer: Self-pay | Admitting: Internal Medicine

## 2019-03-04 NOTE — Telephone Encounter (Signed)

## 2019-03-05 ENCOUNTER — Encounter: Payer: Self-pay | Admitting: Internal Medicine

## 2019-03-05 ENCOUNTER — Telehealth (INDEPENDENT_AMBULATORY_CARE_PROVIDER_SITE_OTHER): Payer: PPO | Admitting: Internal Medicine

## 2019-03-05 ENCOUNTER — Telehealth: Payer: Self-pay | Admitting: Licensed Clinical Social Worker

## 2019-03-05 DIAGNOSIS — I2510 Atherosclerotic heart disease of native coronary artery without angina pectoris: Secondary | ICD-10-CM

## 2019-03-05 DIAGNOSIS — E782 Mixed hyperlipidemia: Secondary | ICD-10-CM

## 2019-03-05 NOTE — Patient Instructions (Signed)
Medication Instructions:  Your physician recommends that you continue on your current medications as directed. Please refer to the Current Medication list given to you today.  *If you need a refill on your cardiac medications before your next appointment, please call your pharmacy*  Lab Work: none If you have labs (blood work) drawn today and your tests are completely normal, you will receive your results only by: Marland Kitchen MyChart Message (if you have MyChart) OR . A paper copy in the mail If you have any lab test that is abnormal or we need to change your treatment, we will call you to review the results.  Testing/Procedures: none  Follow-Up: At [facility], you and your health needs are our priority.  As part of our continuing mission to provide you with exceptional heart care, we have created designated Provider Care Teams.  These Care Teams include your primary Cardiologist (physician) and Advanced Practice Providers (APPs -  Physician Assistants and Nurse Practitioners) who all work together to provide you with the care you need, when you need it.  Your next appointment:   12 month(s)  The format for your next appointment:   In Person  Provider:   Dorris Carnes, MD  Other Instructions None     Thank you for choosing [facility] !

## 2019-03-05 NOTE — Telephone Encounter (Signed)
CSW referred to assist patient with obtaining a scale and BP cuff. CSW contacted patient to inform scale and cuff will be delivered to home. Patient grateful for support and assistance. CSW available as needed. Jackie Shabree Tebbetts, LCSW, CCSW-MCS [phone]  
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
## 2019-03-09 DIAGNOSIS — H90A31 Mixed conductive and sensorineural hearing loss, unilateral, right ear with restricted hearing on the contralateral side: Secondary | ICD-10-CM | POA: Diagnosis not present

## 2019-03-09 DIAGNOSIS — H90A22 Sensorineural hearing loss, unilateral, left ear, with restricted hearing on the contralateral side: Secondary | ICD-10-CM | POA: Diagnosis not present
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
## 2019-03-31 ENCOUNTER — Ambulatory Visit (INDEPENDENT_AMBULATORY_CARE_PROVIDER_SITE_OTHER): Payer: PPO | Admitting: *Deleted

## 2019-03-31 ENCOUNTER — Ambulatory Visit: Payer: PPO | Admitting: *Deleted

## 2019-03-31 ENCOUNTER — Encounter: Payer: Self-pay | Admitting: *Deleted

## 2019-03-31 ENCOUNTER — Other Ambulatory Visit: Payer: Self-pay | Admitting: Nurse Practitioner

## 2019-03-31 VITALS — BP 120/60 | Wt 167.0 lb

## 2019-03-31 DIAGNOSIS — Z0000 Encounter for general adult medical examination without abnormal findings: Secondary | ICD-10-CM

## 2019-03-31 DIAGNOSIS — H6993 Unspecified Eustachian tube disorder, bilateral: Secondary | ICD-10-CM

## 2019-03-31 MED ORDER — FLUTICASONE PROPIONATE 50 MCG/ACT NA SUSP: NASAL | 2 refills | Status: DC

## 2019-03-31 MED ORDER — FAMOTIDINE 20 MG PO TABS: 20.0000 mg | ORAL_TABLET | Freq: Every day | ORAL | 0 refills | Status: DC

## 2019-03-31 NOTE — Telephone Encounter (Signed)
Pt request refill for Flonase and Pepcid. Please advise Flonase last sent in was 06/2018 and we never send in [place] before.

## 2019-03-31 NOTE — Telephone Encounter (Signed)
Both Rx sent. Pt is aware.

## 2019-03-31 NOTE — Patient Instructions (Signed)
Please schedule your next medicare wellness visit with me in 1 yr.  Continue to eat heart healthy diet (full of fruits, vegetables, whole grains, lean protein, water--limit salt, fat, and sugar intake) and increase physical activity as tolerated.  Continue doing brain stimulating activities (puzzles, reading, adult coloring books, staying active) to keep memory sharp.    Audrey Russell , Thank you for taking time to come for your Medicare Wellness Visit. I appreciate your ongoing commitment to your health goals. Please review the following plan we discussed and let me know if I can assist you in the future.   These are the goals we discussed: Goals    . Increase physical activity       This is a list of the screening recommended for you and due dates:  Health Maintenance  Topic Date Due  . Eye exam for diabetics  12/24/2018  . HIV Screening  08/12/2019*  . Urine Protein Check  08/11/2019  . Hemoglobin A1C  08/16/2019  . Complete foot exam   02/16/2020  . Colon Cancer Screening  05/07/2020  . Mammogram  09/24/2020  . Tetanus Vaccine  01/29/2027  . Flu Shot  Completed  . Pneumococcal vaccine  Completed  .  Hepatitis C: One time screening is recommended by Center for Disease Control  (CDC) for  adults born from [birth year] through 1965.   Completed  *Topic was postponed. The date shown is not the original due date.    Preventive Care 70-71 Years Old, Female Preventive care refers to visits with your health care provider and lifestyle choices that can promote health and wellness. This includes:  A yearly physical exam. This may also be called an annual well check.  Regular dental visits and eye exams.  Immunizations.  Screening for certain conditions.  Healthy lifestyle choices, such as eating a healthy diet, getting regular exercise, not using drugs or products that contain nicotine and tobacco, and limiting alcohol use. What can I expect for my preventive care visit? Physical  exam Your health care provider will check your:  Height and weight. This may be used to calculate body mass index (BMI), which tells if you are at a healthy weight.  Heart rate and blood pressure.  Skin for abnormal spots. Counseling Your health care provider may ask you questions about your:  Alcohol, tobacco, and drug use.  Emotional well-being.  Home and relationship well-being.  Sexual activity.  Eating habits.  Work and work Statistician.  Method of birth control.  Menstrual cycle.  Pregnancy history. What immunizations do I need?  Influenza (flu) vaccine  This is recommended every year. Tetanus, diphtheria, and pertussis (Tdap) vaccine  You may need a Td booster every 10 years. Varicella (chickenpox) vaccine  You may need this if you have not been vaccinated. Zoster (shingles) vaccine  You may need this after age 25. Measles, mumps, and rubella (MMR) vaccine  You may need at least one dose of MMR if you were born in 1957 or later. You may also need a second dose. Pneumococcal conjugate (PCV13) vaccine  You may need this if you have certain conditions and were not previously vaccinated. Pneumococcal polysaccharide (PPSV23) vaccine  You may need one or two doses if you smoke cigarettes or if you have certain conditions. Meningococcal conjugate (MenACWY) vaccine  You may need this if you have certain conditions. Hepatitis A vaccine  You may need this if you have certain conditions or if you travel or work in places where you  may be exposed to hepatitis A. Hepatitis B vaccine  You may need this if you have certain conditions or if you travel or work in places where you may be exposed to hepatitis B. Haemophilus influenzae type b (Hib) vaccine  You may need this if you have certain conditions. Human papillomavirus (HPV) vaccine  If recommended by your health care provider, you may need three doses over 6 months. You may receive vaccines as individual  doses or as more than one vaccine together in one shot (combination vaccines). Talk with your health care provider about the risks and benefits of combination vaccines. What tests do I need? Blood tests  Lipid and cholesterol levels. These may be checked every 5 years, or more frequently if you are over 17 years old.  Hepatitis C test.  Hepatitis B test. Screening  Lung cancer screening. You may have this screening every year starting at age 58 if you have a 30-pack-year history of smoking and currently smoke or have quit within the past 15 years.  Colorectal cancer screening. All adults should have this screening starting at age 79 and continuing until age 15. Your health care provider may recommend screening at age 34 if you are at increased risk. You will have tests every 1-10 years, depending on your results and the type of screening test.  Diabetes screening. This is done by checking your blood sugar (glucose) after you have not eaten for a while (fasting). You may have this done every 1-3 years.  Mammogram. This may be done every 1-2 years. Talk with your health care provider about when you should start having regular mammograms. This may depend on whether you have a family history of breast cancer.  BRCA-related cancer screening. This may be done if you have a family history of breast, ovarian, tubal, or peritoneal cancers.  Pelvic exam and Pap test. This may be done every 3 years starting at age 76. Starting at age 48, this may be done every 5 years if you have a Pap test in combination with an HPV test. Other tests  Sexually transmitted disease (STD) testing.  Bone density scan. This is done to screen for osteoporosis. You may have this scan if you are at high risk for osteoporosis. Follow these instructions at home: Eating and drinking  Eat a diet that includes fresh fruits and vegetables, whole grains, lean protein, and low-fat dairy.  Take vitamin and mineral supplements  as recommended by your health care provider.  Do not drink alcohol if: ? Your health care provider tells you not to drink. ? You are pregnant, may be pregnant, or are planning to become pregnant.  If you drink alcohol: ? Limit how much you have to 0-1 drink a day. ? Be aware of how much alcohol is in your drink. In the [place] one drink equals one 12 oz bottle of beer (355 mL), one 5 oz glass of wine (148 mL), or one 1 oz glass of hard liquor (44 mL). Lifestyle  Take daily care of your teeth and gums.  Stay active. Exercise for at least 30 minutes on 5 or more days each week.  Do not use any products that contain nicotine or tobacco, such as cigarettes, e-cigarettes, and chewing tobacco. If you need help quitting, ask your health care provider.  If you are sexually active, practice safe sex. Use a condom or other form of birth control (contraception) in order to prevent pregnancy and STIs (sexually transmitted infections).  If told  by your health care provider, take low-dose aspirin daily starting at age 21. What's next?  Visit your health care provider once a year for a well check visit.  Ask your health care provider how often you should have your eyes and teeth checked.  Stay up to date on all vaccines. This information is not intended to replace advice given to you by your health care provider. Make sure you discuss any questions you have with your health care provider. Document Revised: 09/18/2017 Document Reviewed: 09/18/2017 Elsevier Patient Education  2020 [employer].

## 2019-03-31 NOTE — Telephone Encounter (Signed)
Ok to send both Pepcid should be 20mg  daily, #90 only

## 2019-04-15 DIAGNOSIS — J343 Hypertrophy of nasal turbinates: Secondary | ICD-10-CM | POA: Diagnosis not present

## 2019-04-15 DIAGNOSIS — J310 Chronic rhinitis: Secondary | ICD-10-CM | POA: Diagnosis not present

## 2019-04-15 DIAGNOSIS — H6981 Other specified disorders of Eustachian tube, right ear: Secondary | ICD-10-CM | POA: Diagnosis not present

## 2019-04-15 DIAGNOSIS — H9011 Conductive hearing loss, unilateral, right ear, with unrestricted hearing on the contralateral side: Secondary | ICD-10-CM | POA: Diagnosis not present

## 2019-04-15 DIAGNOSIS — J342 Deviated nasal septum: Secondary | ICD-10-CM | POA: Diagnosis not present

## 2019-04-21 ENCOUNTER — Telehealth: Payer: Self-pay | Admitting: Nurse Practitioner

## 2019-04-21 NOTE — Progress Notes (Signed)
  Chronic Care Management   Outreach Note  04/21/2019 Name: Whitleigh Garramone MRN: [id number] DOB: 1955-05-03  Referred by: Flossie Buffy, NP Reason for referral : No chief complaint on file.   An unsuccessful telephone outreach was attempted today. The patient was referred to the pharmacist for assistance with care management and care coordination.   Follow Up Plan:   Earney [facility] Scheduler

## 2019-04-22 DIAGNOSIS — H6981 Other specified disorders of Eustachian tube, right ear: Secondary | ICD-10-CM | POA: Diagnosis not present

## 2019-04-22 DIAGNOSIS — H9011 Conductive hearing loss, unilateral, right ear, with unrestricted hearing on the contralateral side: Secondary | ICD-10-CM | POA: Diagnosis not present

## 2019-04-22 DIAGNOSIS — H6521 Chronic serous otitis media, right ear: Secondary | ICD-10-CM | POA: Diagnosis not present

## 2019-04-27 ENCOUNTER — Other Ambulatory Visit: Payer: Self-pay | Admitting: Nurse Practitioner

## 2019-04-27 ENCOUNTER — Telehealth: Payer: Self-pay | Admitting: Nurse Practitioner

## 2019-04-27 DIAGNOSIS — H6993 Unspecified Eustachian tube disorder, bilateral: Secondary | ICD-10-CM

## 2019-04-27 NOTE — Progress Notes (Signed)
  Chronic Care Management   Outreach Note  04/27/2019 Name: Audrey Russell MRN: [id number] DOB: Mar 06, 1955  Referred by: Flossie Buffy, NP Reason for referral : No chief complaint on file.   An unsuccessful telephone outreach was attempted today. The patient was referred to the pharmacist for assistance with care management and care coordination.   Follow Up Plan:   Earney Hamburg Upstream Scheduler

## 2019-05-04 ENCOUNTER — Telehealth: Payer: Self-pay | Admitting: Nurse Practitioner

## 2019-05-04 NOTE — Progress Notes (Signed)
  Chronic Care Management   Outreach Note  05/04/2019 Name: Audrey Russell MRN: [id number] DOB: [birth year]  Referred by: Flossie Buffy, NP Reason for referral : No chief complaint on file.   An unsuccessful telephone outreach was attempted today. The patient was referred to the pharmacist for assistance with care management and care coordination.   Follow Up Plan:   Earney Hamburg Upstream Scheduler

## 2019-05-07 ENCOUNTER — Emergency Department (HOSPITAL_COMMUNITY): Payer: PPO

## 2019-05-07 ENCOUNTER — Telehealth (INDEPENDENT_AMBULATORY_CARE_PROVIDER_SITE_OTHER): Payer: PPO | Admitting: Nurse Practitioner

## 2019-05-07 ENCOUNTER — Encounter: Payer: Self-pay | Admitting: Nurse Practitioner

## 2019-05-07 ENCOUNTER — Other Ambulatory Visit: Payer: Self-pay

## 2019-05-07 ENCOUNTER — Ambulatory Visit (HOSPITAL_COMMUNITY)
Admission: EM | Admit: 2019-05-07 | Discharge: 2019-05-07 | Disposition: A | Discharge status: Home / Self Care | Payer: PPO | Source: Home / Self Care

## 2019-05-07 ENCOUNTER — Encounter (HOSPITAL_COMMUNITY): Payer: Self-pay | Admitting: Emergency Medicine

## 2019-05-07 ENCOUNTER — Emergency Department (HOSPITAL_COMMUNITY)
Admission: EM | Admit: 2019-05-07 | Discharge: 2019-05-07 | Disposition: A | Discharge status: Home / Self Care | Payer: PPO | Attending: Emergency Medicine | Admitting: Emergency Medicine

## 2019-05-07 VITALS — Ht 69.0 in | Wt 164.0 lb

## 2019-05-07 DIAGNOSIS — Z20822 Contact with and (suspected) exposure to covid-19: Secondary | ICD-10-CM | POA: Insufficient documentation

## 2019-05-07 DIAGNOSIS — E119 Type 2 diabetes mellitus without complications: Secondary | ICD-10-CM | POA: Diagnosis not present

## 2019-05-07 DIAGNOSIS — J9801 Acute bronchospasm: Secondary | ICD-10-CM | POA: Diagnosis not present

## 2019-05-07 DIAGNOSIS — R0602 Shortness of breath: Secondary | ICD-10-CM | POA: Diagnosis not present

## 2019-05-07 DIAGNOSIS — I10 Essential (primary) hypertension: Secondary | ICD-10-CM | POA: Diagnosis not present

## 2019-05-07 DIAGNOSIS — J441 Chronic obstructive pulmonary disease with (acute) exacerbation: Secondary | ICD-10-CM | POA: Insufficient documentation

## 2019-05-07 DIAGNOSIS — R61 Generalized hyperhidrosis: Secondary | ICD-10-CM | POA: Diagnosis not present

## 2019-05-07 DIAGNOSIS — Z79899 Other long term (current) drug therapy: Secondary | ICD-10-CM | POA: Insufficient documentation

## 2019-05-07 DIAGNOSIS — Z87891 Personal history of nicotine dependence: Secondary | ICD-10-CM | POA: Diagnosis not present

## 2019-05-07 DIAGNOSIS — Z7984 Long term (current) use of oral hypoglycemic drugs: Secondary | ICD-10-CM | POA: Diagnosis not present

## 2019-05-07 DIAGNOSIS — Z7982 Long term (current) use of aspirin: Secondary | ICD-10-CM | POA: Insufficient documentation

## 2019-05-07 LAB — BASIC METABOLIC PANEL
Anion gap: 12 (ref 5–15)
BUN: 13 mg/dL (ref 8–23)
CO2: 20 mmol/L — ABNORMAL LOW (ref 22–32)
Calcium: 9.6 mg/dL (ref 8.9–10.3)
Chloride: 109 mmol/L (ref 98–111)
Creatinine, Ser: 0.89 mg/dL (ref 0.44–1.00)
GFR calc Af Amer: >60 mL/min (ref >60)
GFR calc non Af Amer: >60 mL/min (ref >60)
Glucose, Bld: 148 mg/dL — ABNORMAL HIGH (ref 70–99)
Potassium: 3.9 mmol/L (ref 3.5–5.1)
Sodium: 141 mmol/L (ref 135–145)

## 2019-05-07 LAB — CBC
HCT: 48 % — ABNORMAL HIGH (ref 36.0–46.0)
Hemoglobin: 15.4 g/dL — ABNORMAL HIGH (ref 12.0–15.0)
MCH: 27.8 pg (ref 26.0–34.0)
MCHC: 32.1 g/dL (ref 30.0–36.0)
MCV: 86.6 fL (ref 80.0–100.0)
Platelets: 309 10*3/uL (ref 150–400)
RBC: 5.54 MIL/uL — ABNORMAL HIGH (ref 3.87–5.11)
RDW: 14.4 % (ref 11.5–15.5)
WBC: 10.4 10*3/uL (ref 4.0–10.5)
nRBC: 0 % (ref 0.0–0.2)

## 2019-05-07 LAB — POC SARS CORONAVIRUS 2 AG -  ED: SARS Coronavirus 2 Ag: NEGATIVE

## 2019-05-07 LAB — SARS CORONAVIRUS 2 (TAT 6-24 HRS): SARS Coronavirus 2: NEGATIVE

## 2019-05-07 MED ORDER — MAGNESIUM SULFATE 2 GM/50ML IV SOLN
2.0000 g | Freq: Once | INTRAVENOUS | Status: AC
  Administered 2019-05-07: 2 g via INTRAVENOUS
  Filled 2019-05-07: qty 50

## 2019-05-07 MED ORDER — ALBUTEROL SULFATE HFA 108 (90 BASE) MCG/ACT IN AERS
4.0000 | INHALATION_SPRAY | Freq: Once | RESPIRATORY_TRACT | Status: AC
  Administered 2019-05-07: 4 via RESPIRATORY_TRACT
  Filled 2019-05-07: qty 6.7

## 2019-05-07 MED ORDER — BUDESONIDE-FORMOTEROL FUMARATE 80-4.5 MCG/ACT IN AERO: 2.0000 | INHALATION_SPRAY | Freq: Every morning | RESPIRATORY_TRACT | 12 refills | Status: DC

## 2019-05-07 MED ORDER — IPRATROPIUM BROMIDE HFA 17 MCG/ACT IN AERS
2.0000 | INHALATION_SPRAY | Freq: Once | RESPIRATORY_TRACT | Status: AC
  Administered 2019-05-07: 2 via RESPIRATORY_TRACT
  Filled 2019-05-07: qty 12.9

## 2019-05-07 MED ORDER — METHYLPREDNISOLONE SODIUM SUCC 125 MG IJ SOLR
125.0000 mg | Freq: Once | INTRAMUSCULAR | Status: AC
  Administered 2019-05-07: 125 mg via INTRAVENOUS
  Filled 2019-05-07: qty 2

## 2019-05-07 MED ORDER — IOHEXOL 350 MG/ML SOLN
75.0000 mL | Freq: Once | INTRAVENOUS | Status: AC | PRN
  Administered 2019-05-07: 75 mL via INTRAVENOUS

## 2019-05-07 MED ORDER — AEROCHAMBER PLUS FLO-VU LARGE MISC
1.0000 | Freq: Once | Status: AC
  Administered 2019-05-07: 16:00:00 1

## 2019-05-07 MED ORDER — AZITHROMYCIN 250 MG PO TABS: 500.0000 mg | ORAL_TABLET | Freq: Every day | ORAL | 0 refills | Status: AC

## 2019-05-07 NOTE — Patient Instructions (Signed)
Due to SOB with minimal activity, night sweats and fatigue; Audrey Russell was advised to go to [facility] for F2F eval. Need to rule out pneumonia vs COVID vs COPD exacerbation? She verbalized understanding and agreed to go to urgent care clinic.

## 2019-05-07 NOTE — Discharge Instructions (Addendum)
You were seen today for shortness of breath.  This is most likely due to a COPD exacerbation.  Follow-up with your PCP/pulmonologist as soon as possible.  Come back to the emergency room for worsening shortness of breath, chest pain, dizziness, inability to breathe. Use your Symbicort twice daily, unless your PCP says otherwise. Make sure you wash mouth after using this.Take Azithromycin as directed, come back to the ER for any allergic reactions to this. Use Albuterol as needed, take Atrovent 2 times daily, unless PCP says otherwise.

## 2019-05-07 NOTE — ED Notes (Signed)
Ambulated pt in room, pt SpO2 stayed within normal limits. 92%-95%
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
## 2019-05-07 NOTE — ED Notes (Signed)
Patient verbalizes understanding of discharge instructions. Opportunity for questioning and answers were provided. Armband removed by staff, pt discharged from ED ambulatory.   

## 2019-05-07 NOTE — Progress Notes (Signed)
Virtual Visit via Video Note  I connected with@ on 05/07/19 at  9:30 AM EDT by a video enabled telemedicine application and verified that I am speaking with the correct person using two identifiers.  Location: Patient:Home Provider: Office Participants: patient and provider  I discussed the limitations of evaluation and management by telemedicine and the availability of in person appointments. I also discussed with the patient that there may be a patient responsible charge related to this service. The patient expressed understanding and agreed to proceed.  QJ:FHLKT and SOB, claims after 2nd covid shot 05/01/19 Pfizer shes had night sweats and coughing that brings on SOB//pt reports blood in mucus//no fever//pt tried OTC cough syrup didn't help//no means of vitals  History of Present Illness: Cough This is a new problem. The current episode started in the past 7 days. The problem has been rapidly worsening. The problem occurs constantly. The cough is non-productive. Associated symptoms include chills, hemoptysis, myalgias, shortness of breath, sweats and wheezing. Pertinent negatives include no chest pain, ear congestion, ear pain, fever, headaches, heartburn, nasal congestion, postnasal drip, rash, rhinorrhea or sore throat. The symptoms are aggravated by lying down (any activity). She has tried OTC cough suppressant and a beta-agonist inhaler for the symptoms. The treatment provided no relief. Her past medical history is significant for bronchitis and COPD.  hx of lung cancer with right lobe resection.  Observations/Objective: Physical Exam  Constitutional: She is oriented to person, place, and time. No distress.  Unable to provide any vital signs.  Pulmonary/Chest: She has rales.  coarse cough between each sentence. No neck or upper chest wall muscle retraction.  Neurological: She is alert and oriented to person, place, and time.   Assessment and Plan: Jennings was seen today for  cough.  Diagnoses and all orders for this visit:  SOB (shortness of breath)  Cough due to bronchospasm  Night sweat   Follow Up Instructions: Due to SOB with minimal activity, night sweats and fatigue; Ms. Moudy was advised to go to [facility] for F2F eval. Need to rule out pneumonia vs COVID vs COPD exacerbation? She verbalized understanding and agreed to go to urgent care clinic.   I discussed the assessment and treatment plan with the patient. The patient was provided an opportunity to ask questions and all were answered. The patient agreed with the plan and demonstrated an understanding of the instructions.   The patient was advised to call back or seek an in-person evaluation if the symptoms worsen or if the condition fails to improve as anticipated.   Wilfred Lacy, NP

## 2019-05-07 NOTE — ED Provider Notes (Signed)
  Face-to-face evaluation   History: SOB for 4 days.  She typically only uses albuterol inhaler.  She had Covid immunization, about 6 days ago.  She denies fever, chest pain, weakness or dizziness.  Physical exam: Alert, calm and cooperative.  No increased work of breathing.  Lungs with mildly decreased air movement, bilaterally.  Medical screening examination/treatment/procedure(s) were conducted as a shared visit with non-physician practitioner(s) and myself.  I personally evaluated the patient during the encounter    Daleen Bo, MD 05/08/19 [phone]

## 2019-05-07 NOTE — ED Triage Notes (Addendum)
Patient brought down from UC for shortness of breath and cough x 2 weeks. Reports [facility] is concerned for pneumonia and UC concerned about COPD. Quit smoking 2 years ago but has never been officially diagnosed with COPD. Reports chest tightness when coughing spells occur. Has been using albuterol inhaler throughout the day w/o relief.

## 2019-05-07 NOTE — ED Notes (Signed)
Pt placed on 2 lpm O2 via [place] for comfort per PA

## 2019-05-13 ENCOUNTER — Telehealth: Payer: Self-pay | Admitting: Nurse Practitioner

## 2019-05-13 NOTE — Progress Notes (Signed)
  Chronic Care Management   Outreach Note  05/13/2019 Name: Audrey Russell MRN: [id number] DOB: 01/15/1956  Referred by: Flossie Buffy, NP Reason for referral : No chief complaint on file.   An unsuccessful telephone outreach was attempted today. The patient was referred to the pharmacist for assistance with care management and care coordination.   Follow Up Plan:   Earney [place] Upstream Scheduler

## 2019-05-20 ENCOUNTER — Telehealth: Payer: Self-pay | Admitting: Nurse Practitioner

## 2019-05-20 NOTE — Progress Notes (Signed)
  Chronic Care Management   Outreach Note  05/20/2019 Name: Audrey Russell MRN: [id number] DOB: [birth year]  Referred by: Flossie Buffy, NP Reason for referral : No chief complaint on file.   An unsuccessful telephone outreach was attempted today. The patient was referred to the pharmacist for assistance with care management and care coordination.   This note is not being shared with the patient for the following reason: To respect privacy (The patient or proxy has requested that the information not be shared).  Follow Up Plan:   Earney [place] Upstream Scheduler

## 2019-05-21 ENCOUNTER — Other Ambulatory Visit: Payer: Self-pay | Admitting: Internal Medicine

## 2019-05-24 ENCOUNTER — Telehealth: Payer: Self-pay | Admitting: Physician Assistant

## 2019-05-24 ENCOUNTER — Ambulatory Visit (HOSPITAL_COMMUNITY): Admission: RE | Admit: 2019-05-24 | Payer: PPO | Source: Ambulatory Visit

## 2019-05-24 ENCOUNTER — Inpatient Hospital Stay: Payer: PPO

## 2019-05-24 NOTE — Telephone Encounter (Signed)
Spoke to the patient who was scheduled to have a CT scan of the chest today. She had some car trouble and was unable to make it to her appointment. Discussed that she had a CTA on 4/16. She did not need to reschedule her chest CT. Discussed that she should keep her appointment with Dr. Julien Nordmann on 5/5 as planned.

## 2019-05-25 DIAGNOSIS — H7201 Central perforation of tympanic membrane, right ear: Secondary | ICD-10-CM | POA: Diagnosis not present

## 2019-05-25 DIAGNOSIS — H6981 Other specified disorders of Eustachian tube, right ear: Secondary | ICD-10-CM | POA: Diagnosis not present

## 2019-05-26 ENCOUNTER — Inpatient Hospital Stay: Payer: PPO | Admitting: Internal Medicine

## 2019-06-01 ENCOUNTER — Inpatient Hospital Stay: Payer: PPO

## 2019-06-01 ENCOUNTER — Inpatient Hospital Stay: Payer: PPO | Admitting: Internal Medicine

## 2019-06-16 ENCOUNTER — Other Ambulatory Visit: Payer: Self-pay | Admitting: Nurse Practitioner

## 2019-06-16 DIAGNOSIS — H6993 Unspecified Eustachian tube disorder, bilateral: Secondary | ICD-10-CM

## 2019-06-27 ENCOUNTER — Other Ambulatory Visit: Payer: Self-pay | Admitting: Nurse Practitioner

## 2019-06-28 ENCOUNTER — Telehealth: Payer: Self-pay | Admitting: Internal Medicine

## 2019-06-28 NOTE — Telephone Encounter (Signed)
R/s appt from 6/8 to 6/21. Pt unable to come in on 6/8. Requested afternoons.

## 2019-06-29 ENCOUNTER — Ambulatory Visit: Payer: PPO | Admitting: Internal Medicine

## 2019-06-29 ENCOUNTER — Other Ambulatory Visit: Payer: PPO

## 2019-07-12 ENCOUNTER — Inpatient Hospital Stay: Payer: PPO | Admitting: Internal Medicine

## 2019-07-12 ENCOUNTER — Inpatient Hospital Stay: Payer: PPO

## 2019-07-20 IMAGING — MR MR ABDOMEN WO/W CM
12 of 17 series · 27 of 48 positions shown · IV contrast (17ml Multihance)
Comparison: 08/01/2017 and the PET of 07/16/2017.

CLINICAL DATA: Follow-up of liver lesion. Status post wedge
resection on the right on 08/15/2017. Stage I adenocarcinoma.

EXAM:
MRI ABDOMEN WITHOUT AND WITH CONTRAST
TECHNIQUE: Multiplanar multisequence MR imaging of the abdomen was performed
both before and after the administration of intravenous contrast.
CONTRAST:  17mL MULTIHANCE GADOBENATE DIMEGLUMINE 529 MG/ML IV SOLN

[Series 3: T2 · coronal · 5.0mm · 1.56mm/px · 1 of 38 slices shown (1 of 3)]
[im 1/38]
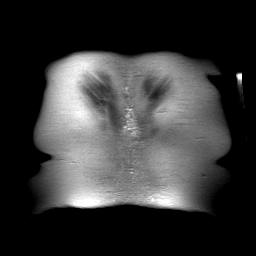

[Series 4: axial tru fisp · axial · 5.0mm · 1.48mm/px · 1 of 47 slices shown]
[im 1/47]
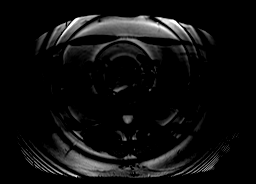

[Series 5: T2 · axial · 6.5mm · 0.74mm/px · 1 of 36 slices shown (2 of 3)]
[im 1/36]
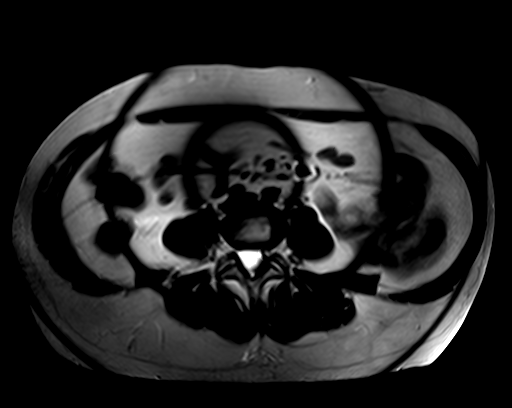

[Series 6: ep2d_[id number]_800_p2 · axial · 6.0mm · 1.98mm/px · z∈[-100,+158]mm · 2 of 102 slices shown]
[im 1/102]
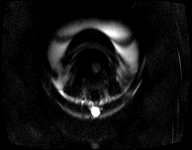
[im 102/102]
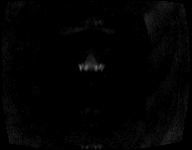

[Series 7: ep2d_[id number]_[id number]_adc · axial · 6.0mm · 1.98mm/px · 1 of 34 slices shown]
[im 1/34]
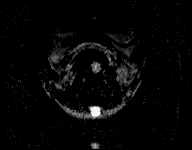

[Series 8: T2 · axial · 5.0mm · 1.48mm/px · 1 of 43 slices shown (3 of 3)]
[im 1/43]
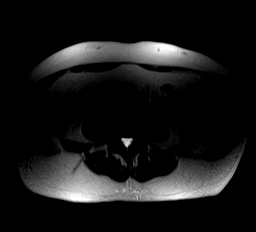

[Series 9: axial in out · axial · 6.0mm · 0.74mm/px · z∈[-100,+142]mm · 2 of 72 slices shown]
[im 1/72]
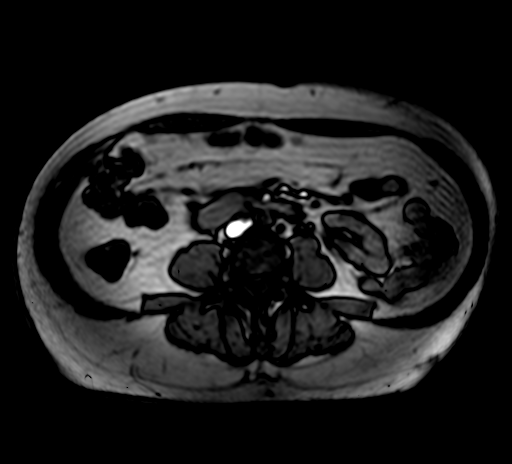
[im 72/72]
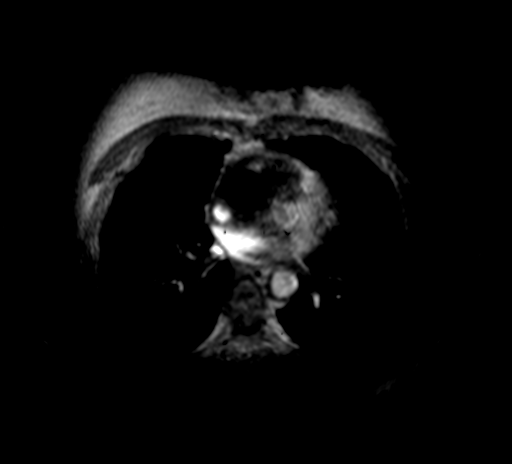

[Series 10: T1 dynamic · axial · non-contrast · 2.2mm · 0.78mm/px · z∈[-101,+143]mm · 4 of 112 slices shown]
[im 1/112]
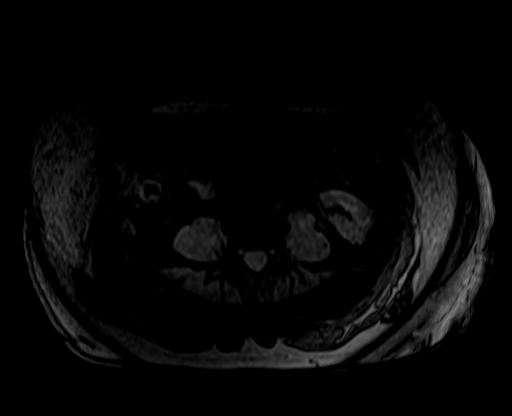
[im 38/112]
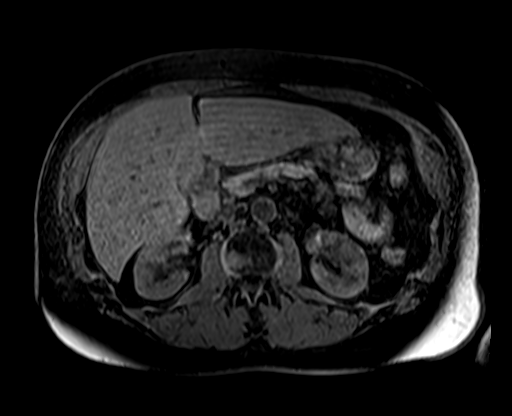
[im 75/112]
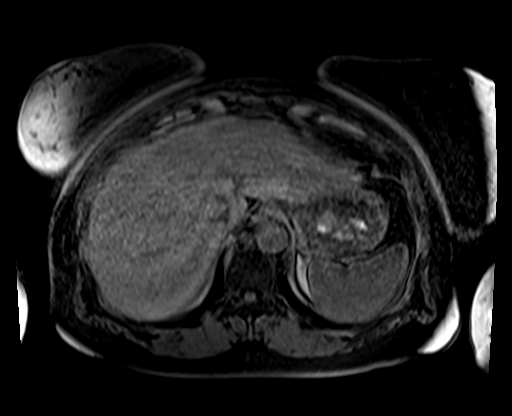
[im 112/112]
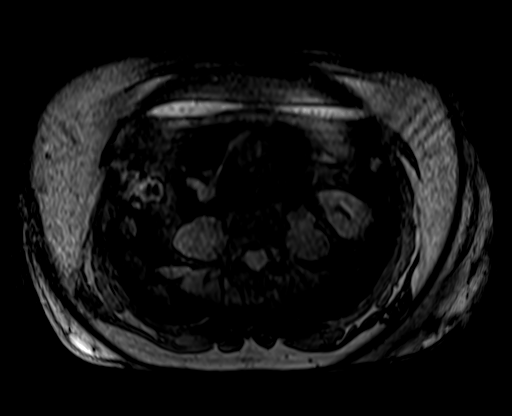

[Series 11: post 25 sec · axial · 2.2mm · 0.78mm/px · z∈[-101,+143]mm · 4 of 112 slices shown]
[im 1/112]
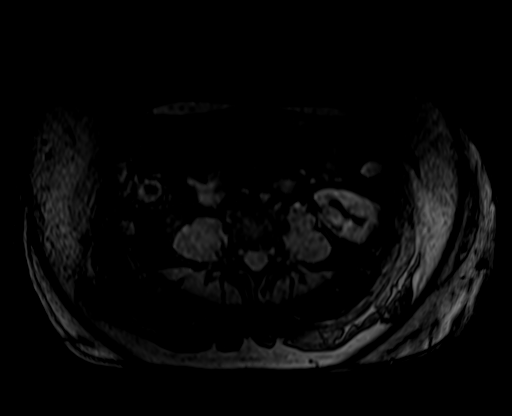
[im 38/112]
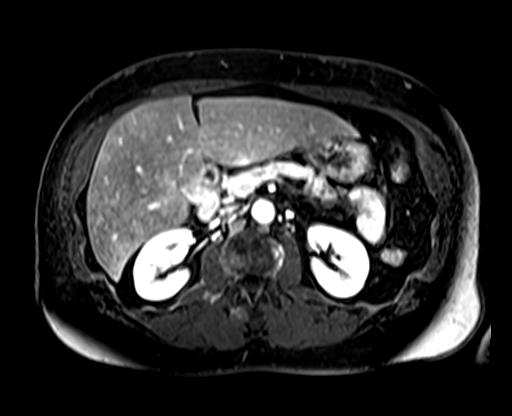
[im 75/112]
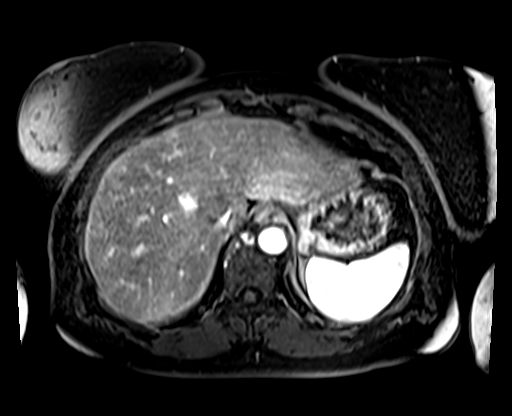
[im 112/112]
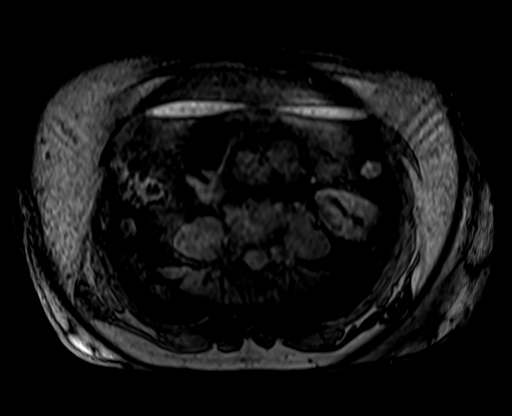

[Series 12: post 25 sec_sub · axial · 2.2mm · 0.78mm/px · z∈[-101,+143]mm · 4 of 112 slices shown]
[im 1/112]
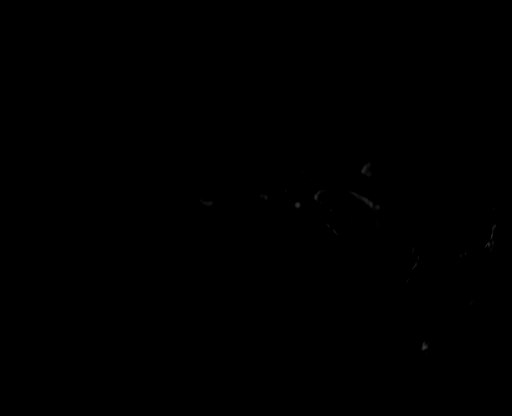
[im 38/112]
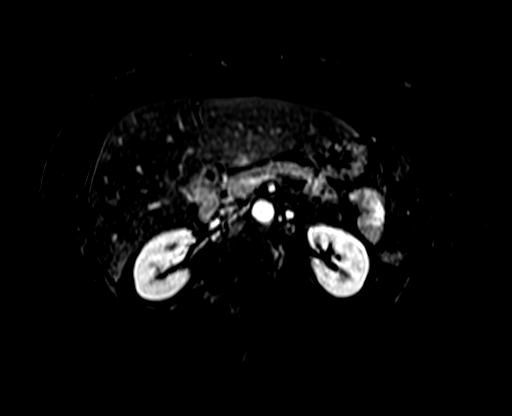
[im 75/112]
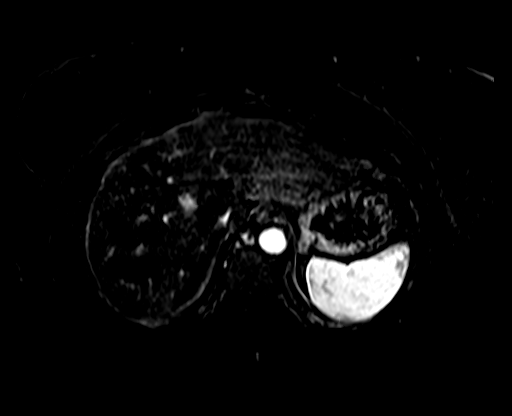
[im 112/112]
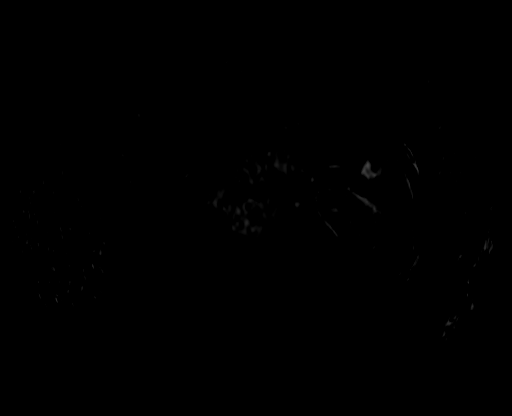

[Series 13: post 45 sec · axial · 2.2mm · 0.78mm/px · z∈[-101,+143]mm · 4 of 112 slices shown]
[im 1/112]
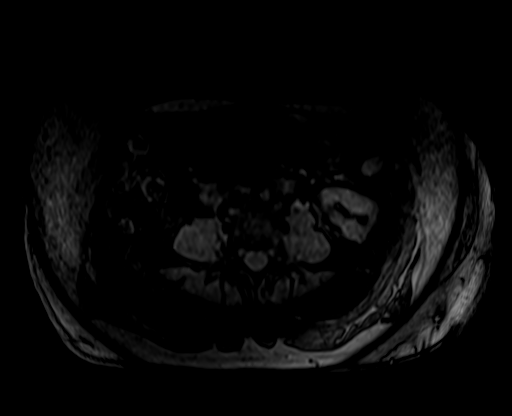
[im 38/112]
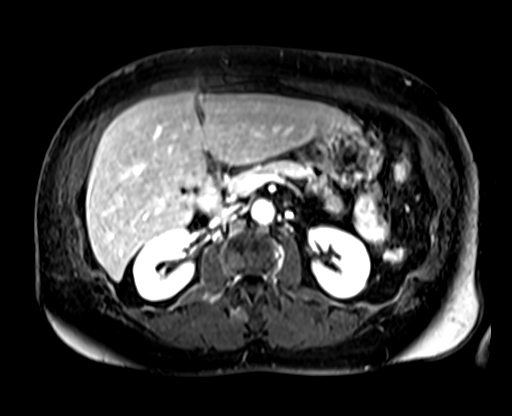
[im 75/112]
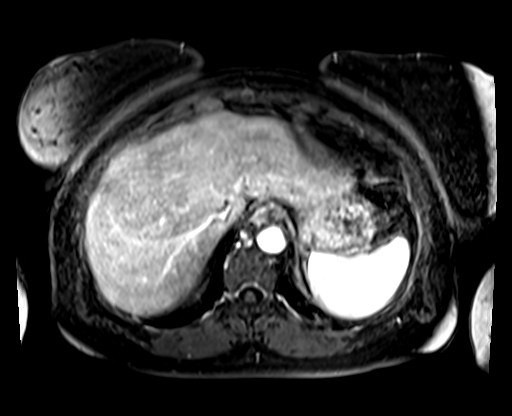
[im 112/112]
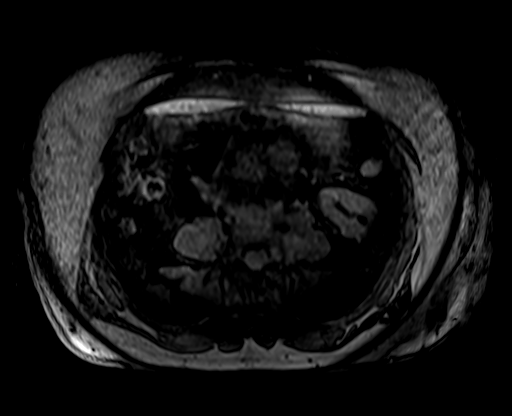

[Series 14: post 45 sec_sub · axial · 2.2mm · 0.78mm/px · z∈[-101,-20]mm · 2 of 112 slices shown]
[im 1/112]
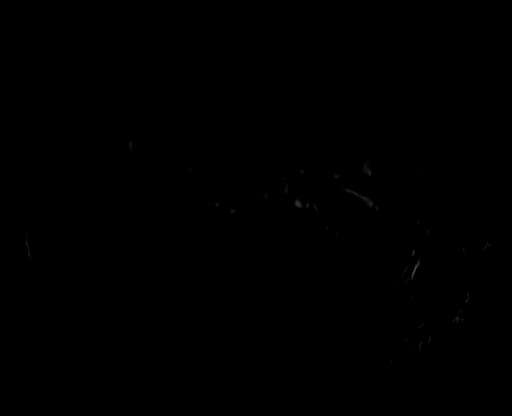
[im 38/112]
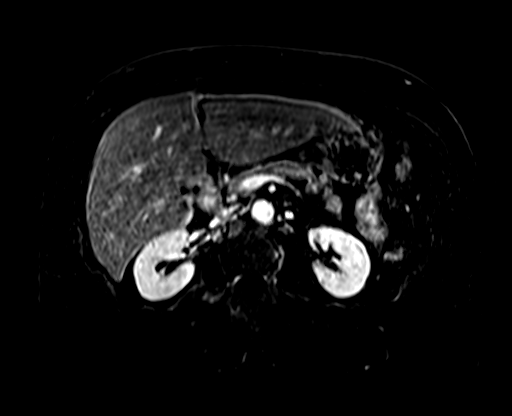

[27 of 48 positions shown; findings below may reference images not displayed]

FINDINGS: Lower chest: Normal heart size without pericardial or pleural
effusion.

Hepatobiliary: No cirrhosis.  Moderate hepatic steatosis.

Again identified is a high right hepatic lobe arterially
hyperenhancing lesion which measures maximally 1.7 cm on image
41/13. Similar to 2.0 cm on the prior exam. Arterial phase hyper
enhancement on image 40/11 with a hyperenhancing central scar,
including image 42/16.

No new liver lesions. Cholecystectomy, without biliary ductal
dilatation.

Pancreas:  Normal, without mass or ductal dilatation.

Spleen:  Normal in size, without focal abnormality.

Adrenals/Urinary Tract: Normal adrenal glands. Small left greater
than right renal cysts. Right mild UPJ obstruction pattern again
identified. T2 hypointensity in the right renal pelvis on image [DATE]
is favored to be artifactual, and is not confirmed on other pulse
sequences.

Stomach/Bowel: Normal stomach and abdominal bowel loops.

Vascular/Lymphatic: Aortic atherosclerosis. No retroperitoneal or
retrocrural adenopathy.

Other:  No ascites.

Musculoskeletal: No acute osseous abnormality.
IMPRESSION: 1. Similar size of a hyperenhancing high right hepatic lobe lesion.
Especially given the delayed central area of hyperenhancement
(central scar) FNH is favored. This could be confirmed with Eovist
enhanced MRI at 6 months.
2. Otherwise, no acute process or evidence of metastatic disease.
3.  Aortic Atherosclerosis ([id number]).
4. Right ureteropelvic junction obstruction, mild.

## 2019-08-02 ENCOUNTER — Telehealth: Payer: Self-pay | Admitting: Medical Oncology

## 2019-08-02 ENCOUNTER — Inpatient Hospital Stay: Payer: PPO

## 2019-08-02 ENCOUNTER — Inpatient Hospital Stay: Payer: PPO | Admitting: Internal Medicine

## 2019-08-02 NOTE — Telephone Encounter (Signed)
"  Stomach virus with diarrhea". She will call to r/s .

## 2019-08-17 ENCOUNTER — Other Ambulatory Visit: Payer: Self-pay

## 2019-08-17 ENCOUNTER — Encounter: Payer: Self-pay | Admitting: Nurse Practitioner

## 2019-08-17 ENCOUNTER — Ambulatory Visit (INDEPENDENT_AMBULATORY_CARE_PROVIDER_SITE_OTHER): Payer: PPO | Admitting: Nurse Practitioner

## 2019-08-17 VITALS — BP 146/80 | HR 82 | Temp 96.9°F | Ht 69.0 in | Wt 168.8 lb

## 2019-08-17 DIAGNOSIS — E781 Pure hyperglyceridemia: Secondary | ICD-10-CM

## 2019-08-17 DIAGNOSIS — F325 Major depressive disorder, single episode, in full remission: Secondary | ICD-10-CM

## 2019-08-17 DIAGNOSIS — E1165 Type 2 diabetes mellitus with hyperglycemia: Secondary | ICD-10-CM

## 2019-08-17 LAB — HEMOGLOBIN A1C: Hgb A1c MFr Bld: 8.4 % — ABNORMAL HIGH (ref 4.6–6.5)

## 2019-08-17 LAB — LIPID PANEL
Cholesterol: 123 mg/dL (ref 0–200)
HDL: 27.8 mg/dL — ABNORMAL LOW (ref >39.00)
LDL Cholesterol: 57 mg/dL (ref 0–99)
NonHDL: 95.16
Total CHOL/HDL Ratio: 4
Triglycerides: 192 mg/dL — ABNORMAL HIGH (ref 0.0–149.0)
VLDL: 38.4 mg/dL (ref 0.0–40.0)

## 2019-08-17 MED ORDER — MIRTAZAPINE 30 MG PO TABS: 30.0000 mg | ORAL_TABLET | Freq: Every day | ORAL | 1 refills | Status: DC

## 2019-08-17 MED ORDER — FENOFIBRATE 160 MG PO TABS: 160.0000 mg | ORAL_TABLET | Freq: Every day | ORAL | 3 refills | Status: DC

## 2019-08-17 MED ORDER — METFORMIN HCL 850 MG PO TABS: 850.0000 mg | ORAL_TABLET | Freq: Two times a day (BID) | ORAL | 1 refills | Status: DC

## 2019-08-17 MED ORDER — CITALOPRAM HYDROBROMIDE 40 MG PO TABS: 40.0000 mg | ORAL_TABLET | Freq: Every day | ORAL | 3 refills | Status: DC

## 2019-08-17 MED ORDER — GLIPIZIDE ER 5 MG PO TB24: 5.0000 mg | ORAL_TABLET | Freq: Every day | ORAL | 1 refills | Status: DC

## 2019-08-17 NOTE — Assessment & Plan Note (Signed)
>>  ASSESSMENT AND PLAN FOR DEPRESSION, MAJOR, SINGLE EPISODE, COMPLETE REMISSION (HCC) WRITTEN ON 08/17/2019  7:51 PM BY Sadiq Mccauley LUM, NP  Stable mood with citalopram and remeron Denies any adverse side effects.  Refills sent

## 2019-08-17 NOTE — Assessment & Plan Note (Signed)
Improving triglyceride with crestor and zetia Lipid Panel     Component Value Date/Time   CHOL 123 08/17/2019 1325   TRIG 192.0 (H) 08/17/2019 1325   HDL 27.80 (L) 08/17/2019 1325   CHOLHDL 4 08/17/2019 1325   VLDL 38.4 08/17/2019 1325   LDLCALC 57 08/17/2019 1325   LDLCALC 76 08/11/2018 1416   LDLDIRECT 67.0 02/16/2019 1349   continue current medications

## 2019-08-17 NOTE — Assessment & Plan Note (Signed)
Stable mood with citalopram and remeron Denies any adverse side effects.  Refills sent

## 2019-08-17 NOTE — Assessment & Plan Note (Addendum)
Controlled with glipizide and metformin  Repeat hgbA1c today Advised to schedule annual eye exam

## 2019-08-17 NOTE — Progress Notes (Signed)
Subjective:  Patient ID: Audrey Russell, female    DOB: February 04, 1955  Age: 64 y.o. MRN: [id number]  CC: Diabetes (Pt here for 6 month f/u)  HPI  DM (diabetes mellitus) ([place]) Controlled with glipizide and metformin  Repeat hgbA1c today Advised to schedule annual eye exam  Hypertriglyceridemia Improving triglyceride with crestor and zetia Lipid Panel     Component Value Date/Time   CHOL 123 08/17/2019 1325   TRIG 192.0 (H) 08/17/2019 1325   HDL 27.80 (L) 08/17/2019 1325   CHOLHDL 4 08/17/2019 1325   VLDL 38.4 08/17/2019 1325   LDLCALC 57 08/17/2019 1325   LDLCALC 76 08/11/2018 1416   LDLDIRECT 67.0 02/16/2019 1349   continue current medications  Depression, major, single episode, complete remission (HCC) Stable mood with citalopram and remeron Denies any adverse side effects.  Refills sent  Wt Readings from Last 3 Encounters:  08/17/19 168 lb 12.8 oz (76.6 kg)  05/07/19 164 lb (74.4 kg)  05/07/19 164 lb (74.4 kg)   Reviewed past Medical, Social and Family history today.  Outpatient Medications Prior to Visit  Medication Sig Dispense Refill  . acetaminophen (TYLENOL) 500 MG tablet Take 1,000 mg by mouth every 6 (six) hours as needed for mild pain or moderate pain.    Marland Kitchen aspirin EC 81 MG tablet Take 1 tablet (81 mg total) by mouth daily. 90 tablet 3  . budesonide-formoterol (SYMBICORT) 80-4.5 MCG/ACT inhaler Inhale 2 puffs into the lungs every morning. 1 Inhaler 12  . ezetimibe (ZETIA) 10 MG tablet Take 1 tablet (10 mg total) by mouth every evening. 90 tablet 3  . famotidine (PEPCID) 20 MG tablet TAKE 1 TABLET BY MOUTH EVERY DAY 90 tablet 0  . fluticasone (FLONASE) 50 MCG/ACT nasal spray Place 2 sprays into both nostrils daily. 16 g 3  . PROAIR HFA 108 (90 Base) MCG/ACT inhaler TAKE 2 PUFFS BY MOUTH EVERY 6 HOURS AS NEEDED FOR WHEEZE OR SHORTNESS OF BREATH (Patient taking differently: Inhale 2 puffs into the lungs every 6 (six) hours as needed for wheezing or  shortness of breath. ) 8.5 g 5  . rosuvastatin (CRESTOR) 10 MG tablet TAKE 1 TABLET BY MOUTH EVERY DAY 90 tablet 3  . sodium chloride (OCEAN) 0.65 % SOLN nasal spray Place 1 spray into both nostrils as needed for congestion. 15 mL 0  . citalopram (CELEXA) 40 MG tablet Take 1 tablet (40 mg total) by mouth daily. 90 tablet 3  . fenofibrate 160 MG tablet Take 1 tablet (160 mg total) by mouth daily. 90 tablet 3  . glipiZIDE (GLUCOTROL XL) 5 MG 24 hr tablet Take 1 tablet (5 mg total) by mouth daily after supper. 90 tablet 1  . metFORMIN (GLUCOPHAGE) 850 MG tablet Take 1 tablet (850 mg total) by mouth 2 (two) times daily with a meal. 60 tablet 5  . mirtazapine (REMERON) 30 MG tablet Take 1 tablet (30 mg total) by mouth at bedtime. 90 tablet 1  . cetirizine (ZYRTEC) 10 MG tablet TAKE 1 TABLET BY MOUTH EVERYDAY AT BEDTIME (Patient not taking: Reported on 05/07/2019) 30 tablet 1  . diclofenac sodium (VOLTAREN) 1 % GEL Apply 2 g topically 3 (three) times daily as needed. (Patient not taking: Reported on 08/17/2019) 100 g 1  . miconazole (MICOTIN) 2 % powder Apply topically 2 (two) times a day. (Patient not taking: Reported on 05/07/2019) 70 g 3   No facility-administered medications prior to visit.    ROS See HPI  Objective:  BP Marland Kitchen)  146/80 (BP Location: Left Arm, Patient Position: Sitting, Cuff Size: Normal)   Pulse 82   Temp (!) 96.9 F (36.1 C) (Temporal)   Ht 5\' 9"  (1.753 m)   Wt 168 lb 12.8 oz (76.6 kg)   SpO2 (!) 85%   BMI 24.93 kg/m   Physical Exam Cardiovascular:     Pulses: Normal pulses.     Heart sounds: Normal heart sounds.  Pulmonary:     Effort: Pulmonary effort is normal. No respiratory distress.     Breath sounds: Normal breath sounds.  Musculoskeletal:     Right lower leg: No edema.     Left lower leg: No edema.  Neurological:     Mental Status: She is alert and oriented to person, place, and time.    Assessment & Plan:  This visit occurred during the SARS-CoV-2 public  health emergency.  Safety protocols were in place, including screening questions prior to the visit, additional usage of staff PPE, and extensive cleaning of exam room while observing appropriate contact time as indicated for disinfecting solutions.   Audrey Russell was seen today for diabetes.  Diagnoses and all orders for this visit:  Type 2 diabetes mellitus with hyperglycemia, without long-term current use of insulin (HCC) -     Hemoglobin A1c -     glipiZIDE (GLUCOTROL XL) 5 MG 24 hr tablet; Take 1 tablet (5 mg total) by mouth daily after supper. -     metFORMIN (GLUCOPHAGE) 850 MG tablet; Take 1 tablet (850 mg total) by mouth 2 (two) times daily with a meal.  Hypertriglyceridemia -     fenofibrate 160 MG tablet; Take 1 tablet (160 mg total) by mouth daily. -     Lipid panel  Depression, major, single episode, complete remission (HCC) -     mirtazapine (REMERON) 30 MG tablet; Take 1 tablet (30 mg total) by mouth at bedtime. -     citalopram (CELEXA) 40 MG tablet; Take 1 tablet (40 mg total) by mouth daily.    Problem List Items Addressed This Visit      Endocrine   DM (diabetes mellitus) ([place]) - Primary    Controlled with glipizide and metformin  Repeat hgbA1c today Advised to schedule annual eye exam      Relevant Medications   glipiZIDE (GLUCOTROL XL) 5 MG 24 hr tablet   metFORMIN (GLUCOPHAGE) 850 MG tablet   Other Relevant Orders   Hemoglobin A1c (Completed)     Other   Depression, major, single episode, complete remission (HCC)    Stable mood with citalopram and remeron Denies any adverse side effects.  Refills sent      Relevant Medications   mirtazapine (REMERON) 30 MG tablet   citalopram (CELEXA) 40 MG tablet   Hypertriglyceridemia    Improving triglyceride with crestor and zetia Lipid Panel     Component Value Date/Time   CHOL 123 08/17/2019 1325   TRIG 192.0 (H) 08/17/2019 1325   HDL 27.80 (L) 08/17/2019 1325   CHOLHDL 4 08/17/2019 1325   VLDL 38.4  08/17/2019 1325   LDLCALC 57 08/17/2019 1325   LDLCALC 76 08/11/2018 1416   LDLDIRECT 67.0 02/16/2019 1349   continue current medications      Relevant Medications   fenofibrate 160 MG tablet   Other Relevant Orders   Lipid panel (Completed)      Follow-up: Return in about 6 months (around 02/17/2020) for DM and HTN, hyperlipidemia (fasting).  Wilfred Lacy, NP

## 2019-08-17 NOTE — Patient Instructions (Addendum)
Schedule appt with pulmonology and oncology.  mproving lipid panel Some increase in HgbA1c 7.7 to 8.4 Continue current medications F/up in 20months

## 2019-09-15 ENCOUNTER — Telehealth: Payer: Self-pay | Admitting: Nurse Practitioner

## 2019-09-15 DIAGNOSIS — E119 Type 2 diabetes mellitus without complications: Secondary | ICD-10-CM | POA: Diagnosis not present

## 2019-09-15 DIAGNOSIS — H31091 Other chorioretinal scars, right eye: Secondary | ICD-10-CM | POA: Diagnosis not present

## 2019-09-15 DIAGNOSIS — H02421 Myogenic ptosis of right eyelid: Secondary | ICD-10-CM | POA: Diagnosis not present

## 2019-09-15 DIAGNOSIS — H2513 Age-related nuclear cataract, bilateral: Secondary | ICD-10-CM | POA: Diagnosis not present

## 2019-09-15 LAB — HM DIABETES EYE EXAM

## 2019-09-15 NOTE — Telephone Encounter (Signed)
Patient dropped off disability parking paperwork for [facility] to sign. Paperwork is in her folder in front office.

## 2019-09-16 DIAGNOSIS — Z0279 Encounter for issue of other medical certificate: Secondary | ICD-10-CM

## 2019-09-16 NOTE — Telephone Encounter (Signed)
Forms have been completed, a copy has been kept for our records, and the completed forms are up front ready for patient to pick up. Patient has been notified.

## 2019-09-20 ENCOUNTER — Encounter: Payer: Self-pay | Admitting: Nurse Practitioner

## 2019-10-03 ENCOUNTER — Other Ambulatory Visit: Payer: Self-pay | Admitting: Nurse Practitioner

## 2019-10-04 ENCOUNTER — Other Ambulatory Visit: Payer: Self-pay | Admitting: Internal Medicine

## 2019-10-05 NOTE — Telephone Encounter (Signed)
This is a [place] pt.  °

## 2019-10-22 ENCOUNTER — Other Ambulatory Visit: Payer: Self-pay | Admitting: Internal Medicine

## 2019-12-29 ENCOUNTER — Telehealth: Payer: Self-pay | Admitting: Nurse Practitioner

## 2019-12-29 NOTE — Progress Notes (Signed)
°  Chronic Care Management   Outreach Note  12/29/2019 Name: Deaisa Merida MRN: [id number] DOB: [birth year]  Referred by: Flossie Buffy, NP Reason for referral : No chief complaint on file.   An unsuccessful telephone outreach was attempted today. The patient was referred to the pharmacist for assistance with care management and care coordination.   Follow Up Plan:   Carley Perdue [employer] Scheduler

## 2020-02-15 ENCOUNTER — Other Ambulatory Visit: Payer: Self-pay | Admitting: Nurse Practitioner

## 2020-02-15 DIAGNOSIS — E1165 Type 2 diabetes mellitus with hyperglycemia: Secondary | ICD-10-CM

## 2020-02-17 ENCOUNTER — Telehealth: Payer: Self-pay | Admitting: Internal Medicine

## 2020-02-17 ENCOUNTER — Other Ambulatory Visit: Payer: Self-pay

## 2020-02-17 NOTE — Telephone Encounter (Signed)
Returned call to schedule an appointment. Sent request to provider for approval.

## 2020-02-18 ENCOUNTER — Ambulatory Visit (INDEPENDENT_AMBULATORY_CARE_PROVIDER_SITE_OTHER): Payer: PPO

## 2020-02-18 ENCOUNTER — Encounter: Payer: Self-pay | Admitting: Nurse Practitioner

## 2020-02-18 ENCOUNTER — Ambulatory Visit (INDEPENDENT_AMBULATORY_CARE_PROVIDER_SITE_OTHER): Payer: PPO | Admitting: Nurse Practitioner

## 2020-02-18 VITALS — BP 138/70 | HR 86 | Temp 97.3°F | Ht 69.0 in | Wt 171.2 lb

## 2020-02-18 DIAGNOSIS — E1165 Type 2 diabetes mellitus with hyperglycemia: Secondary | ICD-10-CM

## 2020-02-18 DIAGNOSIS — R0600 Dyspnea, unspecified: Secondary | ICD-10-CM

## 2020-02-18 DIAGNOSIS — F325 Major depressive disorder, single episode, in full remission: Secondary | ICD-10-CM | POA: Diagnosis not present

## 2020-02-18 DIAGNOSIS — E781 Pure hyperglyceridemia: Secondary | ICD-10-CM | POA: Diagnosis not present

## 2020-02-18 DIAGNOSIS — R7989 Other specified abnormal findings of blood chemistry: Secondary | ICD-10-CM

## 2020-02-18 DIAGNOSIS — I10 Essential (primary) hypertension: Secondary | ICD-10-CM

## 2020-02-18 DIAGNOSIS — I700 Atherosclerosis of aorta: Secondary | ICD-10-CM | POA: Diagnosis not present

## 2020-02-18 DIAGNOSIS — R0602 Shortness of breath: Secondary | ICD-10-CM | POA: Diagnosis not present

## 2020-02-18 DIAGNOSIS — R071 Chest pain on breathing: Secondary | ICD-10-CM

## 2020-02-18 DIAGNOSIS — R61 Generalized hyperhidrosis: Secondary | ICD-10-CM | POA: Diagnosis not present

## 2020-02-18 DIAGNOSIS — R0609 Other forms of dyspnea: Secondary | ICD-10-CM

## 2020-02-18 NOTE — Assessment & Plan Note (Addendum)
Repeat HgbA1c at 8% and urine microalbumin: normal Continue metformin and glipizide BP at goal LDL not at goal with crestor and fenofibrate. Managed by cardiology: Dr. Harrington Challenger. Up to date with eye exam: will request report Positive neuropathy, no ulcer or LE eedma F/up in 63months

## 2020-02-18 NOTE — Assessment & Plan Note (Signed)
BP at goal without medication BP Readings from Last 3 Encounters:  02/18/20 138/70  08/17/19 (!) 146/80  05/07/19 (!) 149/88

## 2020-02-18 NOTE — Patient Instructions (Addendum)
Normal CXR Positive Ddimer. Entered order for CT chest. Controlled DM with hgbA1c at 8% and normal urine microalbumin. Normal renal and liver function. Persistent abnormal lipid panel.  Continue medications and f/up with cardiology.

## 2020-02-18 NOTE — Assessment & Plan Note (Signed)
Repeat lipid panel today

## 2020-02-18 NOTE — Progress Notes (Signed)
Subjective:  Patient ID: Audrey Russell, female    DOB: [birth year]  Age: 65 y.o. MRN: [id number]  CC: Follow-up (6 month f/u on DM, HTN, and hyperlipidemia. Pt is fasting. Pt checks blood sugars at home occasionally and the highest has been 130 and the lowest around 120)  Shortness of Breath This is a chronic problem. The current episode started more than 1 month ago. The problem occurs constantly. The problem has been gradually worsening. Associated symptoms include orthopnea and PND. Pertinent negatives include no abdominal pain, chest pain, claudication, coryza, hemoptysis, leg pain, leg swelling, rhinorrhea, sore throat, sputum production, swollen glands, vomiting or wheezing. The symptoms are aggravated by any activity. The patient has no known risk factors for DVT/PE. She has tried beta agonist inhalers and steroid inhalers for the symptoms. The treatment provided no relief. Her past medical history is significant for CAD, chronic lung disease and COPD. There is no history of DVT, PE or a recent surgery.  hx of RLL lung cancer with s/p lobectomy. SOB with minimal activity stared 10/2019 and gradually worsening. Associated with night sweats  Essential hypertension BP at goal without medication BP Readings from Last 3 Encounters:  02/18/20 138/70  08/17/19 (!) 146/80  05/07/19 (!) 149/88    Atherosclerosis of aorta (HCC) Repeat lipid panel today  DM (diabetes mellitus) ([place]) Repeat HgbA1c at 8% and urine microalbumin: normal Continue metformin and glipizide BP at goal LDL not at goal with crestor and fenofibrate. Managed by cardiology: Dr. Harrington Challenger. Up to date with eye exam: will request report Positive neuropathy, no ulcer or LE eedma F/up in 68months  Hypertriglyceridemia Controlled DM with hgbA1c at 8% and normal urine microalbumin. Normal renal and liver function. BP at goal LDL not at goal despite use of zetia, fenofibrate and crestor Continue medications and f/up  with cardiology.    Reviewed past Medical, Social and Family history today.  Outpatient Medications Prior to Visit  Medication Sig Dispense Refill  . acetaminophen (TYLENOL) 500 MG tablet Take 1,000 mg by mouth every 6 (six) hours as needed for mild pain or moderate pain.    Marland Kitchen aspirin EC 81 MG tablet Take 1 tablet (81 mg total) by mouth daily. 90 tablet 3  . budesonide-formoterol (SYMBICORT) 80-4.5 MCG/ACT inhaler Inhale 2 puffs into the lungs every morning. 1 Inhaler 12  . ezetimibe (ZETIA) 10 MG tablet Take 1 tablet (10 mg total) by mouth every evening. 90 tablet 3  . famotidine (PEPCID) 20 MG tablet TAKE 1 TABLET BY MOUTH EVERY DAY 90 tablet 3  . fenofibrate 160 MG tablet Take 1 tablet (160 mg total) by mouth daily. 90 tablet 3  . fluticasone (FLONASE) 50 MCG/ACT nasal spray Place 2 sprays into both nostrils daily. 16 g 3  . glipiZIDE (GLUCOTROL XL) 5 MG 24 hr tablet TAKE 1 TABLET (5 MG TOTAL) BY MOUTH DAILY AFTER SUPPER. 90 tablet 1  . PROAIR HFA 108 (90 Base) MCG/ACT inhaler TAKE 2 PUFFS BY MOUTH EVERY 6 HOURS AS NEEDED FOR WHEEZE OR SHORTNESS OF BREATH (Patient taking differently: Inhale 2 puffs into the lungs every 6 (six) hours as needed for wheezing or shortness of breath.) 8.5 g 5  . rosuvastatin (CRESTOR) 10 MG tablet TAKE 1 TABLET BY MOUTH EVERY DAY 90 tablet 3  . sodium chloride (OCEAN) 0.65 % SOLN nasal spray Place 1 spray into both nostrils as needed for congestion. 15 mL 0  . citalopram (CELEXA) 40 MG tablet Take 1 tablet (40 mg total)  by mouth daily. 90 tablet 3  . metFORMIN (GLUCOPHAGE) 850 MG tablet Take 1 tablet (850 mg total) by mouth 2 (two) times daily with a meal. 180 tablet 1  . mirtazapine (REMERON) 30 MG tablet Take 1 tablet (30 mg total) by mouth at bedtime. 90 tablet 1   No facility-administered medications prior to visit.    ROS See HPI  Objective:  BP 138/70 (BP Location: Left Arm, Patient Position: Sitting, Cuff Size: Large)   Pulse 86   Temp (!) 97.3  F (36.3 C) (Temporal)   Ht 5\' 9"  (1.753 m)   Wt 171 lb 3.2 oz (77.7 kg)   SpO2 95%   BMI 25.28 kg/m   Physical Exam Constitutional:      General: She is not in acute distress. Cardiovascular:     Rate and Rhythm: Normal rate and regular rhythm.     Pulses: Normal pulses.     Heart sounds: Murmur heard.    Pulmonary:     Effort: Pulmonary effort is normal.     Breath sounds: Normal breath sounds.  Musculoskeletal:     Right lower leg: No edema.     Left lower leg: No edema.  Neurological:     Mental Status: She is alert and oriented to person, place, and time.  Psychiatric:        Mood and Affect: Mood normal.        Behavior: Behavior normal.        Thought Content: Thought content normal.    Assessment & Plan:  This visit occurred during the SARS-CoV-2 public health emergency.  Safety protocols were in place, including screening questions prior to the visit, additional usage of staff PPE, and extensive cleaning of exam room while observing appropriate contact time as indicated for disinfecting solutions.   [name] was seen today for follow-up.  Diagnoses and all orders for this visit:  Essential hypertension -     Comprehensive metabolic panel  Type 2 diabetes mellitus with hyperglycemia, without long-term current use of insulin (HCC) -     Hemoglobin A1c -     Comprehensive metabolic panel -     Microalbumin / creatinine urine ratio -     metFORMIN (GLUCOPHAGE) 850 MG tablet; Take 1 tablet (850 mg total) by mouth 2 (two) times daily with a meal.  Hypertriglyceridemia -     Lipid panel  Dyspnea on exertion -     B Nat Peptide -     DG Chest 2 View -     D-Dimer, Quantitative -     Cancel: CT Angio Chest W/Cm &/Or Wo Cm; Future -     CT Angio Chest W/Cm &/Or Wo Cm; Future  Atherosclerosis of aorta (HCC)  Positive D-dimer -     Cancel: CT Angio Chest W/Cm &/Or Wo Cm; Future -     CT Angio Chest W/Cm &/Or Wo Cm; Future  Depression, major, single episode,  complete remission (HCC) -     citalopram (CELEXA) 40 MG tablet; Take 1 tablet (40 mg total) by mouth daily. -     mirtazapine (REMERON) 30 MG tablet; Take 1 tablet (30 mg total) by mouth at bedtime.   Problem List Items Addressed This Visit      Cardiovascular and Mediastinum   Atherosclerosis of aorta (HCC)    Repeat lipid panel today      Essential hypertension - Primary    BP at goal without medication BP Readings from Last 3 Encounters:  02/18/20 138/70  08/17/19 (!) 146/80  05/07/19 (!) 149/88        Relevant Orders   Comprehensive metabolic panel (Completed)     Endocrine   DM (diabetes mellitus) ([place])    Repeat HgbA1c at 8% and urine microalbumin: normal Continue metformin and glipizide BP at goal LDL not at goal with crestor and fenofibrate. Managed by cardiology: Dr. Harrington Challenger. Up to date with eye exam: will request report Positive neuropathy, no ulcer or LE eedma F/up in 76months      Relevant Medications   metFORMIN (GLUCOPHAGE) 850 MG tablet   Other Relevant Orders   Hemoglobin A1c (Completed)   Comprehensive metabolic panel (Completed)   Microalbumin / creatinine urine ratio (Completed)     Other   Depression, major, single episode, complete remission (HCC)   Relevant Medications   citalopram (CELEXA) 40 MG tablet   mirtazapine (REMERON) 30 MG tablet   Hypertriglyceridemia    Controlled DM with hgbA1c at 8% and normal urine microalbumin. Normal renal and liver function. BP at goal LDL not at goal despite use of zetia, fenofibrate and crestor Continue medications and f/up with cardiology.       Relevant Orders   Lipid panel (Completed)    Other Visit Diagnoses    Dyspnea on exertion       Relevant Orders   B Nat Peptide   DG Chest 2 View (Completed)   D-Dimer, Quantitative (Completed)   CT Angio Chest W/Cm &/Or Wo Cm   Positive D-dimer       Relevant Orders   CT Angio Chest W/Cm &/Or Wo Cm      Follow-up: Return in about 6 months (around  08/17/2020) for DM and HTN, hyperlipidemia (fasting, complete PHQ/GAD).  Wilfred Lacy, NP

## 2020-02-19 ENCOUNTER — Encounter: Payer: Self-pay | Admitting: Nurse Practitioner

## 2020-02-19 LAB — MICROALBUMIN / CREATININE URINE RATIO
Creatinine, Urine: 50 mg/dL (ref 20–275)
Microalb Creat Ratio: 18 mcg/mg creat (ref <30)
Microalb, Ur: 0.9 mg/dL

## 2020-02-19 LAB — HEMOGLOBIN A1C
Hgb A1c MFr Bld: 8 % of total Hgb — ABNORMAL HIGH (ref <5.7)
Mean Plasma Glucose: 183 mg/dL
eAG (mmol/L): 10.1 mmol/L

## 2020-02-19 LAB — COMPREHENSIVE METABOLIC PANEL
AG Ratio: 1.6 (calc) (ref 1.0–2.5)
ALT: 16 U/L (ref 6–29)
AST: 19 U/L (ref 10–35)
Albumin: 4.9 g/dL (ref 3.6–5.1)
Alkaline phosphatase (APISO): 66 U/L (ref 37–153)
BUN: 11 mg/dL (ref 7–25)
CO2: 25 mmol/L (ref 20–32)
Calcium: 10.1 mg/dL (ref 8.6–10.4)
Chloride: 105 mmol/L (ref 98–110)
Creat: 0.9 mg/dL (ref 0.50–0.99)
Globulin: 3 g/dL (calc) (ref 1.9–3.7)
Glucose, Bld: 109 mg/dL — ABNORMAL HIGH (ref 65–99)
Potassium: 4 mmol/L (ref 3.5–5.3)
Sodium: 138 mmol/L (ref 135–146)
Total Bilirubin: 0.4 mg/dL (ref 0.2–1.2)
Total Protein: 7.9 g/dL (ref 6.1–8.1)

## 2020-02-19 LAB — LIPID PANEL
Cholesterol: 222 mg/dL — ABNORMAL HIGH (ref <200)
HDL: 31 mg/dL — ABNORMAL LOW (ref >=50)
LDL Cholesterol (Calc): 146 mg/dL (calc) — ABNORMAL HIGH
Non-HDL Cholesterol (Calc): 191 mg/dL (calc) — ABNORMAL HIGH (ref <130)
Total CHOL/HDL Ratio: 7.2 (calc) — ABNORMAL HIGH (ref <5.0)
Triglycerides: 275 mg/dL — ABNORMAL HIGH (ref <150)

## 2020-02-19 LAB — BRAIN NATRIURETIC PEPTIDE: Brain Natriuretic Peptide: <4 pg/mL (ref <100)

## 2020-02-19 LAB — D-DIMER, QUANTITATIVE: D-Dimer, Quant: 0.54 mcg/mL FEU — ABNORMAL HIGH (ref <0.50)

## 2020-02-19 MED ORDER — MIRTAZAPINE 30 MG PO TABS
30.0000 mg | ORAL_TABLET | Freq: Every day | ORAL | 3 refills | Status: DC
Start: 2020-02-19 — End: 2021-01-24

## 2020-02-19 MED ORDER — CITALOPRAM HYDROBROMIDE 40 MG PO TABS
40.0000 mg | ORAL_TABLET | Freq: Every day | ORAL | 3 refills | Status: DC
Start: 2020-02-19 — End: 2021-03-19

## 2020-02-19 MED ORDER — METFORMIN HCL 850 MG PO TABS: 850.0000 mg | ORAL_TABLET | Freq: Two times a day (BID) | ORAL | 1 refills | Status: DC

## 2020-02-19 NOTE — Assessment & Plan Note (Signed)
Controlled DM with hgbA1c at 8% and normal urine microalbumin. Normal renal and liver function. BP at goal LDL not at goal despite use of zetia, fenofibrate and crestor Continue medications and f/up with cardiology.

## 2020-02-21 ENCOUNTER — Telehealth: Payer: Self-pay | Admitting: Medical Oncology

## 2020-02-21 NOTE — Telephone Encounter (Signed)
Short winded with exertion/night sweats.  Cleaning, walking up 16  steps or talking too long she is out of breath.  -has to sit down. Having bad night sweats -has to change gown , sheets every night.   CT scan -she saw PCP Friday who is setting up CT scan.

## 2020-02-21 NOTE — Telephone Encounter (Signed)
-----   Message from Curt Bears, MD sent at 02/17/2020  4:36 PM EST ----- Regarding: RE: Appointment Requets She will need to have repeat CT scan of the chest as well as blood work and Covid test before her visit with me in 1-2 weeks. If she has significant difficulty with breathing, she may need to go to the emergency room for evaluation. Thank you. ----- Message ----- From: Merrilee Jansky Sent: 02/17/2020   4:28 PM EST To: Curt Bears, MD, Ardeen Garland, RN Subject: Appointment Requets                            Hello,  This patient is requesting an appointment due to having breathing trouble and chest pain. The patient has not had a recent COVID test. The patient has a cancelled appointment from July 2021.

## 2020-02-22 ENCOUNTER — Telehealth: Payer: Self-pay | Admitting: Nurse Practitioner

## 2020-02-22 ENCOUNTER — Other Ambulatory Visit: Payer: PPO

## 2020-02-22 ENCOUNTER — Telehealth: Payer: Self-pay | Admitting: Internal Medicine

## 2020-02-22 NOTE — Progress Notes (Signed)
  Chronic Care Management   Note  02/22/2020 Name: Audrey Russell MRN: [id number] DOB: [birth year]  Audrey Russell is a 65 y.o. year old female who is a primary care patient of [name], [name], NP. I reached out to Deri Fuelling by phone today in response to a referral sent by [name]'s PCP, [name], [name], NP.   [name] was given information about Chronic Care Management services today including:  1. CCM service includes personalized support from designated clinical staff supervised by her physician, including individualized plan of care and coordination with other care providers 2. 24/7 contact phone numbers for assistance for urgent and routine care needs. 3. Service will only be billed when office clinical staff spend 20 minutes or more in a month to coordinate care. 4. Only one practitioner may furnish and bill the service in a calendar month. 5. The patient may stop CCM services at any time (effective at the end of the month) by phone call to the office staff.   Patient agreed to services and verbal consent obtained.   Follow up plan:   Carley Perdue [employer] Scheduler

## 2020-02-22 NOTE — Telephone Encounter (Signed)
Left message with follow-up appointment per 2/1 schedule message. Gave option to call back to reschedule if needed.

## 2020-02-22 NOTE — Progress Notes (Signed)
  Chronic Care Management   Outreach Note  02/22/2020 Name: Audrey Russell MRN: [id number] DOB: 1955/01/27  Referred by: Flossie Buffy, NP Reason for referral : No chief complaint on file.   A second unsuccessful telephone outreach was attempted today. The patient was referred to pharmacist for assistance with care management and care coordination.  Follow Up Plan:   Carley Perdue [employer] Scheduler

## 2020-02-24 ENCOUNTER — Ambulatory Visit
Admission: RE | Admit: 2020-02-24 | Discharge: 2020-02-24 | Disposition: A | Discharge status: Home / Self Care | Payer: PPO | Source: Ambulatory Visit | Attending: Nurse Practitioner | Admitting: Nurse Practitioner

## 2020-02-24 ENCOUNTER — Other Ambulatory Visit: Payer: Self-pay

## 2020-02-24 DIAGNOSIS — R0600 Dyspnea, unspecified: Secondary | ICD-10-CM | POA: Diagnosis not present

## 2020-02-24 DIAGNOSIS — Z86711 Personal history of pulmonary embolism: Secondary | ICD-10-CM | POA: Diagnosis not present

## 2020-02-24 DIAGNOSIS — R0609 Other forms of dyspnea: Secondary | ICD-10-CM

## 2020-02-24 DIAGNOSIS — Z85118 Personal history of other malignant neoplasm of bronchus and lung: Secondary | ICD-10-CM | POA: Diagnosis not present

## 2020-02-24 DIAGNOSIS — R7989 Other specified abnormal findings of blood chemistry: Secondary | ICD-10-CM

## 2020-02-24 DIAGNOSIS — R61 Generalized hyperhidrosis: Secondary | ICD-10-CM | POA: Diagnosis not present

## 2020-02-24 MED ORDER — IOPAMIDOL (ISOVUE-370) INJECTION 76%
75.0000 mL | Freq: Once | INTRAVENOUS | Status: AC | PRN
  Administered 2020-02-24: 75 mL via INTRAVENOUS

## 2020-02-28 ENCOUNTER — Other Ambulatory Visit: Payer: Self-pay

## 2020-02-28 ENCOUNTER — Inpatient Hospital Stay: Payer: PPO | Attending: Internal Medicine | Admitting: Internal Medicine

## 2020-02-28 VITALS — BP 158/91 | HR 87 | Temp 97.8°F | Resp 20 | Ht 69.0 in | Wt 176.8 lb

## 2020-02-28 DIAGNOSIS — R7989 Other specified abnormal findings of blood chemistry: Secondary | ICD-10-CM | POA: Insufficient documentation

## 2020-02-28 DIAGNOSIS — Z7984 Long term (current) use of oral hypoglycemic drugs: Secondary | ICD-10-CM | POA: Insufficient documentation

## 2020-02-28 DIAGNOSIS — J449 Chronic obstructive pulmonary disease, unspecified: Secondary | ICD-10-CM | POA: Insufficient documentation

## 2020-02-28 DIAGNOSIS — J439 Emphysema, unspecified: Secondary | ICD-10-CM | POA: Insufficient documentation

## 2020-02-28 DIAGNOSIS — I2699 Other pulmonary embolism without acute cor pulmonale: Secondary | ICD-10-CM | POA: Insufficient documentation

## 2020-02-28 DIAGNOSIS — E119 Type 2 diabetes mellitus without complications: Secondary | ICD-10-CM | POA: Insufficient documentation

## 2020-02-28 DIAGNOSIS — R0609 Other forms of dyspnea: Secondary | ICD-10-CM | POA: Diagnosis not present

## 2020-02-28 DIAGNOSIS — C3490 Malignant neoplasm of unspecified part of unspecified bronchus or lung: Secondary | ICD-10-CM | POA: Diagnosis not present

## 2020-02-28 DIAGNOSIS — C3491 Malignant neoplasm of unspecified part of right bronchus or lung: Secondary | ICD-10-CM | POA: Diagnosis not present

## 2020-02-28 DIAGNOSIS — C3411 Malignant neoplasm of upper lobe, right bronchus or lung: Secondary | ICD-10-CM | POA: Diagnosis not present

## 2020-02-28 DIAGNOSIS — Z79899 Other long term (current) drug therapy: Secondary | ICD-10-CM | POA: Diagnosis not present

## 2020-02-28 DIAGNOSIS — R61 Generalized hyperhidrosis: Secondary | ICD-10-CM | POA: Insufficient documentation

## 2020-02-28 DIAGNOSIS — Z9049 Acquired absence of other specified parts of digestive tract: Secondary | ICD-10-CM | POA: Insufficient documentation

## 2020-02-28 DIAGNOSIS — I700 Atherosclerosis of aorta: Secondary | ICD-10-CM | POA: Diagnosis not present

## 2020-02-28 DIAGNOSIS — Z902 Acquired absence of lung [part of]: Secondary | ICD-10-CM | POA: Diagnosis not present

## 2020-02-28 DIAGNOSIS — I10 Essential (primary) hypertension: Secondary | ICD-10-CM | POA: Insufficient documentation
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
## 2020-03-02 ENCOUNTER — Telehealth: Payer: Self-pay

## 2020-03-02 NOTE — Progress Notes (Signed)
    Chronic Care Management Pharmacy Assistant   Name: Audrey Russell  MRN: [id number] DOB: [birth year]  Reason for Encounter: Medication Review/Initial question for initial visit with clinical pharmacist on 03/06/2020.  Patient Questions:  1.  Have you seen any other providers since your last visit? No  2.  Any changes in your medicines or health? No   PCP : Flossie Buffy, NP  Allergies:  No Known Allergies  Medications: Outpatient Encounter Medications as of 03/02/2020  Medication Sig  . acetaminophen (TYLENOL) 500 MG tablet Take 1,000 mg by mouth every 6 (six) hours as needed for mild pain or moderate pain.  Marland Kitchen aspirin EC 81 MG tablet Take 1 tablet (81 mg total) by mouth daily.  . budesonide-formoterol (SYMBICORT) 80-4.5 MCG/ACT inhaler Inhale 2 puffs into the lungs every morning.  . citalopram (CELEXA) 40 MG tablet Take 1 tablet (40 mg total) by mouth daily.  Marland Kitchen ezetimibe (ZETIA) 10 MG tablet Take 1 tablet (10 mg total) by mouth every evening.  . famotidine (PEPCID) 20 MG tablet TAKE 1 TABLET BY MOUTH EVERY DAY  . fenofibrate 160 MG tablet Take 1 tablet (160 mg total) by mouth daily.  . fluticasone (FLONASE) 50 MCG/ACT nasal spray Place 2 sprays into both nostrils daily.  Marland Kitchen glipiZIDE (GLUCOTROL XL) 5 MG 24 hr tablet TAKE 1 TABLET (5 MG TOTAL) BY MOUTH DAILY AFTER SUPPER.  . metFORMIN (GLUCOPHAGE) 850 MG tablet Take 1 tablet (850 mg total) by mouth 2 (two) times daily with a meal.  . mirtazapine (REMERON) 30 MG tablet Take 1 tablet (30 mg total) by mouth at bedtime.  Marland Kitchen PROAIR HFA 108 (90 Base) MCG/ACT inhaler TAKE 2 PUFFS BY MOUTH EVERY 6 HOURS AS NEEDED FOR WHEEZE OR SHORTNESS OF BREATH (Patient taking differently: Inhale 2 puffs into the lungs every 6 (six) hours as needed for wheezing or shortness of breath.)  . rosuvastatin (CRESTOR) 10 MG tablet TAKE 1 TABLET BY MOUTH EVERY DAY  . sodium chloride (OCEAN) 0.65 % SOLN nasal spray Place 1 spray into both nostrils as  needed for congestion.   No facility-administered encounter medications on file as of 03/02/2020.    Current Diagnosis: Patient Active Problem List   Diagnosis Date Noted  . Atherosclerosis of aorta ([place]) 02/16/2019  . Onychomycosis 08/11/2018  . Liver disease 02/11/2018  . Hepatic lesion 02/11/2018  . Adenocarcinoma of right lung, stage 1 ([place]) 11/04/2017  . S/P lobectomy of lung 08/15/2017  . DOE (dyspnea on exertion) 06/27/2017  . Former cigarette smoker 06/27/2017  . Pulmonary infiltrates 06/26/2017  . Headache 11/04/2016  . Abdominal pain 05/25/2016  . Bipolar affective ([place]) 05/24/2016  . MS (multiple sclerosis) ([place]) 05/24/2016  . Essential hypertension 04/04/2016  . DM (diabetes mellitus) ([place]) 04/04/2016  . Chronic pain 04/04/2016  . Depression, major, single episode, complete remission ([place]) 04/04/2016  . Hypertriglyceridemia 04/04/2016  . Leg weakness, bilateral 04/04/2016    Goals Addressed   None     Reviewed chart and adherence measures. Per insurance data patient is 80-89 % adherent to rosuvastatin and 90-99% adherent to metformin .   Follow-Up:  Pharmacist Review    Bessie Barberton Pharmacist Assistant [phone]

## 2020-03-06 ENCOUNTER — Ambulatory Visit: Payer: PPO
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
## 2020-04-04 ENCOUNTER — Ambulatory Visit: Payer: PPO

## 2020-04-04 ENCOUNTER — Telehealth: Payer: Self-pay

## 2020-04-04 NOTE — Telephone Encounter (Signed)
Attempted x 3 to reach patient(by caregility-virtually & x 2 by phone) for scheduled medicare wellness exam. No answer. Left message for pt to call back to reschedule.

## 2020-04-20 ENCOUNTER — Other Ambulatory Visit: Payer: Self-pay | Admitting: Nurse Practitioner

## 2020-04-20 DIAGNOSIS — E781 Pure hyperglyceridemia: Secondary | ICD-10-CM

## 2020-07-03 ENCOUNTER — Telehealth: Payer: Self-pay | Admitting: Nurse Practitioner

## 2020-07-03 NOTE — Telephone Encounter (Signed)
Left message for patient to call back and schedule Medicare Annual Wellness Visit (AWV).   Please offer to do virtually or by telephone.   Last AWV:03/31/2019  Please schedule at anytime with Nurse Health Advisor.

## 2020-07-31 ENCOUNTER — Other Ambulatory Visit: Payer: Self-pay | Admitting: Nurse Practitioner

## 2020-07-31 DIAGNOSIS — H6993 Unspecified Eustachian tube disorder, bilateral: Secondary | ICD-10-CM

## 2020-07-31 NOTE — Telephone Encounter (Signed)
Chart supports Rx Last OV 02/18/20 Next OV 08/17/20

## 2020-08-04 ENCOUNTER — Other Ambulatory Visit: Payer: Self-pay | Admitting: Nurse Practitioner

## 2020-08-04 DIAGNOSIS — E1165 Type 2 diabetes mellitus with hyperglycemia: Secondary | ICD-10-CM

## 2020-08-04 NOTE — Telephone Encounter (Signed)
Chart supports Rx

## 2020-08-16 ENCOUNTER — Other Ambulatory Visit: Payer: Self-pay

## 2020-08-17 ENCOUNTER — Encounter: Payer: Self-pay | Admitting: Nurse Practitioner

## 2020-08-17 ENCOUNTER — Ambulatory Visit (INDEPENDENT_AMBULATORY_CARE_PROVIDER_SITE_OTHER): Payer: PPO | Admitting: Nurse Practitioner

## 2020-08-17 VITALS — BP 122/62 | HR 88 | Temp 97.2°F | Wt 168.4 lb

## 2020-08-17 DIAGNOSIS — E781 Pure hyperglyceridemia: Secondary | ICD-10-CM

## 2020-08-17 DIAGNOSIS — E1165 Type 2 diabetes mellitus with hyperglycemia: Secondary | ICD-10-CM

## 2020-08-17 DIAGNOSIS — J449 Chronic obstructive pulmonary disease, unspecified: Secondary | ICD-10-CM | POA: Insufficient documentation

## 2020-08-17 DIAGNOSIS — I10 Essential (primary) hypertension: Secondary | ICD-10-CM | POA: Diagnosis not present

## 2020-08-17 DIAGNOSIS — D126 Benign neoplasm of colon, unspecified: Secondary | ICD-10-CM

## 2020-08-17 DIAGNOSIS — J4489 Other specified chronic obstructive pulmonary disease: Secondary | ICD-10-CM | POA: Insufficient documentation

## 2020-08-17 LAB — HEMOGLOBIN A1C: Hgb A1c MFr Bld: 8.9 % — ABNORMAL HIGH (ref 4.6–6.5)

## 2020-08-17 LAB — HEPATIC FUNCTION PANEL
ALT: 16 U/L (ref 0–35)
AST: 20 U/L (ref 0–37)
Albumin: 4.5 g/dL (ref 3.5–5.2)
Alkaline Phosphatase: 62 U/L (ref 39–117)
Bilirubin, Direct: 0.1 mg/dL (ref 0.0–0.3)
Total Bilirubin: 0.3 mg/dL (ref 0.2–1.2)
Total Protein: 7.7 g/dL (ref 6.0–8.3)

## 2020-08-17 LAB — LIPID PANEL
Cholesterol: 136 mg/dL (ref 0–200)
HDL: 28.3 mg/dL — ABNORMAL LOW (ref >39.00)
NonHDL: 107.2
Total CHOL/HDL Ratio: 5
Triglycerides: 261 mg/dL — ABNORMAL HIGH (ref 0.0–149.0)
VLDL: 52.2 mg/dL — ABNORMAL HIGH (ref 0.0–40.0)

## 2020-08-17 LAB — BASIC METABOLIC PANEL
BUN: 11 mg/dL (ref 6–23)
CO2: 22 mEq/L (ref 19–32)
Calcium: 9.7 mg/dL (ref 8.4–10.5)
Chloride: 105 mEq/L (ref 96–112)
Creatinine, Ser: 0.87 mg/dL (ref 0.40–1.20)
GFR: 70.31 mL/min (ref >60.00)
Glucose, Bld: 207 mg/dL — ABNORMAL HIGH (ref 70–99)
Potassium: 4 mEq/L (ref 3.5–5.1)
Sodium: 137 mEq/L (ref 135–145)

## 2020-08-17 LAB — LDL CHOLESTEROL, DIRECT: Direct LDL: 73 mg/dL

## 2020-08-17 MED ORDER — BUDESONIDE-FORMOTEROL FUMARATE 80-4.5 MCG/ACT IN AERO
2.0000 | INHALATION_SPRAY | Freq: Every day | RESPIRATORY_TRACT | 5 refills | Status: DC
Start: 2020-08-17 — End: 2021-02-19

## 2020-08-17 MED ORDER — CONTOUR NEXT TEST VI STRP: ORAL_STRIP | 11 refills | Status: DC

## 2020-08-17 MED ORDER — BUDESONIDE-FORMOTEROL FUMARATE 80-4.5 MCG/ACT IN AERO
2.0000 | INHALATION_SPRAY | Freq: Two times a day (BID) | RESPIRATORY_TRACT | 5 refills | Status: DC
Start: 2020-08-17 — End: 2020-08-17

## 2020-08-17 MED ORDER — ALBUTEROL SULFATE HFA 108 (90 BASE) MCG/ACT IN AERS: 1.0000 | INHALATION_SPRAY | Freq: Four times a day (QID) | RESPIRATORY_TRACT | 1 refills | Status: DC | PRN

## 2020-08-17 MED ORDER — CETIRIZINE HCL 10 MG PO TABS: 10.0000 mg | ORAL_TABLET | Freq: Every day | ORAL | 0 refills | Status: DC

## 2020-08-17 NOTE — Assessment & Plan Note (Addendum)
Reports intermittent non productive cough and SOB with exertion, onset 02/2020 Has been out of inhalers since march No fever, no LE edema, no night sweats, no chest pain. 8lbs weight loss noted today in last 83months Clear lung sounds BP Readings from Last 3 Encounters:  08/17/20 122/62  02/28/20 (!) 158/91  02/18/20 138/70   CT chest 02/2020: No pulmonary embolism. Status post right upper lobectomy. No evidence of recurrence or residual disease within the thorax. Moderate centrilobular emphysema.  Advised to resume symbicort and albuterol. Refills sent. Advised to schedule appt with pulmonology as soon as possible

## 2020-08-17 NOTE — Patient Instructions (Signed)
Go to lab for blood draw Resume inhalers as prescribed and schedule f/up with pulmonology.  Go to lab for blood draw

## 2020-08-17 NOTE — Assessment & Plan Note (Addendum)
uncontrolled with metformin and glipizide Repeat HgbA1c, BMP and lipid panel. DM is not controlled: HgbA1c of 8.9. maintain metformin and glipizide. add rybelsus 3mg  daily. LDL not at goal with zetia and crestor. Continue f/up with cardiology. Stable renal function. F/up in 35months

## 2020-08-17 NOTE — Assessment & Plan Note (Addendum)
BP at goal without medication BP Readings from Last 3 Encounters:  08/17/20 122/62  02/28/20 (!) 158/91  02/18/20 138/70

## 2020-08-17 NOTE — Progress Notes (Signed)
Subjective:  Patient ID: Audrey Russell, female    DOB: [birth year]  Age: 65 y.o. MRN: [id number]  CC: Follow-up (6 month f/u on HTN, DM and hyperlipidemia. Pt is fasting. )  HPI  Denies any change to GI function. She has hx of tubular adenoma, last colonoscopy 2019. She needs repeat colonoscopy.  Chronic obstructive pulmonary disease (HCC) Reports intermittent non productive cough and SOB with exertion, onset 02/2020 Has been out of inhalers since march No fever, no LE edema, no night sweats, no chest pain. 8lbs weight loss noted today in last 6months Clear lung sounds BP Readings from Last 3 Encounters:  08/17/20 122/62  02/28/20 (!) 158/91  02/18/20 138/70   CT chest 02/2020: No pulmonary embolism. Status post right upper lobectomy. No evidence of recurrence or residual disease within the thorax. Moderate centrilobular emphysema.  Advised to resume symbicort and albuterol. Refills sent. Advised to schedule appt with pulmonology as soon as possible  Essential hypertension BP at goal without medication BP Readings from Last 3 Encounters:  08/17/20 122/62  02/28/20 (!) 158/91  02/18/20 138/70    DM (diabetes mellitus) ([place]) uncontrolled with metformin and glipizide Repeat HgbA1c, BMP and lipid panel. DM is not controlled: HgbA1c of 8.9. maintain metformin and glipizide. add rybelsus 3mg  daily. LDL not at goal with zetia and crestor. Continue f/up with cardiology. Stable renal function. F/up in 7months Wt Readings from Last 3 Encounters:  08/17/20 168 lb 6.4 oz (76.4 kg)  02/28/20 176 lb 12.8 oz (80.2 kg)  02/18/20 171 lb 3.2 oz (77.7 kg)    Reviewed past Medical, Social and Family history today.  Outpatient Medications Prior to Visit  Medication Sig Dispense Refill   acetaminophen (TYLENOL) 500 MG tablet Take 1,000 mg by mouth every 6 (six) hours as needed for mild pain or moderate pain.     aspirin EC 81 MG tablet Take 1 tablet (81 mg total) by mouth  daily. 90 tablet 3   citalopram (CELEXA) 40 MG tablet Take 1 tablet (40 mg total) by mouth daily. 90 tablet 3   ezetimibe (ZETIA) 10 MG tablet TAKE 1 TABLET BY MOUTH EVERY DAY IN THE EVENING 90 tablet 2   famotidine (PEPCID) 20 MG tablet TAKE 1 TABLET BY MOUTH EVERY DAY 90 tablet 3   fenofibrate 160 MG tablet Take 1 tablet (160 mg total) by mouth daily. 90 tablet 3   fluticasone (FLONASE) 50 MCG/ACT nasal spray SPRAY 2 SPRAYS INTO EACH NOSTRIL EVERY DAY 48 mL 1   glipiZIDE (GLUCOTROL XL) 5 MG 24 hr tablet TAKE 1 TABLET (5 MG TOTAL) BY MOUTH DAILY AFTER SUPPER. 90 tablet 1   metFORMIN (GLUCOPHAGE) 850 MG tablet Take 1 tablet (850 mg total) by mouth 2 (two) times daily with a meal. 180 tablet 1   mirtazapine (REMERON) 30 MG tablet Take 1 tablet (30 mg total) by mouth at bedtime. 90 tablet 3   rosuvastatin (CRESTOR) 10 MG tablet TAKE 1 TABLET BY MOUTH EVERY DAY 90 tablet 3   sodium chloride (OCEAN) 0.65 % SOLN nasal spray Place 1 spray into both nostrils as needed for congestion. 15 mL 0   budesonide-formoterol (SYMBICORT) 80-4.5 MCG/ACT inhaler Inhale 2 puffs into the lungs every morning. 1 Inhaler 12   PROAIR HFA 108 (90 Base) MCG/ACT inhaler TAKE 2 PUFFS BY MOUTH EVERY 6 HOURS AS NEEDED FOR WHEEZE OR SHORTNESS OF BREATH (Patient taking differently: Inhale 2 puffs into the lungs every 6 (six) hours as needed for wheezing or  shortness of breath.) 8.5 g 5   No facility-administered medications prior to visit.    ROS See HPI  Objective:  BP 122/62 (BP Location: Left Arm, Patient Position: Sitting, Cuff Size: Large)   Pulse 88   Temp (!) 97.2 F (36.2 C) (Temporal)   Wt 168 lb 6.4 oz (76.4 kg)   SpO2 97%   BMI 24.87 kg/m   Physical Exam Vitals reviewed.  Cardiovascular:     Rate and Rhythm: Normal rate and regular rhythm.     Pulses: Normal pulses.     Heart sounds: Normal heart sounds.  Pulmonary:     Effort: Pulmonary effort is normal.     Breath sounds: Normal breath sounds.   Musculoskeletal:     Right lower leg: No edema.     Left lower leg: No edema.  Neurological:     Mental Status: She is alert and oriented to person, place, and time.   Assessment & Plan:  This visit occurred during the SARS-CoV-2 public health emergency.  Safety protocols were in place, including screening questions prior to the visit, additional usage of staff PPE, and extensive cleaning of exam room while observing appropriate contact time as indicated for disinfecting solutions.   [name] was seen today for follow-up.  Diagnoses and all orders for this visit:  Essential hypertension -     Basic metabolic panel  Type 2 diabetes mellitus with hyperglycemia, without long-term current use of insulin (HCC) -     Hepatic function panel -     Hemoglobin A1c -     Basic metabolic panel -     Discontinue: glucose blood (CONTOUR NEXT TEST) test strip; Check glucose before breakfast. E11.65 -     Semaglutide (RYBELSUS) 3 MG TABS; Take 1 tablet by mouth daily.  Chronic obstructive pulmonary disease, unspecified COPD type (HCC) -     Discontinue: budesonide-formoterol (SYMBICORT) 80-4.5 MCG/ACT inhaler; Inhale 2 puffs into the lungs 2 (two) times daily. Rinse mouth after each use -     albuterol (PROAIR HFA) 108 (90 Base) MCG/ACT inhaler; Inhale 1-2 puffs into the lungs every 6 (six) hours as needed for wheezing or shortness of breath. TAKE 2 PUFFS BY MOUTH EVERY 6 HOURS AS NEEDED FOR WHEEZE OR SHORTNESS OF BREATH -     cetirizine (ZYRTEC) 10 MG tablet; Take 1 tablet (10 mg total) by mouth at bedtime. -     budesonide-formoterol (SYMBICORT) 80-4.5 MCG/ACT inhaler; Inhale 2 puffs into the lungs daily. Rinse mouth after each use  Hypertriglyceridemia -     Hepatic function panel -     Lipid panel  Tubular adenoma of colon -     Ambulatory referral to Gastroenterology  Other orders -     LDL cholesterol, direct  Problem List Items Addressed This Visit       Cardiovascular and  Mediastinum   Essential hypertension - Primary    BP at goal without medication BP Readings from Last 3 Encounters:  08/17/20 122/62  02/28/20 (!) 158/91  02/18/20 138/70        Relevant Orders   Basic metabolic panel (Completed)     Respiratory   Chronic obstructive pulmonary disease ([place])    Reports intermittent non productive cough and SOB with exertion, onset 02/2020 Has been out of inhalers since march No fever, no LE edema, no night sweats, no chest pain. 8lbs weight loss noted today in last 81months Clear lung sounds BP Readings from Last 3 Encounters:  08/17/20 122/62  02/28/20 (!) 158/91  02/18/20 138/70   CT chest 02/2020: No pulmonary embolism. Status post right upper lobectomy. No evidence of recurrence or residual disease within the thorax. Moderate centrilobular emphysema.  Advised to resume symbicort and albuterol. Refills sent. Advised to schedule appt with pulmonology as soon as possible      Relevant Medications   albuterol (PROAIR HFA) 108 (90 Base) MCG/ACT inhaler   cetirizine (ZYRTEC) 10 MG tablet   budesonide-formoterol (SYMBICORT) 80-4.5 MCG/ACT inhaler     Digestive   Tubular adenoma of colon   Relevant Orders   Ambulatory referral to Gastroenterology     Endocrine   DM (diabetes mellitus) ([place])    uncontrolled with metformin and glipizide Repeat HgbA1c, BMP and lipid panel. DM is not controlled: HgbA1c of 8.9. maintain metformin and glipizide. add rybelsus 3mg  daily. LDL not at goal with zetia and crestor. Continue f/up with cardiology. Stable renal function. F/up in 91months        Relevant Medications   Semaglutide (RYBELSUS) 3 MG TABS   Other Relevant Orders   Hepatic function panel (Completed)   Hemoglobin A1c (Completed)   Basic metabolic panel (Completed)     Other   Hypertriglyceridemia   Relevant Orders   Hepatic function panel (Completed)   Lipid panel (Completed)    Follow-up: Return in about 3 months (around  11/17/2020) for DM and HTN, hyperlipidemia (fasting).  Wilfred Lacy, NP

## 2020-08-18 ENCOUNTER — Encounter: Payer: Self-pay | Admitting: Nurse Practitioner

## 2020-08-18 ENCOUNTER — Telehealth: Payer: Self-pay | Admitting: Nurse Practitioner

## 2020-08-18 DIAGNOSIS — E1165 Type 2 diabetes mellitus with hyperglycemia: Secondary | ICD-10-CM

## 2020-08-18 MED ORDER — CONTOUR NEXT TEST VI STRP: ORAL_STRIP | 11 refills | Status: DC

## 2020-08-18 MED ORDER — RYBELSUS 3 MG PO TABS: 1.0000 | ORAL_TABLET | Freq: Every day | ORAL | 2 refills | Status: DC

## 2020-08-18 NOTE — Telephone Encounter (Signed)
Patient notified VIA phone.  No questions.  Dm/cma ? ?

## 2020-08-18 NOTE — Telephone Encounter (Signed)
Pt was told to call her provider and let them know to glucose blood (CONTOUR NEXT TEST) test strip [[id number]] needs to be sent to [facility] on [address]. She can get her scripts for free. Please advise pt

## 2020-08-28 ENCOUNTER — Other Ambulatory Visit: Payer: Self-pay | Admitting: Nurse Practitioner

## 2020-08-28 DIAGNOSIS — E781 Pure hyperglyceridemia: Secondary | ICD-10-CM

## 2020-08-29 NOTE — Telephone Encounter (Signed)
Chart supports rx refill Last ov: 08/17/2020 Last refill: 05/21/2020

## 2020-09-22 ENCOUNTER — Encounter: Payer: Self-pay | Admitting: Internal Medicine

## 2020-10-17 ENCOUNTER — Other Ambulatory Visit: Payer: Self-pay | Admitting: Nurse Practitioner

## 2020-10-17 DIAGNOSIS — J449 Chronic obstructive pulmonary disease, unspecified: Secondary | ICD-10-CM

## 2020-10-18 ENCOUNTER — Other Ambulatory Visit: Payer: Self-pay | Admitting: Nurse Practitioner

## 2020-10-23 ENCOUNTER — Telehealth: Payer: Self-pay | Admitting: Nurse Practitioner

## 2020-10-23 NOTE — Telephone Encounter (Signed)
Pt called back   What is the name of the medication? one touch precise styling monitor and test strips, her insurance will pay for this   Have you contacted your pharmacy to request a refill? Pt does not want the Contour system  Which pharmacy would you like this sent to? Pharmacy  [id number] - [place], [address] [place], [place] [address]  Phone:  [phone]  Fax:  [phone]  DEA #:  [id number]     Patient notified that their request is being sent to the clinical staff for review and that they should receive a call once it is complete. If they do not receive a call within 72 hours they can check with their pharmacy or our office.

## 2020-10-23 NOTE — Telephone Encounter (Signed)
Patient needs her med for pepcid  refilled.

## 2020-10-23 NOTE — Telephone Encounter (Signed)
Can you send in for the one touch instead of the contour testing meter?

## 2020-11-02 ENCOUNTER — Other Ambulatory Visit: Payer: Self-pay | Admitting: Nurse Practitioner

## 2020-11-02 DIAGNOSIS — E781 Pure hyperglyceridemia: Secondary | ICD-10-CM

## 2020-11-24 ENCOUNTER — Other Ambulatory Visit: Payer: Self-pay

## 2020-11-24 ENCOUNTER — Ambulatory Visit (INDEPENDENT_AMBULATORY_CARE_PROVIDER_SITE_OTHER): Payer: PPO | Admitting: Nurse Practitioner

## 2020-11-24 ENCOUNTER — Encounter: Payer: Self-pay | Admitting: Nurse Practitioner

## 2020-11-24 VITALS — BP 126/60 | HR 74 | Temp 97.3°F | Wt 161.0 lb

## 2020-11-24 DIAGNOSIS — E781 Pure hyperglyceridemia: Secondary | ICD-10-CM

## 2020-11-24 DIAGNOSIS — Z23 Encounter for immunization: Secondary | ICD-10-CM | POA: Diagnosis not present

## 2020-11-24 DIAGNOSIS — I10 Essential (primary) hypertension: Secondary | ICD-10-CM | POA: Diagnosis not present

## 2020-11-24 DIAGNOSIS — E1165 Type 2 diabetes mellitus with hyperglycemia: Secondary | ICD-10-CM | POA: Diagnosis not present

## 2020-11-24 LAB — HEPATIC FUNCTION PANEL
ALT: 16 U/L (ref 0–35)
AST: 19 U/L (ref 0–37)
Albumin: 4.5 g/dL (ref 3.5–5.2)
Alkaline Phosphatase: 63 U/L (ref 39–117)
Bilirubin, Direct: 0.1 mg/dL (ref 0.0–0.3)
Total Bilirubin: 0.3 mg/dL (ref 0.2–1.2)
Total Protein: 7.6 g/dL (ref 6.0–8.3)

## 2020-11-24 LAB — LIPID PANEL
Cholesterol: 144 mg/dL (ref 0–200)
HDL: 34.2 mg/dL — ABNORMAL LOW (ref >39.00)
LDL Cholesterol: 70 mg/dL (ref 0–99)
NonHDL: 109.61
Total CHOL/HDL Ratio: 4
Triglycerides: 197 mg/dL — ABNORMAL HIGH (ref 0.0–149.0)
VLDL: 39.4 mg/dL (ref 0.0–40.0)

## 2020-11-24 LAB — BASIC METABOLIC PANEL
BUN: 13 mg/dL (ref 6–23)
CO2: 25 mEq/L (ref 19–32)
Calcium: 9.6 mg/dL (ref 8.4–10.5)
Chloride: 105 mEq/L (ref 96–112)
Creatinine, Ser: 0.85 mg/dL (ref 0.40–1.20)
GFR: 72.17 mL/min (ref >60.00)
Glucose, Bld: 209 mg/dL — ABNORMAL HIGH (ref 70–99)
Potassium: 4.4 mEq/L (ref 3.5–5.1)
Sodium: 138 mEq/L (ref 135–145)

## 2020-11-24 LAB — HEMOGLOBIN A1C: Hgb A1c MFr Bld: 7.6 % — ABNORMAL HIGH (ref 4.6–6.5)

## 2020-11-24 LAB — TSH: TSH: 0.97 u[IU]/mL (ref 0.35–5.50)

## 2020-11-24 MED ORDER — BLOOD GLUCOSE METER KIT: PACK | 0 refills | Status: DC

## 2020-11-24 NOTE — Assessment & Plan Note (Signed)
BP remains at goal BP Readings from Last 3 Encounters:  11/24/20 126/60  08/17/20 122/62  02/28/20 (!) 158/91

## 2020-11-24 NOTE — Patient Instructions (Signed)
Schedule appt for eye exam, mammogram( [phone]) and colonoscopy. Have eye exam report faxed to me once completed.  Go to lab for blood draw.

## 2020-11-24 NOTE — Assessment & Plan Note (Addendum)
Denies any adverse reaction with rybelsus No glucose check at home.  Provided glucometer prescription. Repeat hgbA1c, CMP and lipid panel: Normal Tsh, renal and liver function Improved hgbA1c: 8.9 to 7.6 Lipid panel continues to improve Maintain current medications, heart healthy diet and daily exercise. F/up in 31months (fasting)  Will schedule appt for annual Dm eye exam

## 2020-11-24 NOTE — Progress Notes (Signed)
Subjective:  Patient ID: Audrey Russell, female    DOB: 1955/11/02  Age: 65 y.o. MRN: [id number]  CC: Follow-up (3 month f/u on DM and HTN. /Pt has not been checking blood sugars at home because she states she needs a certain meter sent into her pharmacy that is covered by her insurance. )  HPI  DM (diabetes mellitus) (HCC) Denies any adverse reaction with rybelsus No glucose check at home.  Provided glucometer prescription. Repeat hgbA1c, CMP and lipid panel: Normal Tsh, renal and liver function Improved hgbA1c: 8.9 to 7.6 Lipid panel continues to improve Maintain current medications, heart healthy diet and daily exercise. F/up in 52months (fasting)  Will schedule appt for annual Dm eye exam  Essential hypertension BP remains at goal BP Readings from Last 3 Encounters:  11/24/20 126/60  08/17/20 122/62  02/28/20 (!) 158/91   Wt Readings from Last 3 Encounters:  11/24/20 161 lb (73 kg)  08/17/20 168 lb 6.4 oz (76.4 kg)  02/28/20 176 lb 12.8 oz (80.2 kg)    BP Readings from Last 3 Encounters:  11/24/20 126/60  08/17/20 122/62  02/28/20 (!) 158/91    Reviewed past Medical, Social and Family history today.  Outpatient Medications Prior to Visit  Medication Sig Dispense Refill   acetaminophen (TYLENOL) 500 MG tablet Take 1,000 mg by mouth every 6 (six) hours as needed for mild pain or moderate pain.     albuterol (PROAIR HFA) 108 (90 Base) MCG/ACT inhaler Inhale 1-2 puffs into the lungs every 6 (six) hours as needed for wheezing or shortness of breath. TAKE 2 PUFFS BY MOUTH EVERY 6 HOURS AS NEEDED FOR WHEEZE OR SHORTNESS OF BREATH 8 g 1   aspirin EC 81 MG tablet Take 1 tablet (81 mg total) by mouth daily. 90 tablet 3   budesonide-formoterol (SYMBICORT) 80-4.5 MCG/ACT inhaler Inhale 2 puffs into the lungs daily. Rinse mouth after each use 1 each 5   cetirizine (ZYRTEC) 10 MG tablet Take 1 tablet (10 mg total) by mouth at bedtime. 30 tablet 0   citalopram (CELEXA)  40 MG tablet Take 1 tablet (40 mg total) by mouth daily. 90 tablet 3   ezetimibe (ZETIA) 10 MG tablet TAKE 1 TABLET BY MOUTH EVERY DAY IN THE EVENING 90 tablet 2   famotidine (PEPCID) 20 MG tablet TAKE 1 TABLET BY MOUTH EVERY DAY 90 tablet 3   fenofibrate 160 MG tablet TAKE 1 TABLET BY MOUTH EVERY DAY 90 tablet 3   fluticasone (FLONASE) 50 MCG/ACT nasal spray SPRAY 2 SPRAYS INTO EACH NOSTRIL EVERY DAY 48 mL 1   glipiZIDE (GLUCOTROL XL) 5 MG 24 hr tablet TAKE 1 TABLET (5 MG TOTAL) BY MOUTH DAILY AFTER SUPPER. 90 tablet 1   glucose blood (CONTOUR NEXT TEST) test strip 1 each by Other route as needed for other. Use as instructed     glucose blood (CONTOUR NEXT TEST) test strip Check glucose before breakfast. E11.65 100 each 11   mirtazapine (REMERON) 30 MG tablet Take 1 tablet (30 mg total) by mouth at bedtime. 90 tablet 3   rosuvastatin (CRESTOR) 10 MG tablet TAKE 1 TABLET BY MOUTH EVERY DAY 90 tablet 3   sodium chloride (OCEAN) 0.65 % SOLN nasal spray Place 1 spray into both nostrils as needed for congestion. 15 mL 0   metFORMIN (GLUCOPHAGE) 850 MG tablet Take 1 tablet (850 mg total) by mouth 2 (two) times daily with a meal. 180 tablet 1   Semaglutide (RYBELSUS) 3 MG TABS  Take 1 tablet by mouth daily. 30 tablet 2   No facility-administered medications prior to visit.    ROS See HPI  Objective:  BP 126/60 (BP Location: Left Arm, Patient Position: Sitting, Cuff Size: Large)   Pulse 74   Temp (!) 97.3 F (36.3 C) (Temporal)   Wt 161 lb (73 kg)   BMI 23.78 kg/m   Physical Exam Vitals reviewed.  Cardiovascular:     Rate and Rhythm: Normal rate and regular rhythm.     Pulses: Normal pulses.     Heart sounds: Normal heart sounds.  Pulmonary:     Effort: Pulmonary effort is normal.     Breath sounds: Wheezing present. No rales.  Chest:     Chest wall: No tenderness.  Abdominal:     General: There is no distension.     Palpations: Abdomen is soft.     Tenderness: There is no  abdominal tenderness.  Musculoskeletal:     Right lower leg: No edema.     Left lower leg: No edema.  Neurological:     Mental Status: She is alert and oriented to person, place, and time.   Assessment & Plan:  This visit occurred during the SARS-CoV-2 public health emergency.  Safety protocols were in place, including screening questions prior to the visit, additional usage of staff PPE, and extensive cleaning of exam room while observing appropriate contact time as indicated for disinfecting solutions.   Devlynn was seen today for follow-up.  Diagnoses and all orders for this visit:  Type 2 diabetes mellitus with hyperglycemia, without long-term current use of insulin (HCC) -     blood glucose meter kit and supplies; One Touch Contour Monitor. Check glucose in AM, before breakfast. E11.65 -     Hemoglobin A1c -     Hepatic function panel -     Basic metabolic panel -     metFORMIN (GLUCOPHAGE) 850 MG tablet; Take 1 tablet (850 mg total) by mouth 2 (two) times daily with a meal. -     Semaglutide (RYBELSUS) 3 MG TABS; Take 1 tablet by mouth daily.  Essential hypertension -     Basic metabolic panel  Hypertriglyceridemia -     Hepatic function panel -     Lipid panel -     TSH  Need for shingles vaccine -     Varicella-zoster vaccine IM   Problem List Items Addressed This Visit       Cardiovascular and Mediastinum   Essential hypertension    BP remains at goal BP Readings from Last 3 Encounters:  11/24/20 126/60  08/17/20 122/62  02/28/20 (!) 158/91        Relevant Orders   Basic metabolic panel (Completed)     Endocrine   DM (diabetes mellitus) ([place]) - Primary    Denies any adverse reaction with rybelsus No glucose check at home.  Provided glucometer prescription. Repeat hgbA1c, CMP and lipid panel: Normal Tsh, renal and liver function Improved hgbA1c: 8.9 to 7.6 Lipid panel continues to improve Maintain current medications, heart healthy diet and daily  exercise. F/up in 32months (fasting)  Will schedule appt for annual Dm eye exam      Relevant Medications   blood glucose meter kit and supplies   metFORMIN (GLUCOPHAGE) 850 MG tablet   Semaglutide (RYBELSUS) 3 MG TABS   Other Relevant Orders   Hemoglobin A1c (Completed)   Hepatic function panel (Completed)   Basic metabolic panel (Completed)  Other   Hypertriglyceridemia   Relevant Orders   Hepatic function panel (Completed)   Lipid panel (Completed)   TSH (Completed)   Other Visit Diagnoses     Need for shingles vaccine       Relevant Orders   Varicella-zoster vaccine IM (Completed)       Follow-up: Return in about 3 months (around 02/24/2021) for DM and HTN, hyperlipidemia (fasting).  Wilfred Lacy, NP

## 2020-11-26 MED ORDER — METFORMIN HCL 850 MG PO TABS: 850.0000 mg | ORAL_TABLET | Freq: Two times a day (BID) | ORAL | 3 refills | Status: DC

## 2020-11-26 MED ORDER — RYBELSUS 3 MG PO TABS: 1.0000 | ORAL_TABLET | Freq: Every day | ORAL | 3 refills | Status: DC

## 2020-12-21 MED ORDER — ALBUTEROL SULFATE HFA 108 (90 BASE) MCG/ACT IN AERS: 1.0000 | INHALATION_SPRAY | Freq: Four times a day (QID) | RESPIRATORY_TRACT | 1 refills | Status: DC | PRN

## 2020-12-21 NOTE — Telephone Encounter (Signed)
Chart supports rx refill Last ov: 10/04/2020

## 2020-12-28 ENCOUNTER — Other Ambulatory Visit: Payer: Self-pay | Admitting: Internal Medicine

## 2021-01-02 ENCOUNTER — Other Ambulatory Visit: Payer: Self-pay | Admitting: Nurse Practitioner

## 2021-01-02 DIAGNOSIS — J449 Chronic obstructive pulmonary disease, unspecified: Secondary | ICD-10-CM

## 2021-01-04 NOTE — Telephone Encounter (Signed)
Duplicate request Filled 12/21/20 with 1 refill

## 2021-01-11 ENCOUNTER — Other Ambulatory Visit: Payer: Self-pay | Admitting: Nurse Practitioner

## 2021-01-13 ENCOUNTER — Other Ambulatory Visit: Payer: Self-pay | Admitting: Internal Medicine

## 2021-01-23 ENCOUNTER — Other Ambulatory Visit: Payer: Self-pay | Admitting: Nurse Practitioner

## 2021-01-23 DIAGNOSIS — F325 Major depressive disorder, single episode, in full remission: Secondary | ICD-10-CM

## 2021-01-23 DIAGNOSIS — E781 Pure hyperglyceridemia: Secondary | ICD-10-CM

## 2021-01-29 ENCOUNTER — Other Ambulatory Visit: Payer: Self-pay | Admitting: Nurse Practitioner

## 2021-01-29 DIAGNOSIS — J449 Chronic obstructive pulmonary disease, unspecified: Secondary | ICD-10-CM

## 2021-02-14 ENCOUNTER — Other Ambulatory Visit: Payer: Self-pay | Admitting: Internal Medicine

## 2021-02-19 ENCOUNTER — Encounter: Payer: Medicare HMO | Admitting: Nurse Practitioner

## 2021-02-19 ENCOUNTER — Encounter: Payer: Self-pay | Admitting: Internal Medicine

## 2021-02-19 ENCOUNTER — Other Ambulatory Visit: Payer: Self-pay

## 2021-02-19 ENCOUNTER — Encounter: Payer: Self-pay | Admitting: Nurse Practitioner

## 2021-02-23 ENCOUNTER — Ambulatory Visit (HOSPITAL_COMMUNITY)
Admission: RE | Admit: 2021-02-23 | Discharge: 2021-02-23 | Disposition: A | Discharge status: Home / Self Care | Payer: Medicare HMO | Source: Ambulatory Visit | Attending: Internal Medicine | Admitting: Internal Medicine

## 2021-02-23 ENCOUNTER — Other Ambulatory Visit: Payer: Self-pay

## 2021-02-23 ENCOUNTER — Inpatient Hospital Stay: Payer: Medicare HMO | Attending: Internal Medicine

## 2021-02-23 ENCOUNTER — Encounter (HOSPITAL_COMMUNITY): Payer: Self-pay

## 2021-02-23 DIAGNOSIS — Z902 Acquired absence of lung [part of]: Secondary | ICD-10-CM | POA: Insufficient documentation

## 2021-02-23 DIAGNOSIS — I700 Atherosclerosis of aorta: Secondary | ICD-10-CM | POA: Diagnosis not present

## 2021-02-23 DIAGNOSIS — E119 Type 2 diabetes mellitus without complications: Secondary | ICD-10-CM | POA: Insufficient documentation

## 2021-02-23 DIAGNOSIS — Z79899 Other long term (current) drug therapy: Secondary | ICD-10-CM | POA: Insufficient documentation

## 2021-02-23 DIAGNOSIS — Z9049 Acquired absence of other specified parts of digestive tract: Secondary | ICD-10-CM | POA: Insufficient documentation

## 2021-02-23 DIAGNOSIS — R160 Hepatomegaly, not elsewhere classified: Secondary | ICD-10-CM | POA: Insufficient documentation

## 2021-02-23 DIAGNOSIS — J439 Emphysema, unspecified: Secondary | ICD-10-CM | POA: Diagnosis not present

## 2021-02-23 DIAGNOSIS — C3490 Malignant neoplasm of unspecified part of unspecified bronchus or lung: Secondary | ICD-10-CM | POA: Insufficient documentation

## 2021-02-23 DIAGNOSIS — R0602 Shortness of breath: Secondary | ICD-10-CM | POA: Insufficient documentation

## 2021-02-23 DIAGNOSIS — I10 Essential (primary) hypertension: Secondary | ICD-10-CM | POA: Insufficient documentation

## 2021-02-23 DIAGNOSIS — R0609 Other forms of dyspnea: Secondary | ICD-10-CM | POA: Insufficient documentation

## 2021-02-23 DIAGNOSIS — R062 Wheezing: Secondary | ICD-10-CM | POA: Insufficient documentation

## 2021-02-23 DIAGNOSIS — C3411 Malignant neoplasm of upper lobe, right bronchus or lung: Secondary | ICD-10-CM | POA: Insufficient documentation

## 2021-02-23 DIAGNOSIS — J432 Centrilobular emphysema: Secondary | ICD-10-CM | POA: Insufficient documentation

## 2021-02-23 DIAGNOSIS — K760 Fatty (change of) liver, not elsewhere classified: Secondary | ICD-10-CM | POA: Insufficient documentation

## 2021-02-23 LAB — CMP (CANCER CENTER ONLY)
ALT: 15 U/L (ref 0–44)
AST: 18 U/L (ref 15–41)
Albumin: 4.3 g/dL (ref 3.5–5.0)
Alkaline Phosphatase: 69 U/L (ref 38–126)
Anion gap: 6 (ref 5–15)
BUN: 10 mg/dL (ref 8–23)
CO2: 26 mmol/L (ref 22–32)
Calcium: 9.5 mg/dL (ref 8.9–10.3)
Chloride: 106 mmol/L (ref 98–111)
Creatinine: 0.79 mg/dL (ref 0.44–1.00)
GFR, Estimated: >60 mL/min (ref >60)
Glucose, Bld: 198 mg/dL — ABNORMAL HIGH (ref 70–99)
Potassium: 4.2 mmol/L (ref 3.5–5.1)
Sodium: 138 mmol/L (ref 135–145)
Total Bilirubin: 0.3 mg/dL (ref 0.3–1.2)
Total Protein: 7.8 g/dL (ref 6.5–8.1)

## 2021-02-23 LAB — CBC WITH DIFFERENTIAL (CANCER CENTER ONLY)
Abs Immature Granulocytes: 0.04 10*3/uL (ref 0.00–0.07)
Basophils Absolute: 0.1 10*3/uL (ref 0.0–0.1)
Basophils Relative: 1 %
Eosinophils Absolute: 0.2 10*3/uL (ref 0.0–0.5)
Eosinophils Relative: 2 %
HCT: 45.5 % (ref 36.0–46.0)
Hemoglobin: 14.8 g/dL (ref 12.0–15.0)
Immature Granulocytes: 0 %
Lymphocytes Relative: 32 %
Lymphs Abs: 2.9 10*3/uL (ref 0.7–4.0)
MCH: 27.9 pg (ref 26.0–34.0)
MCHC: 32.5 g/dL (ref 30.0–36.0)
MCV: 85.7 fL (ref 80.0–100.0)
Monocytes Absolute: 0.7 10*3/uL (ref 0.1–1.0)
Monocytes Relative: 8 %
Neutro Abs: 5.2 10*3/uL (ref 1.7–7.7)
Neutrophils Relative %: 57 %
Platelet Count: 283 10*3/uL (ref 150–400)
RBC: 5.31 MIL/uL — ABNORMAL HIGH (ref 3.87–5.11)
RDW: 14.1 % (ref 11.5–15.5)
WBC Count: 9.2 10*3/uL (ref 4.0–10.5)
nRBC: 0 % (ref 0.0–0.2)

## 2021-02-23 MED ORDER — IOHEXOL 300 MG/ML  SOLN
75.0000 mL | Freq: Once | INTRAMUSCULAR | Status: AC | PRN
  Administered 2021-02-23: 75 mL via INTRAVENOUS

## 2021-02-23 MED ORDER — SODIUM CHLORIDE (PF) 0.9 % IJ SOLN
INTRAMUSCULAR | Status: AC
  Filled 2021-02-23: qty 50

## 2021-02-23 NOTE — Progress Notes (Signed)
Error, not seenThis encounter was created in error - please disregard.

## 2021-02-26 ENCOUNTER — Inpatient Hospital Stay: Payer: Medicare HMO | Admitting: Internal Medicine

## 2021-02-26 ENCOUNTER — Other Ambulatory Visit: Payer: Self-pay

## 2021-02-26 VITALS — BP 146/81 | HR 97 | Temp 97.5°F | Resp 18 | Ht 69.0 in | Wt 157.7 lb

## 2021-02-26 DIAGNOSIS — I700 Atherosclerosis of aorta: Secondary | ICD-10-CM | POA: Diagnosis not present

## 2021-02-26 DIAGNOSIS — R0602 Shortness of breath: Secondary | ICD-10-CM | POA: Diagnosis not present

## 2021-02-26 DIAGNOSIS — C3490 Malignant neoplasm of unspecified part of unspecified bronchus or lung: Secondary | ICD-10-CM | POA: Diagnosis not present

## 2021-02-26 DIAGNOSIS — J432 Centrilobular emphysema: Secondary | ICD-10-CM | POA: Diagnosis not present

## 2021-02-26 DIAGNOSIS — C3411 Malignant neoplasm of upper lobe, right bronchus or lung: Secondary | ICD-10-CM | POA: Diagnosis not present

## 2021-02-26 DIAGNOSIS — R0609 Other forms of dyspnea: Secondary | ICD-10-CM | POA: Diagnosis not present

## 2021-02-26 DIAGNOSIS — C3491 Malignant neoplasm of unspecified part of right bronchus or lung: Secondary | ICD-10-CM

## 2021-02-26 DIAGNOSIS — R062 Wheezing: Secondary | ICD-10-CM | POA: Diagnosis not present

## 2021-02-26 DIAGNOSIS — K760 Fatty (change of) liver, not elsewhere classified: Secondary | ICD-10-CM | POA: Diagnosis not present

## 2021-02-26 DIAGNOSIS — Z902 Acquired absence of lung [part of]: Secondary | ICD-10-CM | POA: Diagnosis not present

## 2021-02-26 DIAGNOSIS — I10 Essential (primary) hypertension: Secondary | ICD-10-CM | POA: Diagnosis not present

## 2021-02-26 DIAGNOSIS — R160 Hepatomegaly, not elsewhere classified: Secondary | ICD-10-CM | POA: Diagnosis not present

## 2021-02-26 DIAGNOSIS — Z79899 Other long term (current) drug therapy: Secondary | ICD-10-CM | POA: Diagnosis not present

## 2021-02-26 DIAGNOSIS — E119 Type 2 diabetes mellitus without complications: Secondary | ICD-10-CM | POA: Diagnosis not present

## 2021-02-26 DIAGNOSIS — Z9049 Acquired absence of other specified parts of digestive tract: Secondary | ICD-10-CM | POA: Diagnosis not present
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
## 2021-03-01 ENCOUNTER — Other Ambulatory Visit: Payer: Self-pay

## 2021-03-01 ENCOUNTER — Ambulatory Visit: Payer: Medicare HMO | Admitting: Internal Medicine

## 2021-03-01 ENCOUNTER — Ambulatory Visit: Payer: PPO | Admitting: Nurse Practitioner

## 2021-03-01 ENCOUNTER — Encounter: Payer: Self-pay | Admitting: Internal Medicine

## 2021-03-01 DIAGNOSIS — J449 Chronic obstructive pulmonary disease, unspecified: Secondary | ICD-10-CM | POA: Diagnosis not present

## 2021-03-01 DIAGNOSIS — R918 Other nonspecific abnormal finding of lung field: Secondary | ICD-10-CM

## 2021-03-01 MED ORDER — PREDNISONE 10 MG PO TABS: ORAL_TABLET | ORAL | 0 refills | Status: DC

## 2021-03-01 MED ORDER — AMOXICILLIN-POT CLAVULANATE 875-125 MG PO TABS: 1.0000 | ORAL_TABLET | Freq: Two times a day (BID) | ORAL | 0 refills | Status: AC

## 2021-03-01 MED ORDER — BUDESONIDE-FORMOTEROL FUMARATE 80-4.5 MCG/ACT IN AERO: INHALATION_SPRAY | RESPIRATORY_TRACT | 12 refills | Status: DC

## 2021-03-01 MED ORDER — PANTOPRAZOLE SODIUM 40 MG PO TBEC: 40.0000 mg | DELAYED_RELEASE_TABLET | Freq: Every day | ORAL | 2 refills | Status: DC

## 2021-03-01 NOTE — Patient Instructions (Addendum)
Pantoprazole (protonix) 40 mg   Take  30-60 min before first meal of the day and Pepcid (famotidine)  20 mg after supper until return to office - this is the best way to tell whether stomach acid is contributing to your problem.    GERD (REFLUX)  is an extremely common cause of respiratory symptoms just like yours , many times with no obvious heartburn at all.    It can be treated with medication, but also with lifestyle changes including elevation of the head of your bed (ideally with 6 -8inch blocks under the headboard of your bed),  Smoking cessation, avoidance of late meals, excessive alcohol, and avoid fatty foods, chocolate, peppermint, colas, red wine, and acidic juices such as orange juice.  NO MINT OR MENTHOL PRODUCTS SO NO COUGH DROPS  USE SUGARLESS CANDY INSTEAD (Jolley ranchers or Stover's or Life Savers) or even ice chips will also do - the key is to swallow to prevent all throat clearing. NO OIL BASED VITAMINS - use powdered substitutes.  Avoid fish oil when coughing.   Prednisone 10 mg take  4 each am x 2 days,   2 each am x 2 days,  1 each am x 2 days and stop   Augmentin 875 mg take one pill twice daily  X 10 days - take at breakfast and supper with large glass of water.  It would help reduce the usual side effects (diarrhea and yeast infections) if you ate cultured yogurt at lunch.   Plan A = Automatic = Always=    Symbicort 80 Take 2 puffs first thing in am and then another 2 puffs about 12 hours later.    Work on inhaler technique:  relax and gently blow all the way out then take a nice smooth full deep breath back in, triggering the inhaler at same time you start breathing in.  Hold for up to 5 seconds if you can. Blow out thru nose. Rinse and gargle with water when done.  If mouth or throat bother you at all,  try brushing teeth/gums/tongue with arm and hammer toothpaste/ make a slurry and gargle and spit out.       Plan B = Backup (to supplement plan A, not to replace  it) Only use your albuterol inhaler as a rescue medication to be used if you can't catch your breath by resting or doing a relaxed purse lip breathing pattern.  - The less you use it, the better it will work when you need it. - Ok to use the inhaler up to 2 puffs  every 4 hours if you must but call for appointment if use goes up over your usual need - Don't leave home without it !!  (think of it like the spare tire for your car)   If this is working call for alternative to symbicort   Please schedule a follow up office visit in 6 weeks, call sooner if needed - bring inhalers

## 2021-03-01 NOTE — Progress Notes (Addendum)
Subjective:     Patient ID: Audrey Russell, female   DOB: February 22, 1955     MRN: [id number]  HPI  23 yobf from [place]  quit smoking 2019 moved to [place] 2016 with cough/doe x fall   2017referred to pulmonary clinic 06/26/2017 by Dr   Audrey Russell with wt loss, night sweats and cavitary lesion in RUL pos segment.    06/26/2017 1st [facility] office visit/ Audrey Russell   Chief Complaint  Patient presents with   Pulmonary Consult    Referred by Dr. Wilfred Russell for eval of abnormal ct chest done 06/12/17.  She states she has been having SOB for the past 1-2 yrs. She states she "coughs constantly"- started back in Sept 2018 but has been getting progressively worse. Her cough is occ prod with clear to yellow sputum.    initially started with cough fall 2017 thick yellow / green phlegm no better with abx and never bloody worse in early am and up to a small cup in vol in am reported   Doe= MMRC3 = can't walk 100 yards even at a slow pace at a flat grade s stopping due to sob   Rec Pantoprazole (protonix) 40 mg   Take  30-60 min before first meal of the day and Pepcid (famotidine)  20 mg one after supper until return to office GERD diet reviewed, bed blocks  Only use your albuterol as a rescue medication   Please schedule a follow up office visit in 4 weeks, sooner if needed  with all medications /inhalers/ solutions in hand   03/01/2021  [name]    maint on saba no ppi  Chief Complaint  Patient presents with   Acute Visit    Increased DOE and cough since Oct 2022- non prod.    Dyspnea:  walks dog daily / [employer] parking ; Cough: first thing in am rattling / min mucoid assoc with nasal congestion/ prior f/b Audrey Russell Sleeping: L side down,  bed is flat 4 x  SABA use: 4 x daily with baseline hfa < 50%  02: none  Covid status:   vax x 2    No obvious day to day or daytime variability or assoc excess/ purulent sputum or mucus plugs or hemoptysis or cp or chest  tightness, subjective wheeze or overt sinus or hb symptoms.   Sleeping  without nocturnal   exacerbation  of respiratory  c/o's or need for noct saba. Also denies any obvious fluctuation of symptoms with weather or environmental changes or other aggravating or alleviating factors except as outlined above   No unusual exposure hx or h/o childhood pna/ asthma or knowledge of premature birth.  Current Allergies, Complete Past Medical History, Past Surgical History, Family History, and Social History were reviewed in [employer] record.  ROS  The following are not active complaints unless bolded Hoarseness, sore throat, dysphagia, dental problems, itching, sneezing,  nasal congestion or discharge of excess mucus or purulent secretions, ear ache,   fever, chills, sweats, unintended wt loss or wt gain, classically pleuritic or exertional cp,  orthopnea pnd or arm/hand swelling  or leg swelling, presyncope, palpitations, abdominal pain, anorexia, nausea, vomiting, diarrhea  or change in bowel habits or change in bladder habits, change in stools or change in urine, dysuria, hematuria,  rash, arthralgias, visual complaints, headache, numbness, weakness or ataxia or problems with walking or coordination,  change in mood or  memory.  Current Meds  Medication Sig   acetaminophen (TYLENOL) 500 MG tablet Take 1,000 mg by mouth every 6 (six) hours as needed for mild pain or moderate pain.   albuterol (VENTOLIN HFA) 108 (90 Base) MCG/ACT inhaler INHALE 1-2 PUFFS INTO THE LUNGS EVERY 6 (SIX) HOURS AS NEEDED FOR WHEEZING OR SHORTNESS OF BREATH.   aspirin EC 81 MG tablet Take 1 tablet (81 mg total) by mouth daily.   blood glucose meter kit and supplies One Touch Contour Monitor. Check glucose in AM, before breakfast. E11.65   cetirizine (ZYRTEC) 10 MG tablet Take 1 tablet (10 mg total) by mouth at bedtime.   citalopram (CELEXA) 40 MG tablet Take 1 tablet (40 mg total) by mouth daily.    ezetimibe (ZETIA) 10 MG tablet TAKE 1 TABLET BY MOUTH EVERY DAY IN THE EVENING   famotidine (PEPCID) 20 MG tablet TAKE 1 TABLET BY MOUTH EVERY DAY   fenofibrate 160 MG tablet TAKE 1 TABLET BY MOUTH EVERY DAY   fluticasone (FLONASE) 50 MCG/ACT nasal spray SPRAY 2 SPRAYS INTO EACH NOSTRIL EVERY DAY   glipiZIDE (GLUCOTROL XL) 5 MG 24 hr tablet TAKE 1 TABLET (5 MG TOTAL) BY MOUTH DAILY AFTER SUPPER.   glucose blood (CONTOUR NEXT TEST) test strip 1 each by Other route as needed for other. Use as instructed   glucose blood (CONTOUR NEXT TEST) test strip Check glucose before breakfast. E11.65   metFORMIN (GLUCOPHAGE) 850 MG tablet Take 1 tablet (850 mg total) by mouth 2 (two) times daily with a meal.   mirtazapine (REMERON) 30 MG tablet TAKE 1 TABLET BY MOUTH AT BEDTIME.   ONETOUCH ULTRA test strip USE TO CHECK BLOOD GLUCOSE IN AM BEFORE BREAKFAST   rosuvastatin (CRESTOR) 10 MG tablet TAKE 1 TABLET BY MOUTH EVERY DAY. Please make overdue appt with Dr. Harrington Russell for future refills [phone]. Thank you . 2nd attempt   Semaglutide (RYBELSUS) 3 MG TABS Take 1 tablet by mouth daily.   sodium chloride (OCEAN) 0.65 % SOLN nasal spray Place 1 spray into both nostrils as needed for congestion.             Objective:   Physical Exam   wts  03/01/2021         155   06/26/17 164 lb (74.4 kg)  05/07/17 165 lb (74.8 kg)  05/06/17 164 lb 6.4 oz (74.6 kg)    Vital signs reviewed  03/01/2021  - Note at rest 02 sats  96% on RA   General appearance:    amb bf nad/ mild pseudowheeze     HEENT : pt wearing mask not removed for exam due to covid - 19 concerns.    NECK :  without JVD/Nodes/TM/ nl carotid upstrokes bilaterally   LUNGS: no acc muscle use,  Mild barrel  contour chest wall with bilateral insp/exp rhonchi and decreased bs in R base     CV:  RRR  no s3 or murmur or increase in P2, and no edema   ABD:  soft and nontender with pos end  insp Hoover's  in the supine position. No bruits or  organomegaly appreciated, bowel sounds nl  MS:   Nl gait/  ext warm without deformities, calf tenderness, cyanosis or clubbing No obvious joint restrictions   SKIN: warm and dry without lesions    NEURO:  alert, approp, nl sensorium with  no motor or cerebellar deficits apparent.             I personally  reviewed images and agree with radiology impression as follows:   Chest CT 02/23/21 w contrast 1. Stable postoperative chest without evidence of local recurrence or metastatic disease. Previous right upper lobectomy. 2. Coronary and Aortic Atherosclerosis (ICD10-I70.0). Emphysema (ICD10-J43.9).     Assessment:

## 2021-03-02 ENCOUNTER — Other Ambulatory Visit: Payer: Self-pay | Admitting: Internal Medicine

## 2021-03-02 ENCOUNTER — Encounter: Payer: Self-pay | Admitting: Internal Medicine

## 2021-03-02 LAB — CBC WITH DIFFERENTIAL/PLATELET
Basophils Absolute: 0.2 10*3/uL — ABNORMAL HIGH (ref 0.0–0.1)
Basophils Relative: 1.6 % (ref 0.0–3.0)
Eosinophils Absolute: 0.1 10*3/uL (ref 0.0–0.7)
Eosinophils Relative: 1.2 % (ref 0.0–5.0)
HCT: 46.7 % — ABNORMAL HIGH (ref 36.0–46.0)
Hemoglobin: 15.4 g/dL — ABNORMAL HIGH (ref 12.0–15.0)
Lymphocytes Relative: 31.4 % (ref 12.0–46.0)
Lymphs Abs: 3.5 10*3/uL (ref 0.7–4.0)
MCHC: 32.9 g/dL (ref 30.0–36.0)
MCV: 84.7 fl (ref 78.0–100.0)
Monocytes Absolute: 0.7 10*3/uL (ref 0.1–1.0)
Monocytes Relative: 6.4 % (ref 3.0–12.0)
Neutro Abs: 6.7 10*3/uL (ref 1.4–7.7)
Neutrophils Relative %: 59.4 % (ref 43.0–77.0)
Platelets: 286 10*3/uL (ref 150.0–400.0)
RBC: 5.51 Mil/uL — ABNORMAL HIGH (ref 3.87–5.11)
RDW: 14.1 % (ref 11.5–15.5)
WBC: 11.3 10*3/uL — ABNORMAL HIGH (ref 4.0–10.5)

## 2021-03-02 LAB — IGE: IgE (Immunoglobulin E), Serum: 10 kU/L (ref <=114)

## 2021-03-02 NOTE — Assessment & Plan Note (Signed)
Quit smoking 2019  06/26/2017 Quant GOLD TB > neg 06/27/2017 sputum for afb > neg smear PET 07/17/2017 Low-grade but abnormal activity associated with the solid portion of the cavitary lesion in the right upper lobe, maximum SUV 3.3, suspicious for malignancy. -  pfts 07/22/17 >  Nl x dlco 37 corrects to 50 > 07/23/2017 refer to T surgery > thoracoscopic right upper lobectomy and node dissection.  Final pathology showed a T1, N0, stage IA adenocarcinoma> no adjuvant rx   Ct reviewed from 02/23/21 no evidence of recurrence/ f/u [facility]

## 2021-03-02 NOTE — Assessment & Plan Note (Addendum)
Stopped smoking 2019  - -  pfts 07/22/17 >  Nl x dlco 37 corrects to 50 - 03/01/2021  After extensive coaching inhaler device,  effectiveness =    75% continue symbicort 80 2bid and added max rx for gerd   DDX of  difficult airways management almost all start with A and  include Adherence, Ace Inhibitors, Acid Reflux, Active Sinus Disease, Alpha 1 Antitripsin deficiency, Anxiety masquerading as Airways dz,  ABPA,  Allergy(esp in young), Aspiration (esp in elderly), Adverse effects of meds,  Active smoking or vaping, A bunch of PE's (a small clot burden can't cause this syndrome unless there is already severe underlying pulm or vascular dz with poor reserve) plus two Bs  = Bronchiectasis and Beta blocker use..and one C= CHF   Adherence is always the initial "prime suspect" and is a multilayered concern that requires a "trust but verify" approach in every patient - starting with knowing how to use medications, especially inhalers, correctly, keeping up with refills and understanding the fundamental difference between maintenance and prns vs those medications only taken for a very short course and then stopped and not refilled.  - see hfa teaching - return with all meds in hand using a trust but verify approach to confirm accurate Medication  Reconciliation The principal here is that until we are certain that the  patients are doing what we've asked, it makes no sense to ask them to do more.   ? Acid (or non-acid) GERD > always difficult to exclude as up to 75% of pts in some series report no assoc GI/ Heartburn symptoms> rec max (24h)  acid suppression and diet restrictions/ reviewed and instructions given in writing.   ? Allergy > Prednisone 10 mg take  4 each am x 2 days,   2 each am x 2 days,  1 each am x 2 days and stop - leave symbicort @ 80 2bid based on concern this is more of an upper airway problem with minimal asthma and certainly not copd by previous pft at the time she quit smoking.   ? Active  sinus dz > augmentin x 10 days           Each maintenance medication was reviewed in detail including emphasizing most importantly the difference between maintenance and prns and under what circumstances the prns are to be triggered using an action plan format where appropriate.  Total time for H and P, chart review, counseling, reviewing hfa device(s) and generating customized AVS unique to this office visit / same day charting = 35 min for pt not seen in over 3 y

## 2021-03-07 ENCOUNTER — Other Ambulatory Visit: Payer: Self-pay | Admitting: Internal Medicine

## 2021-03-07 DIAGNOSIS — J449 Chronic obstructive pulmonary disease, unspecified: Secondary | ICD-10-CM

## 2021-03-07 NOTE — Telephone Encounter (Signed)
This is a [facility] pt.  °

## 2021-03-13 ENCOUNTER — Telehealth: Payer: Self-pay | Admitting: Internal Medicine

## 2021-03-13 MED ORDER — FLUCONAZOLE 100 MG PO TABS: 100.0000 mg | ORAL_TABLET | Freq: Every day | ORAL | 0 refills | Status: AC

## 2021-03-13 NOTE — Telephone Encounter (Signed)
Diflucan 100 mg daily x3 days

## 2021-03-13 NOTE — Telephone Encounter (Signed)
Called and spoke with patient. She verbalized understanding. RX has been sent to pharmacy.   Nothing further needed at time of call.

## 2021-03-13 NOTE — Telephone Encounter (Signed)
Called and spoke with patient. She stated that she was prescribed amoxicillin by MW on 03/01/21. She is now finished with the abx but she has developed a yeast infection as result. She would like to have something sent into the pharmacy for her.   Pharmacy is [facility] on [address]   MW, can you please advise? Thanks!

## 2021-03-15 ENCOUNTER — Telehealth: Payer: Self-pay | Admitting: *Deleted

## 2021-03-15 NOTE — Telephone Encounter (Signed)
Called pt multiple times, no answer. Left message for pt to call back by 5pm or procedure and PV would be cancelled. No return call received, procedure and PV cancelled. No show letter sent.

## 2021-03-16 ENCOUNTER — Encounter: Payer: Self-pay | Admitting: Nurse Practitioner

## 2021-03-16 ENCOUNTER — Other Ambulatory Visit: Payer: Self-pay

## 2021-03-16 ENCOUNTER — Ambulatory Visit (INDEPENDENT_AMBULATORY_CARE_PROVIDER_SITE_OTHER): Payer: Medicare HMO | Admitting: Nurse Practitioner

## 2021-03-16 VITALS — BP 124/72 | HR 90 | Temp 96.2°F | Wt 157.0 lb

## 2021-03-16 DIAGNOSIS — M5442 Lumbago with sciatica, left side: Secondary | ICD-10-CM

## 2021-03-16 MED ORDER — TIZANIDINE HCL 4 MG PO TABS: 4.0000 mg | ORAL_TABLET | Freq: Four times a day (QID) | ORAL | 0 refills | Status: DC | PRN

## 2021-03-16 MED ORDER — KETOROLAC TROMETHAMINE 60 MG/2ML IM SOLN
30.0000 mg | Freq: Once | INTRAMUSCULAR | Status: AC
  Administered 2021-03-16: 30 mg via INTRAMUSCULAR

## 2021-03-16 MED ORDER — LIDOCAINE 5 % EX PTCH: 1.0000 | MEDICATED_PATCH | CUTANEOUS | 0 refills | Status: DC
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
## 2021-03-16 NOTE — Assessment & Plan Note (Signed)
Acute x1 week. She was on prednisone 2 weeks ago for bronchitis. No red flags on exam. Will treat with toradol 30mg  x1 in office. Tizanidine as needed for muscle spasms. Stretches printed that she can do daily. Will consider imaging if not improving. Lumbar x-ray reviewed from 2017 which showed spondylosis. Follow up if symptoms worsen or don't improve.

## 2021-03-16 NOTE — Patient Instructions (Signed)
It was great to see you!  We are giving you toradol injection today for your pain. You can continue tylenol as needed. Start lidocaine patch on your lower back in the morning and take it off at night. They can be cold when you first put them on. I have also sent in tizanidine as needed for muscle spasms. Don't drive after taking this.   Let's follow-up if your symptoms don't improve or worsen.   If a referral was placed today, you will be contacted for an appointment. Please note that routine referrals can sometimes take up to 3-4 weeks to process. Please call our office if you haven't heard anything after this time frame.  Take care,  Vance Peper, NP

## 2021-03-17 ENCOUNTER — Other Ambulatory Visit: Payer: Self-pay | Admitting: Nurse Practitioner

## 2021-03-17 DIAGNOSIS — F325 Major depressive disorder, single episode, in full remission: Secondary | ICD-10-CM

## 2021-03-21 ENCOUNTER — Telehealth: Payer: Self-pay

## 2021-03-21 NOTE — Telephone Encounter (Signed)
PA denied for Lidocaine patch. Patient notified via telephone at 1341 on 03/21/2021.   ? ? ?

## 2021-03-29 ENCOUNTER — Encounter: Payer: Medicare HMO | Admitting: Internal Medicine

## 2021-04-11 ENCOUNTER — Other Ambulatory Visit: Payer: Self-pay | Admitting: Internal Medicine

## 2021-04-11 ENCOUNTER — Other Ambulatory Visit: Payer: Self-pay | Admitting: Nurse Practitioner

## 2021-04-11 DIAGNOSIS — E1165 Type 2 diabetes mellitus with hyperglycemia: Secondary | ICD-10-CM

## 2021-04-16 ENCOUNTER — Ambulatory Visit: Payer: Medicare HMO | Admitting: Internal Medicine

## 2021-04-16 NOTE — Progress Notes (Deleted)
?Subjective:  ?  ? Patient ID: Audrey Russell, female   DOB: Jul 02, 1955     MRN: [id number] ? ?HPI ? ?14 yobf from [place]  quit smoking 2019 moved to [place] 2016 with cough/doe x fall   2017referred to pulmonary clinic 06/26/2017 by Dr   Lorayne Marek with wt loss, night sweats and cavitary lesion in RUL pos segment. ? ? ? ?06/26/2017 1st [facility] office visit/ Audrey Russell   ?Chief Complaint  ?Patient presents with  ? Pulmonary Consult  ?  Referred by Dr. Wilfred Lacy for eval of abnormal ct chest done 06/12/17.  She states she has been having SOB for the past 1-2 yrs. She states she "coughs constantly"- started back in Sept 2018 but has been getting progressively worse. Her cough is occ prod with clear to yellow sputum.    ?initially started with cough fall 2017 thick yellow / green phlegm no better with abx and never bloody worse in early am and up to a small cup in vol in am reported   ?Doe= MMRC3 = can't walk 100 yards even at a slow pace at a flat grade s stopping due to sob   ?Rec ?Pantoprazole (protonix) 40 mg   Take  30-60 min before first meal of the day and Pepcid (famotidine)  20 mg one after supper until return to office ?GERD diet reviewed, bed blocks  ?Only use your albuterol as a rescue medication   ?Please schedule a follow up office visit in 4 weeks, sooner if needed  with all medications /inhalers/ solutions in hand ? ? ?03/01/2021  [name]    maint on saba no ppi  ?Chief Complaint  ?Patient presents with  ? Acute Visit  ?  Increased DOE and cough since Oct 2022- non prod.   ? Dyspnea:  walks dog daily / [employer] parking ; ?Cough: first thing in am rattling / min mucoid assoc with nasal congestion/ prior f/b [place] ?Sleeping: L side down,  bed is flat 4 x  ?SABA use: 4 x daily with baseline hfa < 50%  ?02: none  ?Covid status:   vax x 2  ?Rec ?Pantoprazole (protonix) 40 mg   Take  30-60 min before first meal of the day and Pepcid (famotidine)  20 mg after supper  until return to office  ?GERD diet reviewed, bed blocks rec  ?Prednisone 10 mg take  4 each am x 2 days,   2 each am x 2 days,  1 each am x 2 days and stop  ?Augmentin 875 mg take one pill twice daily  X 10 days ?Plan A = Automatic = Always=    Symbicort 80 Take 2 puffs first thing in am and then another 2 puffs about 12 hours later.  ? Work on inhaler technique:    ?Plan B = Backup (to supplement plan A, not to replace it) ?Only use your albuterol inhaler as a rescue medication  ?If this is working call for alternative to symbicort  ?Please schedule a follow up office visit in 6 weeks, call sooner if needed - bring inhalers  ? ? ?04/16/2021  f/u ov/Audrey Russell re: ***   maint on ***  ?No chief complaint on file. ?  ?Dyspnea:  *** ?Cough: *** ?Sleeping: *** ?SABA use: *** ?02: *** ?Covid status:   *** ? ? ?No obvious day to day or daytime variability or assoc excess/ purulent sputum or mucus plugs or hemoptysis or cp or chest  tightness, subjective wheeze or overt sinus or hb symptoms.  ? ?*** without nocturnal  or early am exacerbation  of respiratory  c/o's or need for noct saba. Also denies any obvious fluctuation of symptoms with weather or environmental changes or other aggravating or alleviating factors except as outlined above  ? ?No unusual exposure hx or h/o childhood pna/ asthma or knowledge of premature birth. ? ?Current Allergies, Complete Past Medical History, Past Surgical History, Family History, and Social History were reviewed in [employer] record. ? ?ROS  The following are not active complaints unless bolded ?Hoarseness, sore throat, dysphagia, dental problems, itching, sneezing,  nasal congestion or discharge of excess mucus or purulent secretions, ear ache,   fever, chills, sweats, unintended wt loss or wt gain, classically pleuritic or exertional cp,  orthopnea pnd or arm/hand swelling  or leg swelling, presyncope, palpitations, abdominal pain, anorexia, nausea, vomiting, diarrhea   or change in bowel habits or change in bladder habits, change in stools or change in urine, dysuria, hematuria,  rash, arthralgias, visual complaints, headache, numbness, weakness or ataxia or problems with walking or coordination,  change in mood or  memory. ?      ? ?No outpatient medications have been marked as taking for the 04/16/21 encounter (Appointment) with Tanda Rockers, MD.  ?    ?  ? ?  ? ?   ?Objective:  ? Physical Exam ?  ?Wts ? ?04/16/2021        *** ?03/01/2021         155   ?06/26/17 164 lb (74.4 kg)  ?05/07/17 165 lb (74.8 kg)  ?05/06/17 164 lb 6.4 oz (74.6 kg)  ?  ?Vital signs reviewed  04/16/2021  - Note at rest 02 sats  ***% on ***  ? ?General appearance:    *** ? ? ? ?   Mild bar *** ? ?   ? ?   ?Assessment:  ?   ?  ?   ? ?  ?

## 2021-04-18 ENCOUNTER — Encounter: Payer: Self-pay | Admitting: Nurse Practitioner

## 2021-04-18 ENCOUNTER — Ambulatory Visit (INDEPENDENT_AMBULATORY_CARE_PROVIDER_SITE_OTHER): Payer: Medicare HMO | Admitting: Nurse Practitioner

## 2021-04-18 VITALS — BP 112/62 | HR 60 | Temp 96.4°F | Ht 67.0 in | Wt 150.2 lb

## 2021-04-18 DIAGNOSIS — Z780 Asymptomatic menopausal state: Secondary | ICD-10-CM

## 2021-04-18 DIAGNOSIS — F325 Major depressive disorder, single episode, in full remission: Secondary | ICD-10-CM

## 2021-04-18 DIAGNOSIS — R61 Generalized hyperhidrosis: Secondary | ICD-10-CM | POA: Diagnosis not present

## 2021-04-18 DIAGNOSIS — E1169 Type 2 diabetes mellitus with other specified complication: Secondary | ICD-10-CM

## 2021-04-18 DIAGNOSIS — Z1231 Encounter for screening mammogram for malignant neoplasm of breast: Secondary | ICD-10-CM

## 2021-04-18 DIAGNOSIS — D126 Benign neoplasm of colon, unspecified: Secondary | ICD-10-CM

## 2021-04-18 DIAGNOSIS — I700 Atherosclerosis of aorta: Secondary | ICD-10-CM

## 2021-04-18 DIAGNOSIS — H6993 Unspecified Eustachian tube disorder, bilateral: Secondary | ICD-10-CM

## 2021-04-18 DIAGNOSIS — R69 Illness, unspecified: Secondary | ICD-10-CM | POA: Diagnosis not present

## 2021-04-18 DIAGNOSIS — E785 Hyperlipidemia, unspecified: Secondary | ICD-10-CM | POA: Diagnosis not present

## 2021-04-18 MED ORDER — FLUTICASONE PROPIONATE 50 MCG/ACT NA SUSP: NASAL | 5 refills | Status: DC

## 2021-04-18 NOTE — Assessment & Plan Note (Signed)
Reminded to schedule appt with advanced lipid clinic ?Repeat lipid panel ?

## 2021-04-18 NOTE — Assessment & Plan Note (Signed)
>>  ASSESSMENT AND PLAN FOR DEPRESSION, MAJOR, SINGLE EPISODE, COMPLETE REMISSION (HCC) WRITTEN ON 04/18/2021  2:48 PM BY Meeghan Skipper LUM, NP  Stable mood without medication nor counseling

## 2021-04-18 NOTE — Patient Instructions (Addendum)
Schedule appt with cardiology (Dr. Harrington Challenger) ?Schedule appt with GI for repeat colonoscopy ?Schedule appt for annual mammogram ?You will be contacted to schedule appt for CT ABD/pelvis ? ?Go to lab for blood draw and urine collection. ? ?

## 2021-04-18 NOTE — Assessment & Plan Note (Signed)
Reminded to schedule appt with GI for repeat colonoscopy ?

## 2021-04-18 NOTE — Assessment & Plan Note (Addendum)
BP at goal ?Controlled Dm ?LDL not at goal with crestor, zetia, crestor and fenofibrate ?Reminded to schedule appt with cardiology. ?

## 2021-04-18 NOTE — Assessment & Plan Note (Addendum)
Chronic, worse in last 47month. Wakes up with clothes and sheets wet. ?Has also noted decreased appetite and weight loss. This could be due to rybelsus for Dm control. This was added 827monthago. ?Wt Readings from Last 3 Encounters:  ?04/18/21 150 lb 3.2 oz (68.1 kg)  ?03/16/21 157 lb (71.2 kg)  ?03/01/21 155 lb 9.6 oz (70.6 kg)  ?No nausea, no change in BM, no chest/ABD/pelvic pain. ?Has chronic but stable SOB with dyspnea. Hx of emphysema. ?Hx of lung cancer with s/p lobectomy, last CT chest 20/2023: no lesions ot lymphaedenopathy. ?Hx of tubular adenoma colon polyps, last colonoscopy 2019. Repeat in 3y18yr? ?Schedule repeat mammogram, colonoscopy, and CT ABD/pelvis. ?Reviewed CBC (mild leukocytosis) and CMP (normal)  completed 02/2021. ?Check CBC, HIV, ESR, CRP, urinalysis, TB-Gold ?

## 2021-04-18 NOTE — Progress Notes (Signed)
? ?             Established Patient Visit ? ?Patient: Audrey Russell   DOB: 08/24/1955   66 y.o. Female  MRN: [id number] ?Visit Date: 04/18/2021 ? ?Subjective:  ?  ?Chief Complaint  ?Patient presents with  ? Follow-up  ?  F/u on DM  ?Pt is fasting ?Annual eye exam scheduled for May 2023 ?Plans to schedule Mammogram and Colonoscopy  ? ?HPI ?Night sweat ?Chronic, worse in last 33months. Wakes up with clothes and sheets wet. ?Has also noted decreased appetite and weight loss. This could be due to rybelsus for Dm control. This was added 30months ago. ?Wt Readings from Last 3 Encounters:  ?04/18/21 150 lb 3.2 oz (68.1 kg)  ?03/16/21 157 lb (71.2 kg)  ?03/01/21 155 lb 9.6 oz (70.6 kg)  ?No nausea, no change in BM, no chest/ABD/pelvic pain. ?Has chronic but stable SOB with dyspnea. Hx of emphysema. ?Hx of lung cancer with s/p lobectomy, last CT chest 20/2023: no lesions ot lymphaedenopathy. ?Hx of tubular adenoma colon polyps, last colonoscopy 2019. Repeat in 58yrs. ? ?Schedule repeat mammogram, colonoscopy, and CT ABD/pelvis. ?Reviewed CBC (mild leukocytosis) and CMP (normal)  completed 02/2021. ?Check CBC, HIV, ESR, CRP, urinalysis, TB-Gold ? ?DM (diabetes mellitus) ([place]) ?Fasting glucose: 100-120 ?Loss of appetite and weight loss with rybelsus ?No adverse effects with metformin and glipizide ?Has upcoming appt for ophthalmology ?BP at goal ?LDL not at goal, reminded to schedule appt with advanced lipid clinic ? ?Collect urine microalbumin ?Repeat hgbA1c ?Maintain current med doses ? ?Tubular adenoma of colon ?Reminded to schedule appt with GI for repeat colonoscopy ? ?Hyperlipidemia associated with type 2 diabetes mellitus ([place]) ?Reminded to schedule appt with advanced lipid clinic ?Repeat lipid panel ? ?Depression, major, single episode, complete remission ([place]) ?Stable mood without medication nor counseling ? ?Atherosclerosis of aorta ([place]) ?BP at goal ?Controlled Dm ?LDL not at goal with crestor, zetia,  crestor and fenofibrate ?Reminded to schedule appt with cardiology. ? ?BP Readings from Last 3 Encounters:  ?04/18/21 112/62  ?03/16/21 124/72  ?03/01/21 108/62  ?   ?Reviewed medical, surgical, and social history today ? ?Medications: ?Outpatient Medications Prior to Visit  ?Medication Sig  ? acetaminophen (TYLENOL) 500 MG tablet Take 1,000 mg by mouth every 6 (six) hours as needed for mild pain or moderate pain.  ? albuterol (VENTOLIN HFA) 108 (90 Base) MCG/ACT inhaler INHALE 1-2 PUFFS INTO THE LUNGS EVERY 6 (SIX) HOURS AS NEEDED FOR WHEEZING OR SHORTNESS OF BREATH.  ? aspirin EC 81 MG tablet Take 1 tablet (81 mg total) by mouth daily.  ? blood glucose meter kit and supplies One Touch Contour Monitor. Check glucose in AM, before breakfast. E11.65  ? budesonide-formoterol (SYMBICORT) 80-4.5 MCG/ACT inhaler Take 2 puffs first thing in am and then another 2 puffs about 12 hours later.  ? cetirizine (ZYRTEC) 10 MG tablet Take 1 tablet (10 mg total) by mouth at bedtime.  ? citalopram (CELEXA) 40 MG tablet TAKE 1 TABLET BY MOUTH EVERY DAY  ? ezetimibe (ZETIA) 10 MG tablet TAKE 1 TABLET BY MOUTH EVERY DAY IN THE EVENING  ? famotidine (PEPCID) 20 MG tablet TAKE 1 TABLET BY MOUTH EVERY DAY  ? fenofibrate 160 MG tablet TAKE 1 TABLET BY MOUTH EVERY DAY  ? fluticasone (FLONASE) 50 MCG/ACT nasal spray SPRAY 2 SPRAYS INTO EACH NOSTRIL EVERY DAY  ? glipiZIDE (GLUCOTROL XL) 5 MG 24 hr tablet TAKE 1 TABLET (5 MG TOTAL) BY MOUTH DAILY AFTER SUPPER.  ?  glucose blood (CONTOUR NEXT TEST) test strip 1 each by Other route as needed for other. Use as instructed  ? glucose blood (CONTOUR NEXT TEST) test strip Check glucose before breakfast. E11.65  ? metFORMIN (GLUCOPHAGE) 850 MG tablet Take 1 tablet (850 mg total) by mouth 2 (two) times daily with a meal.  ? mirtazapine (REMERON) 30 MG tablet TAKE 1 TABLET BY MOUTH AT BEDTIME.  ? ONETOUCH ULTRA test strip USE TO CHECK BLOOD GLUCOSE IN AM BEFORE BREAKFAST  ? pantoprazole (PROTONIX) 40 MG  tablet TAKE 1 TABLET (40 MG TOTAL) BY MOUTH DAILY. TAKE 30-60 MIN BEFORE FIRST MEAL OF THE DAY  ? rosuvastatin (CRESTOR) 10 MG tablet TAKE 1 TABLET BY MOUTH EVERY DAY. PLEASE MAKE OVERDUE APPT WITH DR. ROSS FOR FUTURE REFILLS [phone]. THANK YOU . 2ND ATTEMPT  ? Semaglutide (RYBELSUS) 3 MG TABS Take 1 tablet by mouth daily.  ? sodium chloride (OCEAN) 0.65 % SOLN nasal spray Place 1 spray into both nostrils as needed for congestion.  ? tiZANidine (ZANAFLEX) 4 MG tablet Take 1 tablet (4 mg total) by mouth every 6 (six) hours as needed for muscle spasms.  ? [DISCONTINUED] predniSONE (DELTASONE) 10 MG tablet Take  4 each am x 2 days,   2 each am x 2 days,  1 each am x 2 days and stop  ? [DISCONTINUED] lidocaine (LIDODERM) 5 % Place 1 patch onto the skin daily. Remove & Discard patch within 12 hours or as directed by MD (Patient not taking: Reported on 04/18/2021)  ? ?No facility-administered medications prior to visit.  ? ?Reviewed past medical and social history.  ? ?ROS per HPI above ? ? ?   ?Objective:  ?BP 112/62 (BP Location: Left Arm, Patient Position: Sitting, Cuff Size: Normal)   Pulse 60   Temp (!) 96.4 ?F (35.8 ?C) (Temporal)   Ht $R'5\' 7"'Zp$  (1.702 m)   Wt 150 lb 3.2 oz (68.1 kg)   SpO2 95%   BMI 23.52 kg/m?  ? ?  ? ?Physical Exam ?Vitals reviewed.  ?Constitutional:   ?   General: She is not in acute distress. ?Cardiovascular:  ?   Rate and Rhythm: Normal rate and regular rhythm.  ?   Pulses: Normal pulses.  ?   Heart sounds: Normal heart sounds.  ?Pulmonary:  ?   Effort: Pulmonary effort is normal.  ?   Breath sounds: Normal breath sounds.  ?Abdominal:  ?   General: Bowel sounds are normal.  ?   Palpations: Abdomen is soft.  ?Musculoskeletal:  ?   Right lower leg: No edema.  ?   Left lower leg: No edema.  ?Neurological:  ?   Mental Status: She is alert and oriented to person, place, and time.  ?  ?No results found for any visits on 04/18/21. ?   ?Assessment & Plan:  ?  ?Problem List Items Addressed This  Visit   ? ?  ? Cardiovascular and Mediastinum  ? Atherosclerosis of aorta ([place])  ?  BP at goal ?Controlled Dm ?LDL not at goal with crestor, zetia, crestor and fenofibrate ?Reminded to schedule appt with cardiology. ?  ?  ?  ? Digestive  ? Tubular adenoma of colon  ?  Reminded to schedule appt with GI for repeat colonoscopy ?  ?  ?  ? Endocrine  ? DM (diabetes mellitus) ([place]) - Primary  ?  Fasting glucose: 100-120 ?Loss of appetite and weight loss with rybelsus ?No adverse effects with metformin and glipizide ?  Has upcoming appt for ophthalmology ?BP at goal ?LDL not at goal, reminded to schedule appt with advanced lipid clinic ? ?Collect urine microalbumin ?Repeat hgbA1c ?Maintain current med doses ?  ?  ? Relevant Orders  ? Microalbumin / creatinine urine ratio  ? Hemoglobin A1c  ? Lipid panel  ? Hyperlipidemia associated with type 2 diabetes mellitus ([place])  ?  Reminded to schedule appt with advanced lipid clinic ?Repeat lipid panel ?  ?  ? Relevant Orders  ? Lipid panel  ?  ? Other  ? Depression, major, single episode, complete remission ([place])  ?  Stable mood without medication nor counseling ?  ?  ? Night sweat  ?  Chronic, worse in last 42months. Wakes up with clothes and sheets wet. ?Has also noted decreased appetite and weight loss. This could be due to rybelsus for Dm control. This was added 36months ago. ?Wt Readings from Last 3 Encounters:  ?04/18/21 150 lb 3.2 oz (68.1 kg)  ?03/16/21 157 lb (71.2 kg)  ?03/01/21 155 lb 9.6 oz (70.6 kg)  ?No nausea, no change in BM, no chest/ABD/pelvic pain. ?Has chronic but stable SOB with dyspnea. Hx of emphysema. ?Hx of lung cancer with s/p lobectomy, last CT chest 20/2023: no lesions ot lymphaedenopathy. ?Hx of tubular adenoma colon polyps, last colonoscopy 2019. Repeat in 17yrs. ? ?Schedule repeat mammogram, colonoscopy, and CT ABD/pelvis. ?Reviewed CBC (mild leukocytosis) and CMP (normal)  completed 02/2021. ?Check CBC, HIV, ESR, CRP, urinalysis, TB-Gold ?  ?  ? Relevant  Orders  ? Urinalysis w microscopic + reflex cultur  ? C-reactive protein  ? Culture, blood (single) w Reflex to ID Panel  ? HIV antibody (with reflex)  ? CBC with Differential/Platelet  ? QuantiFERON-TB Gold Plu

## 2021-04-18 NOTE — Assessment & Plan Note (Signed)
Stable mood without medication nor counseling ?

## 2021-04-18 NOTE — Assessment & Plan Note (Addendum)
Fasting glucose: 100-120 ?Loss of appetite and weight loss with rybelsus ?No adverse effects with metformin and glipizide ?Has upcoming appt for ophthalmology ?BP at goal ?LDL not at goal, reminded to schedule appt with advanced lipid clinic ? ?Collect urine microalbumin ?Repeat hgbA1c ?Maintain current med doses ?

## 2021-04-18 NOTE — Addendum Note (Signed)
Addended by: Leana Gamer on: 04/18/2021 04:02 PM ? ? Modules accepted: Orders ? ?

## 2021-04-19 ENCOUNTER — Telehealth: Payer: Self-pay | Admitting: *Deleted

## 2021-04-19 LAB — MICROALBUMIN / CREATININE URINE RATIO
Creatinine,U: 69.8 mg/dL
Microalb Creat Ratio: 9.5 mg/g (ref 0.0–30.0)
Microalb, Ur: 6.6 mg/dL — ABNORMAL HIGH (ref 0.0–1.9)

## 2021-04-19 NOTE — Chronic Care Management (AMB) (Signed)
?  Care Management  ? ?Note ? ?04/19/2021 ?Name: [name] MRN: [id number] DOB: [birth year] ? ?Audrey Russell is a 66 y.o. year old female who is a primary care patient of [name], [name], NP. I reached out to Deri Fuelling by phone today offer care coordination services.  ? ?Audrey Russell was given information about care management services today including:  ?Care management services include personalized support from designated clinical staff supervised by her physician, including individualized plan of care and coordination with other care providers ?24/7 contact phone numbers for assistance for urgent and routine care needs. ?The patient may stop care management services at any time by phone call to the office staff. ? ?Patient agreed to services and verbal consent obtained.  ? ?Follow up plan: ?Telephone appointment with care management team member scheduled for: 05/17/2021 ? ?Antony Sian, CCMA ?Care Guide, Embedded Care Coordination ?[facility]  Care Management  ?Direct Dial: [phone] ? ? ?

## 2021-04-24 ENCOUNTER — Ambulatory Visit (INDEPENDENT_AMBULATORY_CARE_PROVIDER_SITE_OTHER): Payer: Medicare HMO

## 2021-04-24 ENCOUNTER — Encounter: Payer: Self-pay | Admitting: Internal Medicine

## 2021-04-24 DIAGNOSIS — Z0000 Encounter for general adult medical examination without abnormal findings: Secondary | ICD-10-CM | POA: Diagnosis not present

## 2021-04-24 NOTE — Patient Instructions (Signed)
Audrey Russell , ?Thank you for taking time to come for your Medicare Wellness Visit. I appreciate your ongoing commitment to your health goals. Please review the following plan we discussed and let me know if I can assist you in the future.  ? ?Screening recommendations/referrals: ?Colonoscopy: patient will schedule  ?Mammogram: scheduled 10/08/2021 ?Bone Density: scheduled 10/08/2021 ?Recommended yearly ophthalmology/optometry visit for glaucoma screening and checkup ?Recommended yearly dental visit for hygiene and checkup ? ?Vaccinations: ?Influenza vaccine: completed  ?Pneumococcal vaccine: completed  ?Tdap vaccine: 01/28/2017 ?Shingles vaccine: completed    ? ?Advanced directives: yes  ? ?Conditions/risks identified: none  ? ?Next appointment: none  ? ? ?Preventive Care 48 Years and Older, Female ?Preventive care refers to lifestyle choices and visits with your health care provider that can promote health and wellness. ?What does preventive care include? ?A yearly physical exam. This is also called an annual well check. ?Dental exams once or twice a year. ?Routine eye exams. Ask your health care provider how often you should have your eyes checked. ?Personal lifestyle choices, including: ?Daily care of your teeth and gums. ?Regular physical activity. ?Eating a healthy diet. ?Avoiding tobacco and drug use. ?Limiting alcohol use. ?Practicing safe sex. ?Taking low-dose aspirin every day. ?Taking vitamin and mineral supplements as recommended by your health care provider. ?What happens during an annual well check? ?The services and screenings done by your health care provider during your annual well check will depend on your age, overall health, lifestyle risk factors, and family history of disease. ?Counseling  ?Your health care provider may ask you questions about your: ?Alcohol use. ?Tobacco use. ?Drug use. ?Emotional well-being. ?Home and relationship well-being. ?Sexual activity. ?Eating habits. ?History of  falls. ?Memory and ability to understand (cognition). ?Work and work Statistician. ?Reproductive health. ?Screening  ?You may have the following tests or measurements: ?Height, weight, and BMI. ?Blood pressure. ?Lipid and cholesterol levels. These may be checked every 5 years, or more frequently if you are over 16 years old. ?Skin check. ?Lung cancer screening. You may have this screening every year starting at age 80 if you have a 30-pack-year history of smoking and currently smoke or have quit within the past 15 years. ?Fecal occult blood test (FOBT) of the stool. You may have this test every year starting at age 64. ?Flexible sigmoidoscopy or colonoscopy. You may have a sigmoidoscopy every 5 years or a colonoscopy every 10 years starting at age 42. ?Hepatitis C blood test. ?Hepatitis B blood test. ?Sexually transmitted disease (STD) testing. ?Diabetes screening. This is done by checking your blood sugar (glucose) after you have not eaten for a while (fasting). You may have this done every 1-3 years. ?Bone density scan. This is done to screen for osteoporosis. You may have this done starting at age 34. ?Mammogram. This may be done every 1-2 years. Talk to your health care provider about how often you should have regular mammograms. ?Talk with your health care provider about your test results, treatment options, and if necessary, the need for more tests. ?Vaccines  ?Your health care provider may recommend certain vaccines, such as: ?Influenza vaccine. This is recommended every year. ?Tetanus, diphtheria, and acellular pertussis (Tdap, Td) vaccine. You may need a Td booster every 10 years. ?Zoster vaccine. You may need this after age 74. ?Pneumococcal 13-valent conjugate (PCV13) vaccine. One dose is recommended after age 32. ?Pneumococcal polysaccharide (PPSV23) vaccine. One dose is recommended after age 57. ?Talk to your health care provider about which screenings and vaccines you  need and how often you need  them. ?This information is not intended to replace advice given to you by your health care provider. Make sure you discuss any questions you have with your health care provider. ?Document Released: 02/03/2015 Document Revised: 09/27/2015 Document Reviewed: 11/08/2014 ?Elsevier Interactive Patient Education ? 2017 [place]. ? ?Fall Prevention in the Home ?Falls can cause injuries. They can happen to people of all ages. There are many things you can do to make your home safe and to help prevent falls. ?What can I do on the outside of my home? ?Regularly fix the edges of walkways and driveways and fix any cracks. ?Remove anything that might make you trip as you walk through a door, such as a raised step or threshold. ?Trim any bushes or trees on the path to your home. ?Use bright outdoor lighting. ?Clear any walking paths of anything that might make someone trip, such as rocks or tools. ?Regularly check to see if handrails are loose or broken. Make sure that both sides of any steps have handrails. ?Any raised decks and porches should have guardrails on the edges. ?Have any leaves, snow, or ice cleared regularly. ?Use sand or salt on walking paths during winter. ?Clean up any spills in your garage right away. This includes oil or grease spills. ?What can I do in the bathroom? ?Use night lights. ?Install grab bars by the toilet and in the tub and shower. Do not use towel bars as grab bars. ?Use non-skid mats or decals in the tub or shower. ?If you need to sit down in the shower, use a plastic, non-slip stool. ?Keep the floor dry. Clean up any water that spills on the floor as soon as it happens. ?Remove soap buildup in the tub or shower regularly. ?Attach bath mats securely with double-sided non-slip rug tape. ?Do not have throw rugs and other things on the floor that can make you trip. ?What can I do in the bedroom? ?Use night lights. ?Make sure that you have a light by your bed that is easy to reach. ?Do not use  any sheets or blankets that are too big for your bed. They should not hang down onto the floor. ?Have a firm chair that has side arms. You can use this for support while you get dressed. ?Do not have throw rugs and other things on the floor that can make you trip. ?What can I do in the kitchen? ?Clean up any spills right away. ?Avoid walking on wet floors. ?Keep items that you use a lot in easy-to-reach places. ?If you need to reach something above you, use a strong step stool that has a grab bar. ?Keep electrical cords out of the way. ?Do not use floor polish or wax that makes floors slippery. If you must use wax, use non-skid floor wax. ?Do not have throw rugs and other things on the floor that can make you trip. ?What can I do with my stairs? ?Do not leave any items on the stairs. ?Make sure that there are handrails on both sides of the stairs and use them. Fix handrails that are broken or loose. Make sure that handrails are as long as the stairways. ?Check any carpeting to make sure that it is firmly attached to the stairs. Fix any carpet that is loose or worn. ?Avoid having throw rugs at the top or bottom of the stairs. If you do have throw rugs, attach them to the floor with carpet tape. ?Make sure that  you have a light switch at the top of the stairs and the bottom of the stairs. If you do not have them, ask someone to add them for you. ?What else can I do to help prevent falls? ?Wear shoes that: ?Do not have high heels. ?Have rubber bottoms. ?Are comfortable and fit you well. ?Are closed at the toe. Do not wear sandals. ?If you use a stepladder: ?Make sure that it is fully opened. Do not climb a closed stepladder. ?Make sure that both sides of the stepladder are locked into place. ?Ask someone to hold it for you, if possible. ?Clearly mark and make sure that you can see: ?Any grab bars or handrails. ?First and last steps. ?Where the edge of each step is. ?Use tools that help you move around (mobility aids)  if they are needed. These include: ?Canes. ?Walkers. ?Scooters. ?Crutches. ?Turn on the lights when you go into a dark area. Replace any light bulbs as soon as they burn out. ?Set up your furniture so you have a
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
## 2021-04-26 ENCOUNTER — Telehealth: Payer: Self-pay | Admitting: Nurse Practitioner

## 2021-04-26 ENCOUNTER — Other Ambulatory Visit (INDEPENDENT_AMBULATORY_CARE_PROVIDER_SITE_OTHER): Payer: Medicare HMO

## 2021-04-26 DIAGNOSIS — E1169 Type 2 diabetes mellitus with other specified complication: Secondary | ICD-10-CM

## 2021-04-26 DIAGNOSIS — R61 Generalized hyperhidrosis: Secondary | ICD-10-CM | POA: Diagnosis not present

## 2021-04-26 DIAGNOSIS — E785 Hyperlipidemia, unspecified: Secondary | ICD-10-CM | POA: Diagnosis not present

## 2021-04-26 LAB — LIPID PANEL
Cholesterol: 151 mg/dL (ref 0–200)
HDL: 36.2 mg/dL — ABNORMAL LOW (ref >39.00)
LDL Cholesterol: 87 mg/dL (ref 0–99)
NonHDL: 114.62
Total CHOL/HDL Ratio: 4
Triglycerides: 136 mg/dL (ref 0.0–149.0)
VLDL: 27.2 mg/dL (ref 0.0–40.0)

## 2021-04-26 LAB — CBC WITH DIFFERENTIAL/PLATELET
Basophils Absolute: 0.1 10*3/uL (ref 0.0–0.1)
Basophils Relative: 1 % (ref 0.0–3.0)
Eosinophils Absolute: 0.1 10*3/uL (ref 0.0–0.7)
Eosinophils Relative: 1.4 % (ref 0.0–5.0)
HCT: 44.1 % (ref 36.0–46.0)
Hemoglobin: 14.6 g/dL (ref 12.0–15.0)
Lymphocytes Relative: 28.9 % (ref 12.0–46.0)
Lymphs Abs: 2.3 10*3/uL (ref 0.7–4.0)
MCHC: 33.1 g/dL (ref 30.0–36.0)
MCV: 85.2 fl (ref 78.0–100.0)
Monocytes Absolute: 0.5 10*3/uL (ref 0.1–1.0)
Monocytes Relative: 6 % (ref 3.0–12.0)
Neutro Abs: 4.9 10*3/uL (ref 1.4–7.7)
Neutrophils Relative %: 62.7 % (ref 43.0–77.0)
Platelets: 259 10*3/uL (ref 150.0–400.0)
RBC: 5.18 Mil/uL — ABNORMAL HIGH (ref 3.87–5.11)
RDW: 14.3 % (ref 11.5–15.5)
WBC: 7.8 10*3/uL (ref 4.0–10.5)

## 2021-04-26 LAB — HEMOGLOBIN A1C: Hgb A1c MFr Bld: 8.8 % — ABNORMAL HIGH (ref 4.6–6.5)

## 2021-04-26 LAB — C-REACTIVE PROTEIN: CRP: 1.6 mg/dL (ref 0.5–20.0)

## 2021-04-26 LAB — SEDIMENTATION RATE: Sed Rate: 35 mm/hr — ABNORMAL HIGH (ref 0–30)

## 2021-04-26 NOTE — Telephone Encounter (Signed)
Pt called requesting a CB from [facility], said it's really important. No other details.  ? ?CB [phone] ?

## 2021-05-02 LAB — CULTURE, BLOOD (SINGLE)
MICRO NUMBER:: 13231176
Result:: NO GROWTH
SPECIMEN QUALITY:: ADEQUATE

## 2021-05-02 LAB — QUANTIFERON-TB GOLD PLUS
Mitogen-NIL: >10 IU/mL
NIL: 0.06 IU/mL
QuantiFERON-TB Gold Plus: NEGATIVE
TB1-NIL: <0 IU/mL
TB2-NIL: <0 IU/mL

## 2021-05-02 LAB — HIV ANTIBODY (ROUTINE TESTING W REFLEX): HIV 1&2 Ab, 4th Generation: NONREACTIVE

## 2021-05-03 ENCOUNTER — Ambulatory Visit (AMBULATORY_SURGERY_CENTER): Payer: Medicare HMO | Admitting: *Deleted

## 2021-05-03 ENCOUNTER — Other Ambulatory Visit: Payer: Self-pay

## 2021-05-03 VITALS — Ht 67.0 in | Wt 150.0 lb

## 2021-05-03 DIAGNOSIS — Z86010 Personal history of colonic polyps: Secondary | ICD-10-CM

## 2021-05-03 MED ORDER — NA SULFATE-K SULFATE-MG SULF 17.5-3.13-1.6 GM/177ML PO SOLN: 1.0000 | Freq: Once | ORAL | 0 refills | Status: AC

## 2021-05-03 NOTE — Progress Notes (Signed)
No egg or soy allergy known to patient  ?No issues known to pt with past sedation with any surgeries or procedures ?Patient denies ever being told they had issues or difficulty with intubation  ?No FH of Malignant Hyperthermia ?Pt is not on diet pills ?Pt is not on  home 02  ?Pt is not on blood thinners  ?Pt denies issues with constipation  ?No A fib or A flutter ? ?suprep Coupon to pt in PV today , Code to Pharmacy and  NO PA's for preps discussed with pt In PV today  ?Discussed with pt there will be an out-of-pocket cost for prep and that varies from $0 to 70 +  dollars - pt verbalized understanding  ? ?Due to the COVID-19 pandemic we are asking patients to follow certain guidelines in PV and the [place]   ?Pt aware of COVID protocols and [facility] guidelines  ? ?PV completed over the phone. Pt verified name, DOB, address and insurance during PV today.  ?Pt mailed instruction packet with copy of consent form to read and not return, and instructions.  ?Pt encouraged to call with questions or issues.  ?If pt has My chart, procedure instructions sent via My Chart   ?

## 2021-05-11 ENCOUNTER — Telehealth: Payer: Self-pay

## 2021-05-11 NOTE — Progress Notes (Signed)
? ? ?Chronic Care Management ?Pharmacy Assistant  ? ?Name: Audrey Russell  MRN: [id number] DOB: 1955-03-04 ? ?Chart review for the clinical pharmacist on 05/17/2021 at 1:30 pm. ? ?Conditions to be addressed/monitored: ?HTN, HLD, DMII, Depression, Asthma, and Atherosclerosis of aorta, Multiple Sclerosis, Chronic Pain, Bipolar affective, Headache   ? ?Primary concerns for visit include: ?Overall Health  ? ?Recent office visits:  ?04/24/2021 Randel Pigg LPN (PCP Office) Medicare wellness Completed, No medication Changes noted ?04/18/2021 Wilfred Lacy NP (PCP) No medication changes noted, return in 3 months, AMB Referral to [facility] ?03/16/2021 Vance Peper NP (PCP Office) Start lidocaine (LIDODERM) 5 %, start Tizanidine 4 mg PRN, toradol injection given , return in 3-4 weeks ?11/24/2020 Wilfred Lacy NP (PCP) No Medication Changes noted, Follow up in 3 months ? ?Recent consult visits:  ?05/03/2021 Jayme Cloud RN (Gastroenterology) Start Na Sulfate-K Sulfate-Mg Sulf 17.5-3.13-1.6 GM/177 ML , Ambulatory referral to Gastroenterology ?03/13/2021 Dr. Melvyn Novas MD (Pulmonary) start Diflucan 100 mg daily x3 days  ?03/01/2021 Dr. Melvyn Novas MD (Pulmonary) start Augmentin 875-125 mg, start Symbicort 80-4.5 MCG/ACT, start Pantoprazole 40 mg daily, start Prednisone 10 mg . ?02/26/2021 Dr. Julien Nordmann MD (Oncology) No Medication Changes noted ? ?Hospital visits:  ?None in previous 6 months ? ?Questions for Clinical Pharmacist:  ? ?1.Are you able to connect with Patient yes ?  ?  ?2.Confirmed appointment date/time with patient/caregiver? Confirm appointment on 05/17/2021 at 1:30 pm with Daron Offer CPP ?  ? ?  ?3.Visit type telephone ?  ?  ?4.Patient/Caregiver instructed to bring medications to appointment. Patient is aware to bring all medications and supplements to appointment ? ?  ?5.What, if any, problems do you have getting your medications from the pharmacy? ? None, patients any issue getting her  medications. ?  ? ?  ?6.What is your top health concern to discuss at your upcoming visit? ? Patient denies any health concerns or issue at this time. ?  ?7.Have you seen any other providers since your last visit?   ?No, patient denies seeing any other provider since her last visit. ? ? ?Medications: ?Outpatient Encounter Medications as of 05/11/2021  ?Medication Sig  ? acetaminophen (TYLENOL) 500 MG tablet Take 1,000 mg by mouth every 6 (six) hours as needed for mild pain or moderate pain.  ? albuterol (VENTOLIN HFA) 108 (90 Base) MCG/ACT inhaler INHALE 1-2 PUFFS INTO THE LUNGS EVERY 6 (SIX) HOURS AS NEEDED FOR WHEEZING OR SHORTNESS OF BREATH.  ? aspirin EC 81 MG tablet Take 1 tablet (81 mg total) by mouth daily.  ? blood glucose meter kit and supplies One Touch Contour Monitor. Check glucose in AM, before breakfast. E11.65  ? budesonide-formoterol (SYMBICORT) 80-4.5 MCG/ACT inhaler Take 2 puffs first thing in am and then another 2 puffs about 12 hours later.  ? cetirizine (ZYRTEC) 10 MG tablet Take 1 tablet (10 mg total) by mouth at bedtime. (Patient not taking: Reported on 05/03/2021)  ? citalopram (CELEXA) 40 MG tablet TAKE 1 TABLET BY MOUTH EVERY DAY  ? ezetimibe (ZETIA) 10 MG tablet TAKE 1 TABLET BY MOUTH EVERY DAY IN THE EVENING  ? famotidine (PEPCID) 20 MG tablet TAKE 1 TABLET BY MOUTH EVERY DAY  ? fenofibrate 160 MG tablet TAKE 1 TABLET BY MOUTH EVERY DAY  ? fluticasone (FLONASE) 50 MCG/ACT nasal spray SPRAY 2 SPRAYS INTO EACH NOSTRIL EVERY DAY  ? glipiZIDE (GLUCOTROL XL) 5 MG 24 hr tablet TAKE 1 TABLET (5 MG TOTAL) BY MOUTH DAILY AFTER SUPPER.  ? glucose  blood (CONTOUR NEXT TEST) test strip 1 each by Other route as needed for other. Use as instructed  ? glucose blood (CONTOUR NEXT TEST) test strip Check glucose before breakfast. E11.65  ? metFORMIN (GLUCOPHAGE) 850 MG tablet Take 1 tablet (850 mg total) by mouth 2 (two) times daily with a meal.  ? mirtazapine (REMERON) 30 MG tablet TAKE 1 TABLET BY MOUTH AT  BEDTIME.  ? ONETOUCH ULTRA test strip USE TO CHECK BLOOD GLUCOSE IN AM BEFORE BREAKFAST  ? pantoprazole (PROTONIX) 40 MG tablet TAKE 1 TABLET (40 MG TOTAL) BY MOUTH DAILY. TAKE 30-60 MIN BEFORE FIRST MEAL OF THE DAY  ? rosuvastatin (CRESTOR) 10 MG tablet TAKE 1 TABLET BY MOUTH EVERY DAY. PLEASE MAKE OVERDUE APPT WITH DR. ROSS FOR FUTURE REFILLS [phone]. THANK YOU . 2ND ATTEMPT  ? Semaglutide (RYBELSUS) 3 MG TABS Take 1 tablet by mouth daily.  ? sodium chloride (OCEAN) 0.65 % SOLN nasal spray Place 1 spray into both nostrils as needed for congestion. (Patient not taking: Reported on 05/03/2021)  ? tiZANidine (ZANAFLEX) 4 MG tablet Take 1 tablet (4 mg total) by mouth every 6 (six) hours as needed for muscle spasms.  ? ?No facility-administered encounter medications on file as of 05/11/2021.  ? ? ?Care Gaps: ?COVID-19 Vaccine ?Colonoscopy  ?Ophthalmology Exam ?Shingrix Vaccine ? ?Star Rating Drugs: ?Glipizide 5 mg last filled 04/10/2021 [facility] ?Rosuvastatin 10 mg last filled 03/13/2021 for 7 day supply [facility]. ?Metformin 850 mg  last filled 04/10/2021 [facility]. ?Rybelsus 3 mg last filled 05/08/2021 [facility] at [facility]. ? ?Medication Fill Gaps: ?None ID ? ?Bessie Kellihan,CPA ?Clinical Pharmacist Assistant ?[phone]  ? ?

## 2021-05-14 ENCOUNTER — Ambulatory Visit
Admission: RE | Admit: 2021-05-14 | Discharge: 2021-05-14 | Disposition: A | Discharge status: Home / Self Care | Payer: Medicare HMO | Source: Ambulatory Visit | Attending: Nurse Practitioner | Admitting: Nurse Practitioner

## 2021-05-14 ENCOUNTER — Ambulatory Visit: Payer: Medicare HMO | Admitting: Internal Medicine

## 2021-05-14 ENCOUNTER — Encounter: Payer: Self-pay | Admitting: Internal Medicine

## 2021-05-14 DIAGNOSIS — K760 Fatty (change of) liver, not elsewhere classified: Secondary | ICD-10-CM | POA: Diagnosis not present

## 2021-05-14 DIAGNOSIS — R634 Abnormal weight loss: Secondary | ICD-10-CM | POA: Diagnosis not present

## 2021-05-14 DIAGNOSIS — R61 Generalized hyperhidrosis: Secondary | ICD-10-CM

## 2021-05-14 DIAGNOSIS — R630 Anorexia: Secondary | ICD-10-CM | POA: Diagnosis not present

## 2021-05-14 DIAGNOSIS — J449 Chronic obstructive pulmonary disease, unspecified: Secondary | ICD-10-CM | POA: Diagnosis not present

## 2021-05-14 MED ORDER — IOPAMIDOL (ISOVUE-300) INJECTION 61%
100.0000 mL | Freq: Once | INTRAVENOUS | Status: AC | PRN
  Administered 2021-05-14: 100 mL via INTRAVENOUS

## 2021-05-14 NOTE — Progress Notes (Signed)
?Subjective:  ?  ? Patient ID: Audrey Russell, female   DOB: 12/15/1955     MRN: [id number] ? ?  ?Brief patient profile:  ?52 yobf from [place]  quit smoking 2019 moved to [place] 2016 with cough/doe x fall   2017referred to pulmonary clinic 06/26/2017 by Dr   Lorayne Marek with wt loss, night sweats and cavitary lesion in RUL post segment. ? ? ? ?06/26/2017 1st [facility] office visit/ Audrey Russell   ?Chief Complaint  ?Patient presents with  ? Pulmonary Consult  ?  Referred by Dr. Wilfred Lacy for eval of abnormal ct chest done 06/12/17.  She states she has been having SOB for the past 1-2 yrs. She states she "coughs constantly"- started back in Sept 2018 but has been getting progressively worse. Her cough is occ prod with clear to yellow sputum.    ?initially started with cough fall 2017 thick yellow / green phlegm no better with abx and never bloody worse in early am and up to a small cup in vol in am reported   ?Doe= MMRC3 = can't walk 100 yards even at a slow pace at a flat grade s stopping due to sob   ?Rec ?Pantoprazole (protonix) 40 mg   Take  30-60 min before first meal of the day and Pepcid (famotidine)  20 mg one after supper until return to office ?GERD diet reviewed, bed blocks  ?Only use your albuterol as a rescue medication   ?Please schedule a follow up office visit in 4 weeks, sooner if needed  with all medications /inhalers/ solutions in hand ? ? ?03/01/2021  [name]   maint on saba no ppi  ?Chief Complaint  ?Patient presents with  ? Acute Visit  ?  Increased DOE and cough since Oct 2022- non prod.   ? Dyspnea:  walks dog daily / [employer] parking ; ?Cough: first thing in am rattling / min mucoid assoc with nasal congestion/ prior f/b [place] ?Sleeping: L side down,  bed is flat 4 x  ?SABA use: 4 x daily with baseline hfa < 50%  ?02: none  ?Covid status:   vax x 2  ?Rec ?Pantoprazole (protonix) 40 mg   Take  30-60 min before first meal of the day and Pepcid (famotidine)   20 mg after supper until return to office  ?GERD diet reviewed, bed blocks rec  ?Prednisone 10 mg take  4 each am x 2 days,   2 each am x 2 days,  1 each am x 2 days and stop  ?Augmentin 875 mg take one pill twice daily  X 10 days ?Plan A = Automatic = Always=    Symbicort 80 Take 2 puffs first thing in am and then another 2 puffs about 12 hours later.  ? Work on inhaler technique:    ?Plan B = Backup (to supplement plan A, not to replace it) ?Only use your albuterol inhaler as a rescue medication  ?If this is working call for alternative to symbicort  ?Please schedule a follow up office visit in 6 weeks, call sooner if needed - bring inhalers  ? ? ?05/14/2021  f/u ov/Audrey Russell re: AB maint on symbicort 80 2bid   ?Chief Complaint  ?Patient presents with  ? Follow-up  ?  Breathing back to her normal baseline. No new co's. She uses her albuterol inhaler maybe once per day.   ?Dyspnea:  better walking neighborhood  ?Cough: none ?Sleeping: flat bed/ pillows  ?  SABA use: maybe once a day "after steps" ?02: none  ?Covid status:   vax x 2  ? ? ?No obvious day to day or daytime variability or assoc excess/ purulent sputum or mucus plugs or hemoptysis or cp or chest tightness, subjective wheeze or overt sinus or hb symptoms.  ? ?Sleeping  without nocturnal  or early am exacerbation  of respiratory  c/o's or need for noct saba. Also denies any obvious fluctuation of symptoms with weather or environmental changes or other aggravating or alleviating factors except as outlined above  ? ?No unusual exposure hx or h/o childhood pna/ asthma or knowledge of premature birth. ? ?Current Allergies, Complete Past Medical History, Past Surgical History, Family History, and Social History were reviewed in [employer] record. ? ?ROS  The following are not active complaints unless bolded ?Hoarseness, sore throat, dysphagia, dental problems, itching, sneezing,  nasal congestion or discharge of excess mucus or purulent  secretions, ear ache,   fever, chills, sweats, unintended wt loss or wt gain, classically pleuritic or exertional cp,  orthopnea pnd or arm/hand swelling  or leg swelling, presyncope, palpitations, abdominal pain, anorexia, nausea, vomiting, diarrhea  or change in bowel habits or change in bladder habits, change in stools or change in urine, dysuria, hematuria,  rash, arthralgias, visual complaints, headache, numbness, weakness or ataxia or problems with walking or coordination,  change in mood or  memory. ?      ? ?Current Meds  ?Medication Sig  ? acetaminophen (TYLENOL) 500 MG tablet Take 1,000 mg by mouth every 6 (six) hours as needed for mild pain or moderate pain.  ? albuterol (VENTOLIN HFA) 108 (90 Base) MCG/ACT inhaler INHALE 1-2 PUFFS INTO THE LUNGS EVERY 6 (SIX) HOURS AS NEEDED FOR WHEEZING OR SHORTNESS OF BREATH.  ? aspirin EC 81 MG tablet Take 1 tablet (81 mg total) by mouth daily.  ? blood glucose meter kit and supplies One Touch Contour Monitor. Check glucose in AM, before breakfast. E11.65  ? budesonide-formoterol (SYMBICORT) 80-4.5 MCG/ACT inhaler Take 2 puffs first thing in am and then another 2 puffs about 12 hours later.  ? cetirizine (ZYRTEC) 10 MG tablet Take 1 tablet (10 mg total) by mouth at bedtime.  ? citalopram (CELEXA) 40 MG tablet TAKE 1 TABLET BY MOUTH EVERY DAY  ? ezetimibe (ZETIA) 10 MG tablet TAKE 1 TABLET BY MOUTH EVERY DAY IN THE EVENING  ? famotidine (PEPCID) 20 MG tablet TAKE 1 TABLET BY MOUTH EVERY DAY  ? fenofibrate 160 MG tablet TAKE 1 TABLET BY MOUTH EVERY DAY  ? fluticasone (FLONASE) 50 MCG/ACT nasal spray SPRAY 2 SPRAYS INTO EACH NOSTRIL EVERY DAY  ? glipiZIDE (GLUCOTROL XL) 5 MG 24 hr tablet TAKE 1 TABLET (5 MG TOTAL) BY MOUTH DAILY AFTER SUPPER.  ? glucose blood (CONTOUR NEXT TEST) test strip 1 each by Other route as needed for other. Use as instructed  ? glucose blood (CONTOUR NEXT TEST) test strip Check glucose before breakfast. E11.65  ? metFORMIN (GLUCOPHAGE) 850 MG  tablet Take 1 tablet (850 mg total) by mouth 2 (two) times daily with a meal.  ? mirtazapine (REMERON) 30 MG tablet TAKE 1 TABLET BY MOUTH AT BEDTIME.  ? ONETOUCH ULTRA test strip USE TO CHECK BLOOD GLUCOSE IN AM BEFORE BREAKFAST  ? pantoprazole (PROTONIX) 40 MG tablet TAKE 1 TABLET (40 MG TOTAL) BY MOUTH DAILY. TAKE 30-60 MIN BEFORE FIRST MEAL OF THE DAY  ? rosuvastatin (CRESTOR) 10 MG tablet TAKE 1 TABLET  BY MOUTH EVERY DAY. PLEASE MAKE OVERDUE APPT WITH DR. ROSS FOR FUTURE REFILLS [phone]. THANK YOU . 2ND ATTEMPT  ? Semaglutide (RYBELSUS) 3 MG TABS Take 1 tablet by mouth daily.  ? sodium chloride (OCEAN) 0.65 % SOLN nasal spray Place 1 spray into both nostrils as needed for congestion.  ? tiZANidine (ZANAFLEX) 4 MG tablet Take 1 tablet (4 mg total) by mouth every 6 (six) hours as needed for muscle spasms.  ?    ?  ? ?  ? ?   ?Objective:  ? Physical Exam ?  ?Wts ? ?05/14/2021       156  ?03/01/2021         155   ?06/26/17 164 lb (74.4 kg)  ?05/07/17 165 lb (74.8 kg)  ?05/06/17 164 lb 6.4 oz (74.6 kg)  ?  ?Vital signs reviewed  05/14/2021  - Note at rest 02 sats  96% on RA  ? ?General appearance:    pleasant amb bf nad  ? ? ? ?HEENT : pt wearing mask not removed for exam due to covid - 19 concerns.  ? ? ?NECK :  without JVD/Nodes/TM/ nl carotid upstrokes bilaterally ? ? ?LUNGS: no acc muscle use,  Mild barrel  contour chest wall with bilateral  Distant bs s audible wheeze and  without cough on insp or exp maneuvers  and mild  Hyperresonant  to  percussion bilaterally   ? ? ?CV:  RRR  no s3 or murmur or increase in P2, and no edema  ? ?ABD:  soft and nontender with pos end  insp Hoover's  in the supine position. No bruits or organomegaly appreciated, bowel sounds nl ? ?MS:   Nl gait/  ext warm without deformities, calf tenderness, cyanosis or clubbing ?No obvious joint restrictions  ? ?SKIN: warm and dry without lesions   ? ?NEURO:  alert, approp, nl sensorium with  no motor or cerebellar deficits apparent.  ?     ?   ? ?I personally reviewed images and agree with radiology impression as follows:  ? Chest CT w contrast 02/23/21 ?Severe centrilobular emphysema with scattered parenchymal scarring status post right ?upper l

## 2021-05-14 NOTE — Patient Instructions (Signed)
No change on your medications ? ?Work on inhaler technique:  relax and gently blow all the way out then take a nice smooth full deep breath back in, triggering the inhaler at same time you start breathing in.  Hold for up to 5 seconds if you can. Blow out thru nose. Rinse and gargle with water when done.  If mouth or throat bother you at all,  try brushing teeth/gums/tongue with arm and hammer toothpaste/ make a slurry and gargle and spit out.  ? ?   ?Only use your albuterol as a rescue medication to be used if you can't catch your breath by resting or doing a relaxed purse lip breathing pattern.  ?- The less you use it, the better it will work when you need it. ?- Ok to use up to 2 puffs  every 4 hours if you must but call for immediate appointment if use goes up over your usual need ?- Don't leave home without it !!  (think of it like the starter for your car)  ? ?Ok to try albuterol 15 min before an activity (on alternating days)  that you know would usually make you short of breath and see if it makes any difference and if makes none then don't take albuterol after activity unless you can't catch your breath as this means it's the resting that helps, not the albuterol. ? ? ?Please schedule a follow up visit in 6 months but call sooner if needed  ? ? ?    ?

## 2021-05-14 NOTE — Assessment & Plan Note (Signed)
Stopped smoking 2019  ?- -  pfts 07/22/17 >  Nl x dlco 37 corrects to 50 ?- 03/01/2021  continue symbicort 80 2bid and added max rx for gerd > resolved  ?- Allergy profile 03/01/21  >  Eos 0.1 /  IgE  10 ?- 05/14/2021  After extensive coaching inhaler device,  effectiveness =    75% (short Ti) ? ?All goals of chronic asthma control met including optimal function and elimination of symptoms with minimal need for rescue therapy. ? ?Contingencies discussed in full including contacting this office immediately if not controlling the symptoms using the rule of two's.    ? ?F/u q 6 m, sooner prn  ? ?    ?  ? ?Each maintenance medication was reviewed in detail including emphasizing most importantly the difference between maintenance and prns and under what circumstances the prns are to be triggered using an action plan format where appropriate. ? ?Total time for H and P, chart review, counseling, reviewing hfa device(s) and generating customized AVS unique to this office visit / same day charting = 22 min  ?     ?

## 2021-05-17 ENCOUNTER — Encounter: Payer: Self-pay | Admitting: Internal Medicine

## 2021-05-17 ENCOUNTER — Ambulatory Visit (INDEPENDENT_AMBULATORY_CARE_PROVIDER_SITE_OTHER): Payer: Medicare HMO

## 2021-05-17 DIAGNOSIS — J449 Chronic obstructive pulmonary disease, unspecified: Secondary | ICD-10-CM

## 2021-05-17 DIAGNOSIS — E1169 Type 2 diabetes mellitus with other specified complication: Secondary | ICD-10-CM

## 2021-05-17 DIAGNOSIS — I700 Atherosclerosis of aorta: Secondary | ICD-10-CM

## 2021-05-17 DIAGNOSIS — F325 Major depressive disorder, single episode, in full remission: Secondary | ICD-10-CM

## 2021-05-17 NOTE — Patient Instructions (Signed)
Visit Information ?It was great speaking with you today!  Please let me know if you have any questions about our visit. ? ? Goals Addressed   ? ?  ?  ?  ?  ? This Visit's Progress  ?  Monitor and Manage My Blood Sugar-Diabetes Type 2     ?  Timeframe:  Long-Range Goal ?Priority:  High ?Start Date: 05/17/21                            ?Expected End Date: 05/18/22                     ? ?Follow Up within 90 days ?  ?- check blood sugar at prescribed times ?- check blood sugar if I feel it is too high or too low ?- enter blood sugar readings and medication or insulin into daily log ?- take the blood sugar log to all doctor visits  ?  ?Why is this important?   ?Checking your blood sugar at home helps to keep it from getting very high or very low.  ?Writing the results in a diary or log helps the doctor know how to care for you.  ?Your blood sugar log should have the time, date and the results.  ?Also, write down the amount of insulin or other medicine that you take.  ?Other information, like what you ate, exercise done and how you were feeling, will also be helpful.   ?  ?Notes:  ?  ? ?  ? ? ?Patient Care Plan: General Pharmacy (Adult)  ?  ? ?Problem Identified: Hypertension, Hyperlipidemia, Diabetes, Depression, and Chronic Pain   ?Priority: High  ?  ? ?Long-Range Goal: Patient-Specific Goal   ?Start Date: 05/17/2021  ?Expected End Date: 05/18/2022  ?This Visit's Progress: On track  ?Priority: High  ?Note:   ?Current Barriers:  ?Unable to achieve control of diabetes  ? ?Pharmacist Clinical Goal(s):  ?Patient will achieve control of diabetes as evidenced by A1c less than 7% through collaboration with PharmD and provider.  ? ?Interventions: ?1:1 collaboration with [name], [name], NP regarding development and update of comprehensive plan of care as evidenced by provider attestation and co-signature ?Inter-disciplinary care team collaboration (see longitudinal plan of care) ?Comprehensive medication review performed;  medication list updated in electronic medical record ? ?Hyperlipidemia: (LDL goal < 70) ?-Uncontrolled ?-Current treatment: ?Ezetimibe 10 mg daily  ?Fenofibrate 160 mg daily  ?Rosuvastatin 10 mg daily  ?-Medications previously tried: NA  ?-Has begun cutting down on fast food, upcoming follow-up with lipid clinic.  ?-Counseled on diet and exercise extensively ?Recommended to continue current medication ? ?Diabetes (A1c goal <7%) ?-Uncontrolled ?-Current medications: ?Glipizide XL 5 mg daily: Appropriate, Effective, query safe.  ?Metformin 850 mg twice daily: Appropriate, Effective, Safe, Accessible  ?Rybelsus 3 mg daily: Appropriate, Effective, Safe, Accessible  ?-Medications previously tried: NA  ?-Current home glucose readings ?fasting glucose: 114-130s ?-Denies hypoglycemic/hyperglycemic symptoms ?-Wants to avoid dialysis  ?-Current meal patterns: Previously eating fast food twice weekly, cut down after A1c went up.  ?breakfast: Typically skips ?lunch: Salad   ?dinner: Baked chicken OR hamburgers. Chicken salad + ranch.  ?snacks: Rarely snacks  ?drinks: Mainly water. No sugar cranberry juice.  ?-Current exercise: Walks most mornings  ?-Blood sugars with significant improvement since cutting down on fast food. ?-Recommend increasing Rybelsus to 7 mg daily  ?-Recommend STOPPING Glipizide ? ?Asthma (Goal: control symptoms and prevent exacerbations) ?-Controlled ?-Current  treatment  ?Ventolin HFA 1-2 puffs every hours as needed  ?Symbicort 80-4.5 mcg 2 puffs twice daily ?-Current treatment  ?Cetirizine 10 mg nightly  ?Flonase 2 sprays daily   ?Saline nasal spray   ?-Medications previously tried: NA  ?-Recommended to continue current medication ? ?Depression/Anxiety (Goal: Maintain symptom remission) ?-Controlled ?-Current treatment: ?Citalopram 40 mg daily: Appropriate, Effective, Safe, Accessible  ?Mirtazapine 30 mg nightly: Appropriate, Effective, Safe, Accessible  ?-Medications previously tried/failed:  NA ?-Recommended to continue current medication ? ?GERD (Goal: Minimize Heartburn) ?-Controlled ?-Current treatment  ?Famotidine 20 mg daily: Appropriate, Effective, Safe, Accessible ?Pantoprazole 40 mg daily: Appropriate, Effective, Safe, Accessible  ?-Medications previously tried: NA ?-Symptoms in excellent control, could consider stopping PPI use and seeing if she remains in good control.  ?-Recommended to continue current medication ? ?Patient Goals/Self-Care Activities ?Patient will:  ?- check glucose daily before breakfast, document, and provide at future appointments ? ?Follow Up Plan: Telephone follow up appointment with care management team member scheduled for:  06/21/2021 at 10:15 AM ?  ? ?Ms. Doe was given information about Chronic Care Management services today including:  ?CCM service includes personalized support from designated clinical staff supervised by her physician, including individualized plan of care and coordination with other care providers ?24/7 contact phone numbers for assistance for urgent and routine care needs. ?Standard insurance, coinsurance, copays and deductibles apply for chronic care management only during months in which we provide at least 20 minutes of these services. Most insurances cover these services at 100%, however patients may be responsible for any copay, coinsurance and/or deductible if applicable. This service may help you avoid the need for more expensive face-to-face services. ?Only one practitioner may furnish and bill the service in a calendar month. ?The patient may stop CCM services at any time (effective at the end of the month) by phone call to the office staff. ? ?Patient agreed to services and verbal consent obtained.  ? ?Patient verbalizes understanding of instructions and care plan provided today and agrees to view in [place]. Active MyChart status confirmed with patient.   ? ?Junius Argyle, PharmD, BCACP, CPP ?Clinical Pharmacist Practitioner   ?[facility]  ?[phone]  ?

## 2021-05-17 NOTE — Progress Notes (Signed)
? ?Chronic Care Management ?Pharmacy Note ? ?05/17/2021 ?Name:  Audrey Russell MRN:  [id number] DOB:  [birth year] ? ?Summary: ?Patient presents for initial CCM consult. We were unable to fully review her medications prior to her having to leave.  ? ?Recommendations/Changes made from today's visit: ?-Recommend increasing Rybelsus to 7 mg daily  ?-Recommend STOPPING Glipizide ? ?Plan: ?CPP follow-up 1 month  ? ? ?Subjective: ?Audrey Russell is an 66 y.o. year old female who is a primary patient of [name], [name], NP.  The CCM team was consulted for assistance with disease management and care coordination needs.   ? ?Engaged with patient by telephone for initial visit in response to provider referral for pharmacy case management and/or care coordination services.  ? ?Consent to Services:  ?The patient was given the following information about Chronic Care Management services today, agreed to services, and gave verbal consent: 1. CCM service includes personalized support from designated clinical staff supervised by the primary care provider, including individualized plan of care and coordination with other care providers 2. 24/7 contact phone numbers for assistance for urgent and routine care needs. 3. Service will only be billed when office clinical staff spend 20 minutes or more in a month to coordinate care. 4. Only one practitioner may furnish and bill the service in a calendar month. 5.The patient may stop CCM services at any time (effective at the end of the month) by phone call to the office staff. 6. The patient will be responsible for cost sharing (co-pay) of up to 20% of the service fee (after annual deductible is met). Patient agreed to services and consent obtained. ? ?Patient Care Team: ?[name], [name], NP as PCP - General (Internal Medicine) ?Fay Records, MD as PCP - Cardiology (Cardiology) ?Germaine Pomfret, [facility] as Pharmacist (Pharmacist) ? ?Recent office  visits: ?04/24/2021 Randel Pigg LPN (PCP Office) Medicare wellness Completed, No medication Changes noted ?04/18/2021 Wilfred Lacy NP (PCP) No medication changes noted, return in 3 months, AMB Referral to [facility] ?03/16/2021 Vance Peper NP (PCP Office) Start lidocaine (LIDODERM) 5 %, start Tizanidine 4 mg PRN, toradol injection given , return in 3-4 weeks ?11/24/2020 Wilfred Lacy NP (PCP) No Medication Changes noted, Follow up in 3 months ? ?Recent consult visits: ?03/13/2021 Dr. Melvyn Novas MD (Pulmonary) start Diflucan 100 mg daily x3 days  ?05/03/2021 Jayme Cloud RN (Gastroenterology) Start Na Sulfate-K Sulfate-Mg Sulf 17.5-3.13-1.6 GM/177 ML , Ambulatory referral to Gastroenterology ?03/13/2021 Dr. Melvyn Novas MD (Pulmonary) start Diflucan 100 mg daily x3 days  ?03/01/2021 Dr. Melvyn Novas MD (Pulmonary) start Augmentin 875-125 mg, start Symbicort 80-4.5 MCG/ACT, start Pantoprazole 40 mg daily, start Prednisone 10 mg . ?02/26/2021 Dr. Julien Nordmann MD (Oncology) No Medication Changes noted ? ?Hospital visits: ?None in previous 6 months ? ? ?Objective: ? ?Lab Results  ?Component Value Date  ? CREATININE 0.79 02/23/2021  ? BUN 10 02/23/2021  ? GFR 72.17 11/24/2020  ? GFRNONAA >60 02/23/2021  ? GFRAA >60 05/07/2019  ? NA 138 02/23/2021  ? K 4.2 02/23/2021  ? CALCIUM 9.5 02/23/2021  ? CO2 26 02/23/2021  ? GLUCOSE 198 (H) 02/23/2021  ? ? ?Lab Results  ?Component Value Date/Time  ? HGBA1C 8.8 (H) 04/26/2021 09:53 AM  ? HGBA1C 7.6 (H) 11/24/2020 10:43 AM  ? GFR 72.17 11/24/2020 10:43 AM  ? GFR 70.31 08/17/2020 02:16 PM  ? MICROALBUR 6.6 (H) 04/18/2021 02:12 PM  ? MICROALBUR 0.9 02/18/2020 02:50 PM  ?  ?Last diabetic Eye exam:  ?Lab Results  ?  Component Value Date/Time  ? HMDIABEYEEXA No Retinopathy 09/15/2019 12:00 AM  ?  ?Last diabetic Foot exam:  ?Lab Results  ?Component Value Date/Time  ? HMDIABFOOTEX no DM retinopathy 12/23/2017 12:00 AM  ?  ? ?Lab Results  ?Component Value Date  ? CHOL 151 04/26/2021  ? HDL 36.20  (L) 04/26/2021  ? [place] 87 04/26/2021  ? LDLDIRECT 73.0 08/17/2020  ? TRIG 136.0 04/26/2021  ? CHOLHDL 4 04/26/2021  ? ? ? ?  Latest Ref Rng & Units 02/23/2021  ?  8:33 AM 11/24/2020  ? 10:43 AM 08/17/2020  ?  2:16 PM  ?Hepatic Function  ?Total Protein 6.5 - 8.1 g/dL 7.8   7.6   7.7    ?Albumin 3.5 - 5.0 g/dL 4.3   4.5   4.5    ?AST 15 - 41 U/L $Remo'18   19   20    'FyxjN$ ?ALT 0 - 44 U/L $Remo'15   16   16    'VZySC$ ?Alk Phosphatase 38 - 126 U/L 69   63   62    ?Total Bilirubin 0.3 - 1.2 mg/dL 0.3   0.3   0.3    ?Bilirubin, Direct 0.0 - 0.3 mg/dL  0.1   0.1    ? ? ?Lab Results  ?Component Value Date/Time  ? TSH 0.97 11/24/2020 10:43 AM  ? TSH 0.96 08/11/2018 02:16 PM  ? ? ? ?  Latest Ref Rng & Units 04/26/2021  ?  9:53 AM 03/01/2021  ?  3:56 PM 02/23/2021  ?  8:33 AM  ?CBC  ?WBC 4.0 - 10.5 K/uL 7.8   11.3   9.2    ?Hemoglobin 12.0 - 15.0 g/dL 14.6   15.4   14.8    ?Hematocrit 36.0 - 46.0 % 44.1   46.7   45.5    ?Platelets 150.0 - 400.0 K/uL 259.0   286.0   283    ? ? ?No results found for: VD25OH ? ?Clinical ASCVD: No  ?The 10-year ASCVD risk score (Arnett DK, et al., 2019) is: 11.5% ?  Values used to calculate the score: ?    Age: 2 years ?    Sex: Female ?    Is Non-Hispanic African American: Yes ?    Diabetic: Yes ?    Tobacco smoker: No ?    Systolic Blood Pressure: 601 mmHg ?    Is BP treated: No ?    HDL Cholesterol: 36.2 mg/dL ?    Total Cholesterol: 151 mg/dL   ? ? ?  04/24/2021  ?  8:37 AM 04/24/2021  ?  8:32 AM 04/18/2021  ?  2:14 PM  ?Depression screen PHQ 2/9  ?Decreased Interest 0 0 0  ?Down, Depressed, Hopeless 0 0 0  ?PHQ - 2 Score 0 0 0  ?Altered sleeping   2  ?Tired, decreased energy   0  ?Change in appetite   3  ?Feeling bad or failure about yourself    0  ?Trouble concentrating   0  ?Moving slowly or fidgety/restless   0  ?Suicidal thoughts   0  ?PHQ-9 Score   5  ?Difficult doing work/chores   Somewhat difficult  ?  ?Social History  ? ?Tobacco Use  ?Smoking Status Former  ? Packs/day: 0.50  ? Years: 44.00  ? Pack years: 22.00  ?  Types: Cigarettes  ? Quit date: 08/11/2017  ? Years since quitting: 3.7  ? Passive exposure: Current  ?Smokeless Tobacco Never  ? ?BP Readings from  Last 3 Encounters:  ?05/14/21 112/66  ?04/18/21 112/62  ?03/16/21 124/72  ? ?Pulse Readings from Last 3 Encounters:  ?05/14/21 81  ?04/18/21 60  ?03/16/21 90  ? ?Wt Readings from Last 3 Encounters:  ?05/14/21 156 lb (70.8 kg)  ?05/03/21 150 lb (68 kg)  ?04/18/21 150 lb 3.2 oz (68.1 kg)  ? ?BMI Readings from Last 3 Encounters:  ?05/14/21 24.43 kg/m?  ?05/03/21 23.49 kg/m?  ?04/18/21 23.52 kg/m?  ? ? ?Assessment/Interventions: Review of patient past medical history, allergies, medications, health status, including review of consultants reports, laboratory and other test data, was performed as part of comprehensive evaluation and provision of chronic care management services.  ? ?SDOH:  (Social Determinants of Health) assessments and interventions performed: Yes ?SDOH Interventions   ? ?Flowsheet Row Most Recent Value  ?SDOH Interventions   ?Financial Strain Interventions Intervention Not Indicated  ?Transportation Interventions Intervention Not Indicated  ? ?  ? ?SDOH Screenings  ? ?Alcohol Screen: Low Risk   ? Last Alcohol Screening Score (AUDIT): 0  ?Depression (PHQ2-9): Low Risk   ? PHQ-2 Score: 0  ?Financial Resource Strain: Low Risk   ? Difficulty of Paying Living Expenses: Not hard at all  ?Food Insecurity: No Food Insecurity  ? Worried About Charity fundraiser in the Last Year: Never true  ? Ran Out of Food in the Last Year: Never true  ?Housing: Low Risk   ? Last Housing Risk Score: 0  ?Physical Activity: Sufficiently Active  ? Days of Exercise per Week: 3 days  ? Minutes of Exercise per Session: 50 min  ?Social Connections: Moderately Isolated  ? Frequency of Communication with Friends and Family: Twice a week  ? Frequency of Social Gatherings with Friends and Family: Twice a week  ? Attends Religious Services: More than 4 times per year  ? Active Member of Clubs  or Organizations: No  ? Attends Archivist Meetings: Never  ? Marital Status: Divorced  ?Stress: No Stress Concern Present  ? Feeling of Stress : Not at all  ?Tobacco Use: Medium Risk  ? Smoking Tobacco Use: Fo

## 2021-05-18 ENCOUNTER — Telehealth: Payer: Self-pay | Admitting: Nurse Practitioner

## 2021-05-18 DIAGNOSIS — E1165 Type 2 diabetes mellitus with hyperglycemia: Secondary | ICD-10-CM

## 2021-05-18 MED ORDER — RYBELSUS 7 MG PO TABS: 7.0000 mg | ORAL_TABLET | Freq: Every day | ORAL | 3 refills | Status: DC

## 2021-05-18 NOTE — Assessment & Plan Note (Signed)
hgbA1c at 8: d/c glipizide ?Increase rybelsus to 7mg  daily ?

## 2021-05-18 NOTE — Telephone Encounter (Signed)
See note

## 2021-05-20 ENCOUNTER — Encounter: Payer: Self-pay | Admitting: Certified Registered Nurse Anesthetist

## 2021-05-20 DIAGNOSIS — R69 Illness, unspecified: Secondary | ICD-10-CM | POA: Diagnosis not present

## 2021-05-20 DIAGNOSIS — Z7984 Long term (current) use of oral hypoglycemic drugs: Secondary | ICD-10-CM

## 2021-05-20 DIAGNOSIS — E785 Hyperlipidemia, unspecified: Secondary | ICD-10-CM

## 2021-05-20 DIAGNOSIS — J449 Chronic obstructive pulmonary disease, unspecified: Secondary | ICD-10-CM | POA: Diagnosis not present

## 2021-05-20 DIAGNOSIS — E1159 Type 2 diabetes mellitus with other circulatory complications: Secondary | ICD-10-CM

## 2021-05-20 DIAGNOSIS — F32A Depression, unspecified: Secondary | ICD-10-CM

## 2021-05-28 ENCOUNTER — Encounter: Payer: Medicare HMO | Admitting: Internal Medicine

## 2021-06-12 ENCOUNTER — Ambulatory Visit: Payer: Medicare HMO | Admitting: Internal Medicine

## 2021-06-21 ENCOUNTER — Telehealth: Payer: Medicare HMO

## 2021-06-21 NOTE — Progress Notes (Deleted)
Chronic Care Management Pharmacy Note  06/21/2021 Name:  Audrey Russell MRN:  [id number] DOB:  03/04/1955  Summary: Patient presents for initial CCM consult. We were unable to fully review her medications prior to her having to leave.   Recommendations/Changes made from today's visit: -Recommend increasing Rybelsus to 7 mg daily  -Recommend STOPPING Glipizide  Plan: CPP follow-up 1 month    Subjective: Audrey Russell is an 66 y.o. year old female who is a primary patient of [name], [name], NP.  The CCM team was consulted for assistance with disease management and care coordination needs.    Engaged with patient by telephone for initial visit in response to provider referral for pharmacy case management and/or care coordination services.   Consent to Services:  The patient was given the following information about Chronic Care Management services today, agreed to services, and gave verbal consent: 1. CCM service includes personalized support from designated clinical staff supervised by the primary care provider, including individualized plan of care and coordination with other care providers 2. 24/7 contact phone numbers for assistance for urgent and routine care needs. 3. Service will only be billed when office clinical staff spend 20 minutes or more in a month to coordinate care. 4. Only one practitioner may furnish and bill the service in a calendar month. 5.The patient may stop CCM services at any time (effective at the end of the month) by phone call to the office staff. 6. The patient will be responsible for cost sharing (co-pay) of up to 20% of the service fee (after annual deductible is met). Patient agreed to services and consent obtained.  Patient Care Team: [name], [name], NP as PCP - General (Internal Medicine) Fay Records, MD as PCP - Cardiology (Cardiology) Germaine Pomfret, [facility] as Pharmacist (Pharmacist)  Recent office  visits: 04/24/2021 Randel Pigg LPN (PCP Office) Medicare wellness Completed, No medication Changes noted 04/18/2021 Wilfred Lacy NP (PCP) No medication changes noted, return in 3 months, AMB Referral to [facility] 03/16/2021 Vance Peper NP (PCP Office) Start lidocaine (LIDODERM) 5 %, start Tizanidine 4 mg PRN, toradol injection given , return in 3-4 weeks 11/24/2020 Wilfred Lacy NP (PCP) No Medication Changes noted, Follow up in 3 months  Recent consult visits: 03/13/2021 Dr. Melvyn Novas MD (Pulmonary) start Diflucan 100 mg daily x3 days  05/03/2021 Jayme Cloud RN (Gastroenterology) Start Na Sulfate-K Sulfate-Mg Sulf 17.5-3.13-1.6 GM/177 ML , Ambulatory referral to Gastroenterology 03/13/2021 Dr. Melvyn Novas MD (Pulmonary) start Diflucan 100 mg daily x3 days  03/01/2021 Dr. Melvyn Novas MD (Pulmonary) start Augmentin 875-125 mg, start Symbicort 80-4.5 MCG/ACT, start Pantoprazole 40 mg daily, start Prednisone 10 mg . 02/26/2021 Dr. Julien Nordmann MD (Oncology) No Medication Changes noted  Hospital visits: None in previous 6 months   Objective:  Lab Results  Component Value Date   CREATININE 0.79 02/23/2021   BUN 10 02/23/2021   GFR 72.17 11/24/2020   GFRNONAA >60 02/23/2021   GFRAA >60 05/07/2019   NA 138 02/23/2021   K 4.2 02/23/2021   CALCIUM 9.5 02/23/2021   CO2 26 02/23/2021   GLUCOSE 198 (H) 02/23/2021    Lab Results  Component Value Date/Time   HGBA1C 8.8 (H) 04/26/2021 09:53 AM   HGBA1C 7.6 (H) 11/24/2020 10:43 AM   GFR 72.17 11/24/2020 10:43 AM   GFR 70.31 08/17/2020 02:16 PM   MICROALBUR 6.6 (H) 04/18/2021 02:12 PM   MICROALBUR 0.9 02/18/2020 02:50 PM    Last diabetic Eye exam:  Lab Results  Component Value Date/Time   HMDIABEYEEXA No Retinopathy 09/15/2019 12:00 AM    Last diabetic Foot exam:  Lab Results  Component Value Date/Time   HMDIABFOOTEX no DM retinopathy 12/23/2017 12:00 AM     Lab Results  Component Value Date   CHOL 151 04/26/2021   HDL 36.20  (L) 04/26/2021   LDLCALC 87 04/26/2021   LDLDIRECT 73.0 08/17/2020   TRIG 136.0 04/26/2021   CHOLHDL 4 04/26/2021       Latest Ref Rng & Units 02/23/2021    8:33 AM 11/24/2020   10:43 AM 08/17/2020    2:16 PM  Hepatic Function  Total Protein 6.5 - 8.1 g/dL 7.8   7.6   7.7    Albumin 3.5 - 5.0 g/dL 4.3   4.5   4.5    AST 15 - 41 U/L _0 ALT 0 - 44 U/L _1 Alk Phosphatase 38 - 126 U/L 69   63   62    Total Bilirubin 0.3 - 1.2 mg/dL 0.3   0.3   0.3    Bilirubin, Direct 0.0 - 0.3 mg/dL  0.1   0.1      Lab Results  Component Value Date/Time   TSH 0.97 11/24/2020 10:43 AM   TSH 0.96 08/11/2018 02:16 PM       Latest Ref Rng & Units 04/26/2021    9:53 AM 03/01/2021    3:56 PM 02/23/2021    8:33 AM  CBC  WBC 4.0 - 10.5 K/uL 7.8   11.3   9.2    Hemoglobin 12.0 - 15.0 g/dL 14.6   15.4   14.8    Hematocrit 36.0 - 46.0 % 44.1   46.7   45.5    Platelets 150.0 - 400.0 K/uL 259.0   286.0   283      No results found for: VD25OH  Clinical ASCVD: No  The 10-year ASCVD risk score (Arnett DK, et al., 2019) is: 11.5%   Values used to calculate the score:     Age: 37 years     Sex: Female     Is Non-Hispanic African American: Yes     Diabetic: Yes     Tobacco smoker: No     Systolic Blood Pressure: 982 mmHg     Is BP treated: No     HDL Cholesterol: 36.2 mg/dL     Total Cholesterol: 151 mg/dL       04/24/2021    8:37 AM 04/24/2021    8:32 AM 04/18/2021    2:14 PM  Depression screen PHQ 2/9  Decreased Interest 0 0 0  Down, Depressed, Hopeless 0 0 0  PHQ - 2 Score 0 0 0  Altered sleeping   2  Tired, decreased energy   0  Change in appetite   3  Feeling bad or failure about yourself    0  Trouble concentrating   0  Moving slowly or fidgety/restless   0  Suicidal thoughts   0  PHQ-9 Score   5  Difficult doing work/chores   Somewhat difficult    Social History   Tobacco Use  Smoking Status Former   Packs/day: 0.50   Years: 44.00   Pack years: 22.00    Types: Cigarettes   Quit date: 08/11/2017   Years since quitting: 3.8   Passive exposure: Current  Smokeless Tobacco Never   BP Readings from  Last 3 Encounters:  05/14/21 112/66  04/18/21 112/62  03/16/21 124/72   Pulse Readings from Last 3 Encounters:  05/14/21 81  04/18/21 60  03/16/21 90   Wt Readings from Last 3 Encounters:  05/14/21 156 lb (70.8 kg)  05/03/21 150 lb (68 kg)  04/18/21 150 lb 3.2 oz (68.1 kg)   BMI Readings from Last 3 Encounters:  05/14/21 24.43 kg/m  05/03/21 23.49 kg/m  04/18/21 23.52 kg/m    Assessment/Interventions: Review of patient past medical history, allergies, medications, health status, including review of consultants reports, laboratory and other test data, was performed as part of comprehensive evaluation and provision of chronic care management services.   SDOH:  (Social Determinants of Health) assessments and interventions performed: Yes   SDOH Screenings   Alcohol Screen: Low Risk    Last Alcohol Screening Score (AUDIT): 0  Depression (PHQ2-9): Low Risk    PHQ-2 Score: 0  Financial Resource Strain: Low Risk    Difficulty of Paying Living Expenses: Not hard at all  Food Insecurity: No Food Insecurity   Worried About Charity fundraiser in the Last Year: Never true   Ran Out of Food in the Last Year: Never true  Housing: Low Risk    Last Housing Risk Score: 0  Physical Activity: Sufficiently Active   Days of Exercise per Week: 3 days   Minutes of Exercise per Session: 50 min  Social Connections: Moderately Isolated   Frequency of Communication with Friends and Family: Twice a week   Frequency of Social Gatherings with Friends and Family: Twice a week   Attends Religious Services: More than 4 times per year   Active Member of [employer] or Organizations: No   Attends Music therapist: Never   Marital Status: Divorced  Stress: No Stress Concern Present   Feeling of Stress : Not at all  Tobacco Use: Medium Risk    Smoking Tobacco Use: Former   Smokeless Tobacco Use: Never   Passive Exposure: Current  Transportation Needs: No Data processing manager (Medical): No   Lack of Transportation (Non-Medical): No    CCM Care Plan  No Known Allergies  Medications Reviewed Today     Reviewed by Germaine Pomfret, [facility] (Pharmacist) on 05/17/21 at 1514  Med List Status: <None>   Medication Order Taking? Sig Documenting Provider Last Dose Status Informant  acetaminophen (TYLENOL) 500 MG tablet [id number]  Take 1,000 mg by mouth every 6 (six) hours as needed for mild pain or moderate pain. [provider]  Active Self  albuterol (VENTOLIN HFA) 108 (90 Base) MCG/ACT inhaler [id number]  INHALE 1-2 PUFFS INTO THE LUNGS EVERY 6 (SIX) HOURS AS NEEDED FOR WHEEZING OR SHORTNESS OF BREATH. Flossie Buffy, NP  Active   aspirin EC 81 MG tablet [id number]  Take 1 tablet (81 mg total) by mouth daily. Fay Records, MD  Active Self  blood glucose meter kit and supplies [id number]  One Touch Contour Monitor. Check glucose in AM, before breakfast. E11.65 [name], [name], NP  Active   budesonide-formoterol [facility]) 80-4.5 MCG/ACT inhaler [id number]  Take 2 puffs first thing in am and then another 2 puffs about 12 hours later. Tanda Rockers, MD  Active   cetirizine (ZYRTEC) 10 MG tablet [id number]  Take 1 tablet (10 mg total) by mouth at bedtime. Flossie Buffy, NP  Active   citalopram (CELEXA) 40 MG tablet [id number]  TAKE 1 TABLET BY MOUTH EVERY DAY [name],  [name], NP  Active   ezetimibe (ZETIA) 10 MG tablet [id number]  TAKE 1 TABLET BY MOUTH EVERY DAY IN THE EVENING [name], [name], NP  Active   famotidine (PEPCID) 20 MG tablet [id number] Yes TAKE 1 TABLET BY MOUTH EVERY DAY [name], [name], NP Taking Active   fenofibrate 160 MG tablet [id number]  TAKE 1 TABLET BY MOUTH EVERY DAY [name], [name], NP  Active   fluticasone (FLONASE) 50 MCG/ACT nasal spray [id number]  SPRAY  2 SPRAYS INTO EACH NOSTRIL EVERY DAY [name], [name], NP  Active   glipiZIDE (GLUCOTROL XL) 5 MG 24 hr tablet [id number] Yes TAKE 1 TABLET (5 MG TOTAL) BY MOUTH DAILY AFTER SUPPER. [name], [name], NP Taking Active   glucose blood (CONTOUR NEXT TEST) test strip [id number]  1 each by Other route as needed for other. Use as instructed [provider]  Active   glucose blood (CONTOUR NEXT TEST) test strip [id number]  Check glucose before breakfast. E11.65 [name], [name], NP  Active   metFORMIN (GLUCOPHAGE) 850 MG tablet [id number] Yes Take 1 tablet (850 mg total) by mouth 2 (two) times daily with a meal. [name], [name], NP Taking Active   mirtazapine (REMERON) 30 MG tablet [id number]  TAKE 1 TABLET BY MOUTH AT BEDTIME. Flossie Buffy, NP  Active   [facility] ULTRA test strip [id number]  USE TO CHECK BLOOD GLUCOSE IN AM BEFORE BREAKFAST [name], [name], NP  Active   pantoprazole (PROTONIX) 40 MG tablet [id number] Yes TAKE 1 TABLET (40 MG TOTAL) BY MOUTH DAILY. TAKE 30-60 MIN BEFORE FIRST MEAL OF THE DAY Tanda Rockers, MD Taking Active   rosuvastatin (CRESTOR) 10 MG tablet [id number]  TAKE 1 TABLET BY MOUTH EVERY DAY. PLEASE MAKE OVERDUE APPT WITH DR. ROSS FOR FUTURE REFILLS [phone]. [place] ATTEMPT Fay Records, MD  Active   Semaglutide (RYBELSUS) 3 MG TABS [id number] Yes Take 1 tablet by mouth daily. [name], [name], NP Taking Active   sodium chloride (OCEAN) 0.65 % SOLN nasal spray [id number]  Place 1 spray into both nostrils as needed for congestion. [name], [name], NP  Active Self  tiZANidine (ZANAFLEX) 4 MG tablet [id number]  Take 1 tablet (4 mg total) by mouth every 6 (six) hours as needed for muscle spasms. Charyl Dancer, NP  Active             Patient Active Problem List   Diagnosis Date Noted   Night sweat 04/18/2021   Acute left-sided low back pain with left-sided sciatica 03/16/2021   Asthmatic bronchitis , chronic ([place]) 08/17/2020    Tubular adenoma of colon 08/17/2020   Atherosclerosis of aorta ([place]) 02/16/2019   Onychomycosis 08/11/2018   Liver disease 02/11/2018   Hepatic lesion 02/11/2018   Adenocarcinoma of right lung, stage 1 ([place]) 11/04/2017   S/P lobectomy of lung 08/15/2017   DOE (dyspnea on exertion) 06/27/2017   Former cigarette smoker 06/27/2017   Pulmonary infiltrates 06/26/2017   Headache 11/04/2016   Abdominal pain 05/25/2016   Bipolar affective ([place]) 05/24/2016   MS (multiple sclerosis) ([place]) 05/24/2016   Essential hypertension 04/04/2016   DM (diabetes mellitus) ([place]) 04/04/2016   Chronic pain 04/04/2016   Depression, major, single episode, complete remission ([place]) 04/04/2016   Hyperlipidemia associated with type 2 diabetes mellitus ([place]) 04/04/2016   Leg weakness, bilateral 04/04/2016    Immunization History  Administered Date(s) Administered   Fluad Quad(high Dose 65+) 10/02/2018   Influenza, High  Dose Seasonal PF 01/28/2017, 02/10/2018   Influenza,inj,Quad PF,6+ Mos 01/17/2020, 10/24/2020   PFIZER(Purple Top)SARS-COV-2 Vaccination 04/10/2019, 05/01/2019, 01/17/2020   Pneumococcal Conjugate-13 07/31/2017   Pneumococcal Polysaccharide-23 09/02/2018   Tdap 01/28/2017   Zoster Recombinat (Shingrix) 11/24/2020    Conditions to be addressed/monitored:  Hypertension, Hyperlipidemia, Diabetes, Depression, and Chronic Pain  There are no care plans that you recently modified to display for this patient.     Medication Assistance: None required.  Patient affirms current coverage meets needs.  Compliance/Adherence/Medication fill history: Care Gaps: COVID-19 Vaccine Colonoscopy  Ophthalmology Exam Shingrix Vaccine  Star-Rating Drugs: Glipizide 5 mg last filled 04/10/2021 [facility]. Rosuvastatin 10 mg last filled 03/13/2021 for [facility] day [facility]. Metformin 850 mg  last filled 04/10/2021 for [facility] day [facility]. Rybelsus 3 mg last filled  05/08/2021 [facility] day [facility].  [facility]  [id number]: [phone]: [phone] Uses pill box? Yes Pt endorses 100% compliance  We discussed: Current pharmacy is preferred with insurance plan and patient is satisfied with pharmacy services Patient decided to: Continue current medication management strategy  Care Plan and Follow Up Patient Decision:  Patient agrees to Care Plan and Follow-up.  Plan: Telephone follow up appointment with care management team member scheduled for:  06/21/2021 at 10:15 AM  AJunius Argyle PharmD, BPara March CPP Clinical Pharmacist Practitioner  [facility] [phone] Patient Care Plan: General Pharmacy (Adult)     Problem Identified: Hypertension, Hyperlipidemia, Diabetes, Depression, and Chronic Pain   Priority: High     Long-Range Goal: Patient-Specific Goal   Start Date: 05/17/2021  Expected End Date: 05/18/2022  This Visit's Progress: On track  Priority: High  Note:      Current Barriers:  Unable to achieve control of diabetes   Pharmacist Clinical Goal(s):  Patient will achieve control of diabetes as evidenced by A1c less than 7% through collaboration with PharmD and provider.   Interventions: 1:1 collaboration with [name], CCharlene Brooke NP regarding development and update of comprehensive plan of care as evidenced by provider attestation and co-signature Inter-disciplinary care team collaboration (see longitudinal plan of care) Comprehensive medication review performed; medication list updated in electronic medical record  Hyperlipidemia: (LDL goal < 70) -Uncontrolled -Current treatment: Ezetimibe 10 mg daily  Fenofibrate 160 mg daily  Rosuvastatin 10 mg daily  -Medications previously tried: NA  -Has begun cutting down on fast food, upcoming follow-up with  lipid clinic.  -Counseled on diet and exercise extensively Recommended to continue current medication  Diabetes (A1c goal <7%) -Uncontrolled -Current medications: Glipizide XL 5 mg daily: Appropriate, Effective, query safe.  Metformin 850 mg twice daily: Appropriate, Effective, Safe, Accessible  Rybelsus 3 mg daily: Appropriate, Effective, Safe, Accessible  -Medications previously tried: NA  -Current home glucose readings fasting glucose: 114-130s -Denies hypoglycemic/hyperglycemic symptoms -Wants to avoid dialysis  -Current meal patterns: Previously eating fast food twice weekly, cut down after A1c went up.  breakfast: Typically skips lunch: Salad   dinner: Baked chicken OR hamburgers. Chicken salad + ranch.  snacks: Rarely snacks  drinks: Mainly water. No sugar cranberry juice.  -Current exercise: Walks most mornings  -Blood sugars with significant improvement since cutting down on fast food. -Recommend increasing Rybelsus to 7 mg daily  -Recommend STOPPING Glipizide  Asthma (Goal: control symptoms and prevent exacerbations) -Controlled -Current treatment  Ventolin  HFA 1-2 puffs every hours as needed  Symbicort 80-4.5 mcg 2 puffs twice daily -Current treatment  Cetirizine 10 mg nightly  Flonase 2 sprays daily   Saline nasal spray   -Medications previously tried: NA  -Recommended to continue current medication  Depression/Anxiety (Goal: Maintain symptom remission) -Controlled -Current treatment: Citalopram 40 mg daily: Appropriate, Effective, Safe, Accessible  Mirtazapine 30 mg nightly: Appropriate, Effective, Safe, Accessible  -Medications previously tried/failed: NA -Recommended to continue current medication  GERD (Goal: Minimize Heartburn) -Controlled -Current treatment  Famotidine 20 mg daily: Appropriate, Effective, Safe, Accessible Pantoprazole 40 mg daily: Appropriate, Effective, Safe, Accessible  -Medications previously tried: NA -Symptoms in excellent  control, could consider stopping PPI use and seeing if she remains in good control.  -Recommended to continue current medication  Patient Goals/Self-Care Activities Patient will:  - check glucose daily before breakfast, document, and provide at future appointments  Follow Up Plan: ***

## 2021-07-12 ENCOUNTER — Other Ambulatory Visit: Payer: Self-pay | Admitting: Nurse Practitioner

## 2021-07-12 NOTE — Telephone Encounter (Signed)
Chart supports Rx Last OV: 03/2021 Next OV: 06/2021
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
## 2021-07-15 ENCOUNTER — Encounter: Payer: Self-pay | Admitting: Certified Registered Nurse Anesthetist

## 2021-07-16 ENCOUNTER — Ambulatory Visit: Payer: Medicare HMO | Admitting: Internal Medicine

## 2021-07-16 VITALS — BP 134/80 | HR 107 | Ht 67.0 in | Wt 159.0 lb

## 2021-07-16 DIAGNOSIS — E1169 Type 2 diabetes mellitus with other specified complication: Secondary | ICD-10-CM

## 2021-07-16 DIAGNOSIS — E785 Hyperlipidemia, unspecified: Secondary | ICD-10-CM

## 2021-07-16 MED ORDER — ROSUVASTATIN CALCIUM 10 MG PO TABS: 10.0000 mg | ORAL_TABLET | Freq: Every day | ORAL | 3 refills | Status: DC

## 2021-07-19 ENCOUNTER — Ambulatory Visit: Payer: Medicare HMO | Admitting: Nurse Practitioner

## 2021-07-30 ENCOUNTER — Encounter: Payer: Medicare HMO | Admitting: Internal Medicine

## 2021-08-08 DIAGNOSIS — E119 Type 2 diabetes mellitus without complications: Secondary | ICD-10-CM | POA: Diagnosis not present

## 2021-08-08 DIAGNOSIS — Z0100 Encounter for examination of eyes and vision without abnormal findings: Secondary | ICD-10-CM | POA: Diagnosis not present

## 2021-08-08 DIAGNOSIS — H2513 Age-related nuclear cataract, bilateral: Secondary | ICD-10-CM | POA: Diagnosis not present

## 2021-08-08 DIAGNOSIS — H524 Presbyopia: Secondary | ICD-10-CM | POA: Diagnosis not present

## 2021-08-08 DIAGNOSIS — H02421 Myogenic ptosis of right eyelid: Secondary | ICD-10-CM | POA: Diagnosis not present

## 2021-08-08 DIAGNOSIS — H31091 Other chorioretinal scars, right eye: Secondary | ICD-10-CM | POA: Diagnosis not present

## 2021-08-08 LAB — HM DIABETES EYE EXAM

## 2021-08-17 ENCOUNTER — Ambulatory Visit: Payer: Medicare HMO | Admitting: Nurse Practitioner

## 2021-08-24 ENCOUNTER — Other Ambulatory Visit: Payer: Self-pay | Admitting: Nurse Practitioner

## 2021-08-27 NOTE — Telephone Encounter (Signed)
Chart supports Rx Last OV: 03/2021 Next OV: 08/2021

## 2021-08-29 ENCOUNTER — Encounter: Payer: Self-pay | Admitting: Nurse Practitioner

## 2021-08-29 ENCOUNTER — Ambulatory Visit (INDEPENDENT_AMBULATORY_CARE_PROVIDER_SITE_OTHER): Payer: Medicare HMO | Admitting: Nurse Practitioner

## 2021-08-29 VITALS — BP 106/42 | HR 85 | Temp 97.8°F | Ht 67.0 in | Wt 157.6 lb

## 2021-08-29 DIAGNOSIS — I10 Essential (primary) hypertension: Secondary | ICD-10-CM | POA: Diagnosis not present

## 2021-08-29 DIAGNOSIS — E1165 Type 2 diabetes mellitus with hyperglycemia: Secondary | ICD-10-CM

## 2021-08-29 DIAGNOSIS — E1169 Type 2 diabetes mellitus with other specified complication: Secondary | ICD-10-CM | POA: Diagnosis not present

## 2021-08-29 LAB — POCT GLYCOSYLATED HEMOGLOBIN (HGB A1C): Hemoglobin A1C: 7.8 % — AB (ref 4.0–5.6)

## 2021-08-29 NOTE — Assessment & Plan Note (Signed)
Repeat hgbA1c: 7.8 from 8.8% improved Denies any adverse effects with current medications BP Readings from Last 3 Encounters:  08/29/21 (!) 106/42  07/16/21 134/80  05/14/21 112/66   Wt Readings from Last 3 Encounters:  08/29/21 157 lb 9.6 oz (71.5 kg)  07/16/21 159 lb (72.1 kg)  05/14/21 156 lb (70.8 kg)   Maintain med doses: metformin, rybelsus F/up with lipid clinic F/up 34months

## 2021-08-29 NOTE — Patient Instructions (Signed)
Maintain current medications. Continue low fat, low carb/sugar diet.  Diabetes Mellitus and Nutrition, Adult When you have diabetes, or diabetes mellitus, it is very important to have healthy eating habits because your blood sugar (glucose) levels are greatly affected by what you eat and drink. Eating healthy foods in the right amounts, at about the same times every day, can help you: Manage your blood glucose. Lower your risk of heart disease. Improve your blood pressure. Reach or maintain a healthy weight. What can affect my meal plan? Every person with diabetes is different, and each person has different needs for a meal plan. Your health care provider may recommend that you work with a dietitian to make a meal plan that is best for you. Your meal plan may vary depending on factors such as: The calories you need. The medicines you take. Your weight. Your blood glucose, blood pressure, and cholesterol levels. Your activity level. Other health conditions you have, such as heart or kidney disease. How do carbohydrates affect me? Carbohydrates, also called carbs, affect your blood glucose level more than any other type of food. Eating carbs raises the amount of glucose in your blood. It is important to know how many carbs you can safely have in each meal. This is different for every person. Your dietitian can help you calculate how many carbs you should have at each meal and for each snack. How does alcohol affect me? Alcohol can cause a decrease in blood glucose (hypoglycemia), especially if you use insulin or take certain diabetes medicines by mouth. Hypoglycemia can be a life-threatening condition. Symptoms of hypoglycemia, such as sleepiness, dizziness, and confusion, are similar to symptoms of having too much alcohol. Do not drink alcohol if: Your health care provider tells you not to drink. You are pregnant, may be pregnant, or are planning to become pregnant. If you drink  alcohol: Limit how much you have to: 0-1 drink a day for women. 0-2 drinks a day for men. Know how much alcohol is in your drink. In the [place] one drink equals one 12 oz bottle of beer (355 mL), one 5 oz glass of wine (148 mL), or one 1 oz glass of hard liquor (44 mL). Keep yourself hydrated with water, diet soda, or unsweetened iced tea. Keep in mind that regular soda, juice, and other mixers may contain a lot of sugar and must be counted as carbs. What are tips for following this plan?  Reading food labels Start by checking the serving size on the Nutrition Facts label of packaged foods and drinks. The number of calories and the amount of carbs, fats, and other nutrients listed on the label are based on one serving of the item. Many items contain more than one serving per package. Check the total grams (g) of carbs in one serving. Check the number of grams of saturated fats and trans fats in one serving. Choose foods that have a low amount or none of these fats. Check the number of milligrams (mg) of salt (sodium) in one serving. Most people should limit total sodium intake to less than 2,300 mg per day. Always check the nutrition information of foods labeled as "low-fat" or "nonfat." These foods may be higher in added sugar or refined carbs and should be avoided. Talk to your dietitian to identify your daily goals for nutrients listed on the label. Shopping Avoid buying canned, pre-made, or processed foods. These foods tend to be high in fat, sodium, and added sugar. Shop around the  outside edge of the grocery store. This is where you will most often find fresh fruits and vegetables, bulk grains, fresh meats, and fresh dairy products. Cooking Use low-heat cooking methods, such as baking, instead of high-heat cooking methods, such as deep frying. Cook using healthy oils, such as olive, canola, or sunflower oil. Avoid cooking with butter, cream, or high-fat meats. Meal planning Eat meals and  snacks regularly, preferably at the same times every day. Avoid going long periods of time without eating. Eat foods that are high in fiber, such as fresh fruits, vegetables, beans, and whole grains. Eat 4-6 oz (112-168 g) of lean protein each day, such as lean meat, chicken, fish, eggs, or tofu. One ounce (oz) (28 g) of lean protein is equal to: 1 oz (28 g) of meat, chicken, or fish. 1 egg.  cup (62 g) of tofu. Eat some foods each day that contain healthy fats, such as avocado, nuts, seeds, and fish. What foods should I eat? Fruits Berries. Apples. Oranges. Peaches. Apricots. Plums. Grapes. Mangoes. Papayas. Pomegranates. Kiwi. Cherries. Vegetables Leafy greens, including lettuce, spinach, kale, chard, collard greens, mustard greens, and cabbage. Beets. Cauliflower. Broccoli. Carrots. Green beans. Tomatoes. Peppers. Onions. Cucumbers. Brussels sprouts. Grains Whole grains, such as whole-wheat or whole-grain bread, crackers, tortillas, cereal, and pasta. Unsweetened oatmeal. Quinoa. Brown or wild rice. Meats and other proteins Seafood. Poultry without skin. Lean cuts of poultry and beef. Tofu. Nuts. Seeds. Dairy Low-fat or fat-free dairy products such as milk, yogurt, and cheese. The items listed above may not be a complete list of foods and beverages you can eat and drink. Contact a dietitian for more information. What foods should I avoid? Fruits Fruits canned with syrup. Vegetables Canned vegetables. Frozen vegetables with butter or cream sauce. Grains Refined white flour and flour products such as bread, pasta, snack foods, and cereals. Avoid all processed foods. Meats and other proteins Fatty cuts of meat. Poultry with skin. Breaded or fried meats. Processed meat. Avoid saturated fats. Dairy Full-fat yogurt, cheese, or milk. Beverages Sweetened drinks, such as soda or iced tea. The items listed above may not be a complete list of foods and beverages you should avoid. Contact a  dietitian for more information. Questions to ask a health care provider Do I need to meet with a certified diabetes care and education specialist? Do I need to meet with a dietitian? What number can I call if I have questions? When are the best times to check my blood glucose? Where to find more information: American Diabetes Association: diabetes.org Academy of Nutrition and Dietetics: eatright.[employer] of Diabetes and Digestive and Kidney Diseases: [employer]is Association of Diabetes Care & Education Specialists: diabeteseducator.org Summary It is important to have healthy eating habits because your blood sugar (glucose) levels are greatly affected by what you eat and drink. It is important to use alcohol carefully. A healthy meal plan will help you manage your blood glucose and lower your risk of heart disease. Your health care provider may recommend that you work with a dietitian to make a meal plan that is best for you. This information is not intended to replace advice given to you by your health care provider. Make sure you discuss any questions you have with your health care provider. Document Revised: 08/11/2019 Document Reviewed: 08/11/2019 Elsevier Patient Education  [place].

## 2021-08-29 NOTE — Progress Notes (Signed)
Established Patient Visit  Patient: Audrey Russell   DOB: [birth year]   66 y.o. Female  MRN: [id number] Visit Date: 08/29/2021  Subjective:    Chief Complaint  Patient presents with   Office Visit    HTN/ DM /Hyperlipidemia F/u Checks BS daily, but doesn't check BP  No concerns    HPI DM (diabetes mellitus) ([place]) Repeat hgbA1c: 7.8 from 8.8% improved Denies any adverse effects with current medications BP Readings from Last 3 Encounters:  08/29/21 (!) 106/42  07/16/21 134/80  05/14/21 112/66   Wt Readings from Last 3 Encounters:  08/29/21 157 lb 9.6 oz (71.5 kg)  07/16/21 159 lb (72.1 kg)  05/14/21 156 lb (70.8 kg)   Maintain med doses: metformin, rybelsus F/up with lipid clinic F/up 44month   Reviewed medical, surgical, and social history today  Medications: Outpatient Medications Prior to Visit  Medication Sig   acetaminophen (TYLENOL) 500 MG tablet Take 1,000 mg by mouth every 6 (six) hours as needed for mild pain or moderate pain.   albuterol (VENTOLIN HFA) 108 (90 Base) MCG/ACT inhaler INHALE 1-2 PUFFS INTO THE LUNGS EVERY 6 (SIX) HOURS AS NEEDED FOR WHEEZING OR SHORTNESS OF BREATH.   aspirin EC 81 MG tablet Take 1 tablet (81 mg total) by mouth daily.   blood glucose meter kit and supplies One Touch Contour Monitor. Check glucose in AM, before breakfast. E11.65   budesonide-formoterol (SYMBICORT) 80-4.5 MCG/ACT inhaler INHALE 2 PUFFS INTO THE LUNGS DAILY. RINSE MOUTH AFTER EACH USE   cetirizine (ZYRTEC) 10 MG tablet Take 1 tablet (10 mg total) by mouth at bedtime.   citalopram (CELEXA) 40 MG tablet TAKE 1 TABLET BY MOUTH EVERY DAY   ezetimibe (ZETIA) 10 MG tablet TAKE 1 TABLET BY MOUTH EVERY DAY IN THE EVENING   famotidine (PEPCID) 20 MG tablet TAKE 1 TABLET BY MOUTH EVERY DAY   fenofibrate 160 MG tablet TAKE 1 TABLET BY MOUTH EVERY DAY   fluticasone (FLONASE) 50 MCG/ACT nasal spray SPRAY 2 SPRAYS INTO EACH NOSTRIL EVERY DAY   glucose blood  (CONTOUR NEXT TEST) test strip 1 each by Other route as needed for other. Use as instructed   glucose blood (CONTOUR NEXT TEST) test strip Check glucose before breakfast. E11.65   metFORMIN (GLUCOPHAGE) 850 MG tablet Take 1 tablet (850 mg total) by mouth 2 (two) times daily with a meal.   mirtazapine (REMERON) 30 MG tablet TAKE 1 TABLET BY MOUTH AT BEDTIME.   ONETOUCH ULTRA test strip USE TO CHECK BLOOD GLUCOSE IN AM BEFORE BREAKFAST   pantoprazole (PROTONIX) 40 MG tablet TAKE 1 TABLET (40 MG TOTAL) BY MOUTH DAILY. TAKE 30-60 MIN BEFORE FIRST MEAL OF THE DAY   rosuvastatin (CRESTOR) 10 MG tablet Take 1 tablet (10 mg total) by mouth daily.   Semaglutide (RYBELSUS) 7 MG TABS Take 7 mg by mouth daily.   sodium chloride (OCEAN) 0.65 % SOLN nasal spray Place 1 spray into both nostrils as needed for congestion.   tiZANidine (ZANAFLEX) 4 MG tablet Take 1 tablet (4 mg total) by mouth every 6 (six) hours as needed for muscle spasms.   No facility-administered medications prior to visit.   Reviewed past medical and social history.   ROS per HPI above      Objective:  BP (!) 106/42 (BP Location: Right Arm, Patient Position: Sitting, Cuff Size: Small)   Pulse 85   Temp 97.8 F (36.6  C) (Temporal)   Ht 5' 7" (1.702 m)   Wt 157 lb 9.6 oz (71.5 kg)   SpO2 97%   BMI 24.68 kg/m      Physical Exam Cardiovascular:     Rate and Rhythm: Normal rate.     Pulses: Normal pulses.  Pulmonary:     Effort: Pulmonary effort is normal.  Musculoskeletal:     Right lower leg: No edema.     Left lower leg: No edema.  Neurological:     Mental Status: She is alert and oriented to person, place, and time.     Results for orders placed or performed in visit on 08/29/21  POCT glycosylated hemoglobin (Hb A1C)  Result Value Ref Range   Hemoglobin A1C 7.8 (A) 4.0 - 5.6 %      Assessment & Plan:    Problem List Items Addressed This Visit       Cardiovascular and Mediastinum   Essential hypertension      Endocrine   DM (diabetes mellitus) ([place]) - Primary    Repeat hgbA1c: 7.8 from 8.8% improved Denies any adverse effects with current medications BP Readings from Last 3 Encounters:  08/29/21 (!) 106/42  07/16/21 134/80  05/14/21 112/66   Wt Readings from Last 3 Encounters:  08/29/21 157 lb 9.6 oz (71.5 kg)  07/16/21 159 lb (72.1 kg)  05/14/21 156 lb (70.8 kg)   Maintain med doses: metformin, rybelsus F/up with lipid clinic F/up 17month      Relevant Orders   POCT glycosylated hemoglobin (Hb A1C) (Completed)   Return in about 3 months (around 11/29/2021) for DM, HTN, hyperlipidemia (fasting).     CWilfred Lacy NP

## 2021-09-01 ENCOUNTER — Other Ambulatory Visit: Payer: Self-pay | Admitting: Nurse Practitioner

## 2021-09-01 DIAGNOSIS — E781 Pure hyperglyceridemia: Secondary | ICD-10-CM

## 2021-09-03 NOTE — Telephone Encounter (Signed)
Chart supports Rx Last OV: 08/2021 Next OV: 04/2022

## 2021-10-01 ENCOUNTER — Other Ambulatory Visit: Payer: Medicare HMO

## 2021-10-06 ENCOUNTER — Other Ambulatory Visit: Payer: Self-pay | Admitting: Nurse Practitioner

## 2021-10-08 ENCOUNTER — Ambulatory Visit
Admission: RE | Admit: 2021-10-08 | Discharge: 2021-10-08 | Disposition: A | Discharge status: Home / Self Care | Payer: Medicare HMO | Source: Ambulatory Visit | Attending: Nurse Practitioner | Admitting: Nurse Practitioner

## 2021-10-08 DIAGNOSIS — M8589 Other specified disorders of bone density and structure, multiple sites: Secondary | ICD-10-CM | POA: Diagnosis not present

## 2021-10-08 DIAGNOSIS — Z780 Asymptomatic menopausal state: Secondary | ICD-10-CM | POA: Diagnosis not present

## 2021-10-08 DIAGNOSIS — Z1231 Encounter for screening mammogram for malignant neoplasm of breast: Secondary | ICD-10-CM

## 2021-10-08 NOTE — Telephone Encounter (Signed)
Chart supports Rx Last OV: 08/2021 Next OV: 04/2022

## 2021-11-02 ENCOUNTER — Telehealth: Payer: Self-pay

## 2021-11-02 NOTE — Progress Notes (Signed)
Chronic Care Management Pharmacy Assistant   Name: Audrey Russell  MRN: [id number] DOB: 02/17/1955  Reason for Encounter: Medication Review/General Adherence Call.   Recent office visits:  08/29/2021 [name] [name] NP (PCP) No medication Changes noted,Return in about 3 months  Recent consult visits:  07/16/2021 Dr. Ross <D (Cardiology) No Medication Changes noted  Hospital visits:  None in previous 6 months  Medications: Outpatient Encounter Medications as of 11/02/2021  Medication Sig   famotidine (PEPCID) 20 MG tablet TAKE 1 TABLET BY MOUTH EVERY DAY   acetaminophen (TYLENOL) 500 MG tablet Take 1,000 mg by mouth every 6 (six) hours as needed for mild pain or moderate pain.   albuterol (VENTOLIN HFA) 108 (90 Base) MCG/ACT inhaler INHALE 1-2 PUFFS INTO THE LUNGS EVERY 6 (SIX) HOURS AS NEEDED FOR WHEEZING OR SHORTNESS OF BREATH.   aspirin EC 81 MG tablet Take 1 tablet (81 mg total) by mouth daily.   blood glucose meter kit and supplies One Touch Contour Monitor. Check glucose in AM, before breakfast. E11.65   budesonide-formoterol (SYMBICORT) 80-4.5 MCG/ACT inhaler INHALE 2 PUFFS INTO THE LUNGS DAILY. RINSE MOUTH AFTER EACH USE   cetirizine (ZYRTEC) 10 MG tablet Take 1 tablet (10 mg total) by mouth at bedtime.   citalopram (CELEXA) 40 MG tablet TAKE 1 TABLET BY MOUTH EVERY DAY   ezetimibe (ZETIA) 10 MG tablet TAKE 1 TABLET BY MOUTH EVERY DAY IN THE EVENING   fenofibrate 160 MG tablet TAKE 1 TABLET BY MOUTH EVERY DAY   fluticasone (FLONASE) 50 MCG/ACT nasal spray SPRAY 2 SPRAYS INTO EACH NOSTRIL EVERY DAY   glucose blood (CONTOUR NEXT TEST) test strip 1 each by Other route as needed for other. Use as instructed   glucose blood (CONTOUR NEXT TEST) test strip Check glucose before breakfast. E11.65   metFORMIN (GLUCOPHAGE) 850 MG tablet Take 1 tablet (850 mg total) by mouth 2 (two) times daily with a meal.   mirtazapine (REMERON) 30 MG tablet TAKE 1 TABLET BY MOUTH AT  BEDTIME.   ONETOUCH ULTRA test strip USE TO CHECK BLOOD GLUCOSE IN AM BEFORE BREAKFAST   pantoprazole (PROTONIX) 40 MG tablet TAKE 1 TABLET (40 MG TOTAL) BY MOUTH DAILY. TAKE 30-60 MIN BEFORE FIRST MEAL OF THE DAY   rosuvastatin (CRESTOR) 10 MG tablet Take 1 tablet (10 mg total) by mouth daily.   Semaglutide (RYBELSUS) 7 MG TABS Take 7 mg by mouth daily.   sodium chloride (OCEAN) 0.65 % SOLN nasal spray Place 1 spray into both nostrils as needed for congestion.   tiZANidine (ZANAFLEX) 4 MG tablet Take 1 tablet (4 mg total) by mouth every 6 (six) hours as needed for muscle spasms.   No facility-administered encounter medications on file as of 11/02/2021.    Care Gaps: COVID-19 Vaccine Colonoscopy  Foot Exam   Star Rating Drugs: Rosuvastatin 10 mg last filled 09/02/2021 for 90 day supply at [facility]. Metformin 850 mg  last filled 10/14/2021 for [facility] day supply at [facility]. Rybelsus 3 mg last filled 10/01/2021 for [facility] day supply at [facility].   Medication Fill Gaps: None ID   I have attempted without success to contact this patient by phone three times to do her General adherence call. I left a Voice message for patient to return my call.  Bessie Kellihan,CPA Clinical Pharmacist Assistant [phone]   

## 2021-11-13 ENCOUNTER — Ambulatory Visit: Payer: Medicare HMO | Admitting: Internal Medicine

## 2021-11-18 NOTE — Progress Notes (Unsigned)
Subjective:     Patient ID: Audrey Russell, female   DOB: 28-Nov-1955     MRN: [id number]    Brief patient profile:  68 yobf from [place]  quit smoking 2019 moved to [place] 2016 with cough/doe x fall   2017referred to pulmonary clinic 06/26/2017 by Dr   Lorayne Marek with wt loss, night sweats and cavitary lesion in RUL post segment.    06/26/2017 [facility]st [facility] office visit/ Audrey Russell   Chief Complaint  Patient presents with   Pulmonary Consult    Referred by Dr. Wilfred Lacy for eval of abnormal ct chest done 06/12/17.  She states she has been having SOB for the past 1-2 yrs. She states she "coughs constantly"- started back in Sept 2018 but has been getting progressively worse. Her cough is occ prod with clear to yellow sputum.    initially started with cough fall 2017 thick yellow / green phlegm no better with abx and never bloody worse in early am and up to a small cup in vol in am reported   Doe= MMRC3 = can't walk 100 yards even at a slow pace at a flat grade s stopping due to sob   Rec Pantoprazole (protonix) 40 mg   Take  30-60 min before first meal of the day and Pepcid (famotidine)  20 mg one after supper until return to office GERD diet reviewed, bed blocks  Only use your albuterol as a rescue medication   Please schedule a follow up office visit in 4 weeks, sooner if needed  with all medications /inhalers/ solutions in hand   03/01/2021  [name]   maint on saba no ppi  Chief Complaint  Patient presents with   Acute Visit    Increased DOE and cough since Oct 2022- non prod.    Dyspnea:  walks dog daily / [employer] parking ; Cough: first thing in am rattling / min mucoid assoc with nasal congestion/ prior f/b [place] Sleeping: L side down,  bed is flat 4 x  SABA use: 4 x daily with baseline hfa < 50%  02: none  Covid status:   vax x 2  Rec Pantoprazole (protonix) 40 mg   Take  30-60 min before first meal of the day and Pepcid (famotidine)   20 mg after supper until return to office  GERD diet reviewed, bed blocks rec  Prednisone 10 mg take  4 each am x 2 days,   2 each am x 2 days,  1 each am x 2 days and stop  Augmentin 875 mg take one pill twice daily  X 10 days Plan A = Automatic = Always=    Symbicort 80 Take 2 puffs first thing in am and then another 2 puffs about 12 hours later.   Work on inhaler technique:    Plan B = Backup (to supplement plan A, not to replace it) Only use your albuterol inhaler as a rescue medication  If this is working call for alternative to symbicort  Please schedule a follow up office visit in 6 weeks, call sooner if needed - bring inhalers    05/14/2021  f/u ov/Audrey Russell re: AB maint on symbicort 80 2bid   Chief Complaint  Patient presents with   Follow-up    Breathing back to her normal baseline. No new co's. She uses her albuterol inhaler maybe once per day.   Dyspnea:  better walking neighborhood  Cough: none Sleeping: flat bed/ pillows  SABA use: maybe once a day "after steps" 02: none  Covid status:   vax x 2  Rec No change on your medications Work on inhaler technique:   Only use your albuterol as a rescue medication Ok to try albuterol 15 min before an activity (on alternating days)  that you know would usually make you short of breath    11/19/2021  f/u ov/Audrey Russell re: AB   maint on symbicort 80 2bid x 2 weeks worse  No chief complaint on file.   Dyspnea:  still walking nbood, slow pace  Cough: yellow wors in ams Sleeping: 45 degrees with pillows SABA use: 3 x since sick 02: none  Covid status:   vax 3rd complete 2 weeks  Lung cancer screening :  being followed  by [facility]    No obvious day to day or daytime variability or assoc excess/ purulent sputum or mucus plugs or hemoptysis or cp or chest tightness, subjective wheeze or overt sinus or hb symptoms.   *** without nocturnal  or early am exacerbation  of respiratory  c/o's or need for noct saba. Also denies any obvious  fluctuation of symptoms with weather or environmental changes or other aggravating or alleviating factors except as outlined above   No unusual exposure hx or h/o childhood pna/ asthma or knowledge of premature birth.  Current Allergies, Complete Past Medical History, Past Surgical History, Family History, and Social History were reviewed in [employer] record.  ROS  The following are not active complaints unless bolded Hoarseness, sore throat, dysphagia, dental problems, itching, sneezing,  nasal congestion or discharge of excess mucus or purulent secretions, ear ache,   fever, chills, sweats, unintended wt loss or wt gain, classically pleuritic or exertional cp,  orthopnea pnd or arm/hand swelling  or leg swelling, presyncope, palpitations, abdominal pain, anorexia, nausea, vomiting, diarrhea  or change in bowel habits or change in bladder habits, change in stools or change in urine, dysuria, hematuria,  rash, arthralgias, visual complaints, headache, numbness, weakness or ataxia or problems with walking or coordination,  change in mood or  memory.        No outpatient medications have been marked as taking for the 11/19/21 encounter (Appointment) with Tanda Rockers, MD.                 Objective:   Physical Exam   Wts  11/19/2021      ***  05/14/2021       156  03/01/2021         155   06/26/17 164 lb (74.4 kg)  05/07/17 165 lb (74.8 kg)  05/06/17 164 lb 6.4 oz (74.6 kg)    Vital signs reviewed  11/19/2021  - Note at rest 02 sats  ***% on ***   General appearance:    ***      Mild barr***               Assessment:

## 2021-11-19 ENCOUNTER — Ambulatory Visit (INDEPENDENT_AMBULATORY_CARE_PROVIDER_SITE_OTHER): Payer: Medicare HMO | Admitting: Internal Medicine

## 2021-11-19 ENCOUNTER — Encounter: Payer: Self-pay | Admitting: Internal Medicine

## 2021-11-19 DIAGNOSIS — J4489 Other specified chronic obstructive pulmonary disease: Secondary | ICD-10-CM | POA: Diagnosis not present

## 2021-11-19 MED ORDER — AMOXICILLIN-POT CLAVULANATE 875-125 MG PO TABS: 1.0000 | ORAL_TABLET | Freq: Two times a day (BID) | ORAL | 0 refills | Status: DC

## 2021-11-19 MED ORDER — PREDNISONE 10 MG PO TABS: ORAL_TABLET | ORAL | 0 refills | Status: DC

## 2021-11-19 NOTE — Patient Instructions (Addendum)
Try prilosec otc 20mg   Take 30-60 min before first meal of the day and Pepcid ac (famotidine) 20 mg one after supper until cough is completely gone for at least a week without the need for cough suppression  For cough > mucinex dm 1200 mg every 12 hours as needed       Augmentin 875 mg take one pill twice daily  X 10 days - take at breakfast and supper with large glass of water.  It would help reduce the usual side effects (diarrhea and yeast infections) if you ate cultured yogurt at lunch.   Prednisone 10 mg take  4 each am x 2 days,   2 each am x 2 days,  1 each am x 2 days and stop   Only use your albuterol as a rescue medication to be used if you can't catch your breath by resting or doing a relaxed purse lip breathing pattern.  - The less you use it, the better it will work when you need it. - Ok to use up to 2 puffs  every 4 hours if you must but call for immediate appointment if use goes up over your usual need - Don't leave home without it !!  (think of it like the spare tire for your car)   Please schedule a follow up visit in 12  months but call sooner if needed

## 2021-11-20 ENCOUNTER — Encounter: Payer: Self-pay | Admitting: Internal Medicine

## 2021-11-20 NOTE — Assessment & Plan Note (Signed)
Stopped smoking 2019  - -  pfts 07/22/17 >  Nl x dlco 37 corrects to 50 - 03/01/2021  continue symbicort 80 2bid and added max rx for gerd > resolved  - Allergy profile 03/01/21  >  Eos 0.1 /  IgE  10   - 11/19/2021  After extensive coaching inhaler device,  effectiveness =    75%   Mild flare assoc with uri/rhinitis/ ? Sinusitis/ bronchitis > rec augmentinx 10 da, Prednisone 10 mg take  4 each am x 2 days,   2 each am x 2 days,  1 each am x 2 days and stop and gerd rx otc just while coughing with mucinex dm prn   If back to baseline to f/y yearly and prn in meantime         Each maintenance medication was reviewed in detail including emphasizing most importantly the difference between maintenance and prns and under what circumstances the prns are to be triggered using an action plan format where appropriate.  Total time for H and P, chart review, counseling, reviewing hfa device(s) and generating customized AVS unique to this office visit / same day charting = 21 min

## 2021-12-04 ENCOUNTER — Ambulatory Visit: Payer: Medicare HMO | Admitting: Internal Medicine

## 2021-12-04 ENCOUNTER — Other Ambulatory Visit: Payer: Self-pay | Admitting: Nurse Practitioner

## 2021-12-04 DIAGNOSIS — H6993 Unspecified Eustachian tube disorder, bilateral: Secondary | ICD-10-CM

## 2021-12-04 DIAGNOSIS — F325 Major depressive disorder, single episode, in full remission: Secondary | ICD-10-CM

## 2021-12-04 DIAGNOSIS — E1165 Type 2 diabetes mellitus with hyperglycemia: Secondary | ICD-10-CM

## 2021-12-05 NOTE — Telephone Encounter (Signed)
Chart supports Rx Last OV: 08/2021 Next OV: 04/2022

## 2022-02-02 ENCOUNTER — Other Ambulatory Visit: Payer: Self-pay | Admitting: Nurse Practitioner

## 2022-02-02 DIAGNOSIS — E1165 Type 2 diabetes mellitus with hyperglycemia: Secondary | ICD-10-CM

## 2022-02-02 DIAGNOSIS — F325 Major depressive disorder, single episode, in full remission: Secondary | ICD-10-CM

## 2022-02-04 NOTE — Telephone Encounter (Signed)
Chart supports Rx Last OV: 08/2021 Next OV: 04/2022

## 2022-02-06 ENCOUNTER — Telehealth: Payer: Self-pay

## 2022-02-06 NOTE — Progress Notes (Signed)
Care Management & Coordination Services Pharmacy Team  Reason for Encounter: General adherence update   Contacted patient on 02/06/2022 for general disease state and medication adherence call.   Recent office visits:  None ID  Recent consult visits:  11/19/2021 Dr. Sherene Sires MD (Pulmonary) Start AUGMENTIN [id number] 1961 MG tablet X 10 days, Start Prednisone 10 mg, Try prilosec otc 20mg  Take 30-60 min before first meal of the day and  Pepcid ac (famotidine) 20 mg one after supper until cough is completely gone for at least a week without the need for cough suppression   Hospital visits:  None in previous 6 months  Medications: Outpatient Encounter Medications as of 02/06/2022  Medication Sig   acetaminophen (TYLENOL) 500 MG tablet Take 1,000 mg by mouth every 6 (six) hours as needed for mild pain or moderate pain.   albuterol (VENTOLIN HFA) 108 (90 Base) MCG/ACT inhaler INHALE 1-2 PUFFS INTO THE LUNGS EVERY 6 (SIX) HOURS AS NEEDED FOR WHEEZING OR SHORTNESS OF BREATH.   amoxicillin-clavulanate (AUGMENTIN) 875-125 MG tablet Take 1 tablet by mouth 2 (two) times daily.   aspirin EC 81 MG tablet Take 1 tablet (81 mg total) by mouth daily.   blood glucose meter kit and supplies One Touch Contour Monitor. Check glucose in AM, before breakfast. E11.65   budesonide-formoterol (SYMBICORT) 80-4.5 MCG/ACT inhaler INHALE 2 PUFFS INTO THE LUNGS DAILY. RINSE MOUTH AFTER EACH USE   cetirizine (ZYRTEC) 10 MG tablet Take 1 tablet (10 mg total) by mouth at bedtime.   citalopram (CELEXA) 40 MG tablet TAKE 1 TABLET BY MOUTH EVERY DAY   ezetimibe (ZETIA) 10 MG tablet TAKE 1 TABLET BY MOUTH EVERY DAY IN THE EVENING   famotidine (PEPCID) 20 MG tablet TAKE 1 TABLET BY MOUTH EVERY DAY   fenofibrate 160 MG tablet TAKE 1 TABLET BY MOUTH EVERY DAY   fluticasone (FLONASE) 50 MCG/ACT nasal spray SPRAY 2 SPRAYS INTO EACH NOSTRIL EVERY DAY   glucose blood (CONTOUR NEXT TEST) test strip 1 each by Other route as needed for other. Use  as instructed   glucose blood (CONTOUR NEXT TEST) test strip Check glucose before breakfast. E11.65   metFORMIN (GLUCOPHAGE) 850 MG tablet Take 1 tablet (850 mg total) by mouth 2 (two) times daily with a meal.   mirtazapine (REMERON) 30 MG tablet TAKE 1 TABLET BY MOUTH EVERYDAY AT BEDTIME   ONETOUCH ULTRA test strip USE TO CHECK BLOOD GLUCOSE IN AM BEFORE BREAKFAST   pantoprazole (PROTONIX) 40 MG tablet TAKE 1 TABLET (40 MG TOTAL) BY MOUTH DAILY. TAKE 30-60 MIN BEFORE FIRST MEAL OF THE DAY   predniSONE (DELTASONE) 10 MG tablet Take  4 each am x 2 days,   2 each am x 2 days,  1 each am x 2 days and stop   rosuvastatin (CRESTOR) 10 MG tablet Take 1 tablet (10 mg total) by mouth daily.   Semaglutide (RYBELSUS) 7 MG TABS Take 7 mg by mouth daily.   sodium chloride (OCEAN) 0.65 % SOLN nasal spray Place 1 spray into both nostrils as needed for congestion.   tiZANidine (ZANAFLEX) 4 MG tablet Take 1 tablet (4 mg total) by mouth every 6 (six) hours as needed for muscle spasms.   No facility-administered encounter medications on file as of 02/06/2022.    Recent vitals BP Readings from Last 3 Encounters:  11/19/21 110/62  08/29/21 (!) 106/42  07/16/21 134/80   Pulse Readings from Last 3 Encounters:  11/19/21 84  08/29/21 85  07/16/21 (!) 107  Wt Readings from Last 3 Encounters:  11/19/21 154 lb 9.6 oz (70.1 kg)  08/29/21 157 lb 9.6 oz (71.5 kg)  07/16/21 159 lb (72.1 kg)   BMI Readings from Last 3 Encounters:  11/19/21 24.21 kg/m  08/29/21 24.68 kg/m  07/16/21 24.90 kg/m    Recent lab results    Component Value Date/Time   NA 138 02/23/2021 0833   NA 141 09/24/2018 1214   K 4.2 02/23/2021 0833   CL 106 02/23/2021 0833   CO2 26 02/23/2021 0833   GLUCOSE 198 (H) 02/23/2021 0833   BUN 10 02/23/2021 0833   BUN 9 09/24/2018 1214   CREATININE 0.79 02/23/2021 0833   CREATININE 0.90 02/18/2020 1450   CALCIUM 9.5 02/23/2021 0833    Lab Results  Component Value Date   CREATININE  0.79 02/23/2021   GFR 72.17 11/24/2020   GFRNONAA >60 02/23/2021   GFRAA >60 05/07/2019   Lab Results  Component Value Date/Time   HGBA1C 7.8 (A) 08/29/2021 11:01 AM   HGBA1C 8.8 (H) 04/26/2021 09:53 AM   HGBA1C 7.6 (H) 11/24/2020 10:43 AM   MICROALBUR 6.6 (H) 04/18/2021 02:12 PM   MICROALBUR 0.9 02/18/2020 02:50 PM    Lab Results  Component Value Date   CHOL 151 04/26/2021   HDL 36.20 (L) 04/26/2021   LDLCALC 87 04/26/2021   LDLDIRECT 73.0 08/17/2020   TRIG 136.0 04/26/2021   CHOLHDL 4 04/26/2021     The patient has not had an ED visit since last contact.   I have attempted without success to contact this patient by phone three times to do her general State call. I left a Voice message for patient to return my call.  Care Gaps: Annual wellness visit in last year? Yes, Last AWV was on 04/24/2021.  If Diabetic: Last eye exam / retinopathy screening:Yes, last eye exam was completed on 08/08/2021. Last diabetic foot exam:Last foot exam was on 08/17/2020. Last UACR: Last UACR was 02/23/2021.  Star Rating Drugs:  Medication:Rosuvastatin 10 mg Last Fill: 12/03/2021 Day Supply [facility] day supply at [facility]  Medication:Metformin 850 mg Last Fill: 10/14/2021 Day Supply [facility] day supply at [facility]  Medication: Rybelsus 3 mg Last Fill: 01/07/2022 Day Supply 90 day supply at [facility]   Care Gaps: COVID-19 Vaccine Colonoscopy  Foot Exam Shingrix Vaccine  Everlean Cherry Clinical Pharmacist Assistant [phone]

## 2022-02-22 ENCOUNTER — Ambulatory Visit (HOSPITAL_COMMUNITY)
Admission: RE | Admit: 2022-02-22 | Discharge: 2022-02-22 | Disposition: A | Discharge status: Home / Self Care | Payer: Medicare HMO | Source: Ambulatory Visit | Attending: Internal Medicine | Admitting: Internal Medicine

## 2022-02-22 ENCOUNTER — Inpatient Hospital Stay: Payer: Medicare HMO | Attending: Internal Medicine

## 2022-02-22 ENCOUNTER — Other Ambulatory Visit: Payer: Self-pay | Admitting: Nurse Practitioner

## 2022-02-22 DIAGNOSIS — J439 Emphysema, unspecified: Secondary | ICD-10-CM | POA: Diagnosis not present

## 2022-02-22 DIAGNOSIS — C3491 Malignant neoplasm of unspecified part of right bronchus or lung: Secondary | ICD-10-CM | POA: Diagnosis not present

## 2022-02-22 DIAGNOSIS — C3490 Malignant neoplasm of unspecified part of unspecified bronchus or lung: Secondary | ICD-10-CM | POA: Insufficient documentation

## 2022-02-22 DIAGNOSIS — J449 Chronic obstructive pulmonary disease, unspecified: Secondary | ICD-10-CM

## 2022-02-22 LAB — CBC WITH DIFFERENTIAL (CANCER CENTER ONLY)
Abs Immature Granulocytes: 0.03 K/uL (ref 0.00–0.07)
Basophils Absolute: 0.1 K/uL (ref 0.0–0.1)
Basophils Relative: 1 %
Eosinophils Absolute: 0.1 K/uL (ref 0.0–0.5)
Eosinophils Relative: 1 %
HCT: 43.6 % (ref 36.0–46.0)
Hemoglobin: 14.8 g/dL (ref 12.0–15.0)
Immature Granulocytes: 0 %
Lymphocytes Relative: 32 %
Lymphs Abs: 2.5 K/uL (ref 0.7–4.0)
MCH: 28.5 pg (ref 26.0–34.0)
MCHC: 33.9 g/dL (ref 30.0–36.0)
MCV: 83.8 fL (ref 80.0–100.0)
Monocytes Absolute: 0.5 K/uL (ref 0.1–1.0)
Monocytes Relative: 6 %
Neutro Abs: 4.8 K/uL (ref 1.7–7.7)
Neutrophils Relative %: 60 %
Platelet Count: 247 K/uL (ref 150–400)
RBC: 5.2 MIL/uL — ABNORMAL HIGH (ref 3.87–5.11)
RDW: 13.8 % (ref 11.5–15.5)
WBC Count: 8 K/uL (ref 4.0–10.5)
nRBC: 0 % (ref 0.0–0.2)

## 2022-02-22 LAB — CMP (CANCER CENTER ONLY)
ALT: 11 U/L (ref 0–44)
AST: 20 U/L (ref 15–41)
Albumin: 4.1 g/dL (ref 3.5–5.0)
Alkaline Phosphatase: 61 U/L (ref 38–126)
Anion gap: 7 (ref 5–15)
BUN: 9 mg/dL (ref 8–23)
CO2: 25 mmol/L (ref 22–32)
Calcium: 9.6 mg/dL (ref 8.9–10.3)
Chloride: 109 mmol/L (ref 98–111)
Creatinine: 0.86 mg/dL (ref 0.44–1.00)
GFR, Estimated: >60 mL/min
Glucose, Bld: 130 mg/dL — ABNORMAL HIGH (ref 70–99)
Potassium: 4.1 mmol/L (ref 3.5–5.1)
Sodium: 141 mmol/L (ref 135–145)
Total Bilirubin: 0.4 mg/dL (ref 0.3–1.2)
Total Protein: 7 g/dL (ref 6.5–8.1)

## 2022-02-22 MED ORDER — IOHEXOL 300 MG/ML  SOLN
75.0000 mL | Freq: Once | INTRAMUSCULAR | Status: AC | PRN
  Administered 2022-02-22: 75 mL via INTRAVENOUS

## 2022-02-22 MED ORDER — SODIUM CHLORIDE (PF) 0.9 % IJ SOLN
INTRAMUSCULAR | Status: AC
  Filled 2022-02-22: qty 50

## 2022-02-26 ENCOUNTER — Inpatient Hospital Stay: Payer: Medicare HMO | Admitting: Internal Medicine

## 2022-02-27 ENCOUNTER — Telehealth: Payer: Self-pay | Admitting: Nurse Practitioner

## 2022-02-27 ENCOUNTER — Other Ambulatory Visit: Payer: Self-pay | Admitting: Nurse Practitioner

## 2022-02-27 DIAGNOSIS — J449 Chronic obstructive pulmonary disease, unspecified: Secondary | ICD-10-CM

## 2022-02-27 MED ORDER — ALBUTEROL SULFATE HFA 108 (90 BASE) MCG/ACT IN AERS: INHALATION_SPRAY | RESPIRATORY_TRACT | 0 refills | Status: DC

## 2022-02-27 NOTE — Telephone Encounter (Signed)
This was sent in on 02/22/22 resend to pharmacy

## 2022-02-27 NOTE — Telephone Encounter (Signed)
Pt called and stated that when she went to pick up albuterol (VENTOLIN HFA) 108 (90  that the pharmacy stated they are still waiting on the authorization from you

## 2022-03-01 ENCOUNTER — Telehealth: Payer: Self-pay

## 2022-03-01 ENCOUNTER — Encounter: Payer: Self-pay | Admitting: Nurse Practitioner

## 2022-03-01 ENCOUNTER — Ambulatory Visit (INDEPENDENT_AMBULATORY_CARE_PROVIDER_SITE_OTHER): Payer: Medicare HMO | Admitting: Nurse Practitioner

## 2022-03-01 VITALS — BP 112/70 | HR 67 | Temp 97.0°F | Resp 16 | Ht 67.0 in | Wt 154.0 lb

## 2022-03-01 DIAGNOSIS — F3170 Bipolar disorder, currently in remission, most recent episode unspecified: Secondary | ICD-10-CM | POA: Diagnosis not present

## 2022-03-01 DIAGNOSIS — E1142 Type 2 diabetes mellitus with diabetic polyneuropathy: Secondary | ICD-10-CM | POA: Diagnosis not present

## 2022-03-01 DIAGNOSIS — R69 Illness, unspecified: Secondary | ICD-10-CM | POA: Diagnosis not present

## 2022-03-01 DIAGNOSIS — E1165 Type 2 diabetes mellitus with hyperglycemia: Secondary | ICD-10-CM

## 2022-03-01 LAB — POCT GLYCOSYLATED HEMOGLOBIN (HGB A1C): Hemoglobin A1C: 8.7 % — AB (ref 4.0–5.6)

## 2022-03-01 MED ORDER — RYBELSUS 14 MG PO TABS: 14.0000 mg | ORAL_TABLET | Freq: Every day | ORAL | 1 refills | Status: DC

## 2022-03-01 NOTE — Assessment & Plan Note (Signed)
Repeat hgbA1c at 8.7% Not controlled with rybelsus 7mg  and metformin 850mg  BID BP at goal  LDL not at goal: under the care of lipid clinic, current use of crestor Normal urine microalbumin Normal foot exam today Advised to schedule appt with ophthalmology.  Increase rybelsus to 14mg  Maintain metformin dose Repeat BMP in 71month F/up in 38months

## 2022-03-01 NOTE — Patient Instructions (Addendum)
hgbA1c at 8.7%: uncontrolled DM Maintain metformin dose Increase rybelsus to 14mg  daily return to lab in 9month for repeat BMP

## 2022-03-01 NOTE — Patient Instructions (Signed)
Visit Information  Thank you for taking time to visit with me today. Please don't hesitate to contact me if I can be of assistance to you.   Following are the goals we discussed today:   Goals Addressed             This Visit's Progress    COMPLETED: Care coordination Activities-No follow up required       Care Coordination Interventions: Advised patient to Annual Wellness exam. Discussed [facility] services and support. Assessed SDOH. Advised to discuss with primary care physician if services needed in the future.         If you are experiencing a Mental Health or [place] or need someone to talk to, please call the Suicide and Crisis Lifeline: 988   Patient verbalizes understanding of instructions and care plan provided today and agrees to view in [place]. Active MyChart status and patient understanding of how to access instructions and care plan via MyChart confirmed with patient.     The patient has been provided with contact information for the care management team and has been advised to call with any health related questions or concerns.   Jone Baseman, RN, MSN [place] [facility] Care Management Coordinator Direct Line [phone]

## 2022-03-01 NOTE — Patient Outreach (Signed)
  Care Coordination   In Person Provider Office Visit Note   03/01/2022 Name: Audrey Russell MRN: [id number] DOB: [birth year]  Audrey Russell is a 67 y.o. year old female who sees [name], [name], NP for primary care. I engaged with Audrey Russell in the providers office today.  What matters to the patients health and wellness today?  none    Goals Addressed             This Visit's Progress    COMPLETED: Care coordination Activities-No follow up required       Care Coordination Interventions: Advised patient to Annual Wellness exam. Discussed [facility] services and support. Assessed SDOH. Advised to discuss with primary care physician if services needed in the future.        SDOH assessments and interventions completed:  Yes     Care Coordination Interventions:  Yes, provided   Follow up plan: No further intervention required.   Encounter Outcome:  Pt. Visit Completed   Jone Baseman, RN, MSN [place] [facility] Care Management Coordinator Direct Line [phone]

## 2022-03-01 NOTE — Progress Notes (Signed)
Established Patient Visit  Patient: Audrey Russell   DOB: Jun 28, 1955   67 y.o. Female  MRN: [id number] Visit Date: 03/01/2022  Subjective:    Chief Complaint  Patient presents with   Office visit     F/u diabetes.  Checking b/s and it's been around 140    HPI DM (diabetes mellitus) ([place]) Repeat hgbA1c at 8.7% Not controlled with rybelsus 7mg  and metformin 850mg  BID BP at goal  LDL not at goal: under the care of lipid clinic, current use of crestor Normal urine microalbumin Normal foot exam today Advised to schedule appt with ophthalmology.  Increase rybelsus to 14mg  Maintain metformin dose Repeat BMP in 66month F/up in 66months  Bipolar affective (HCC) Stable mood Denies need for medication or psychotherapy  BP Readings from Last 3 Encounters:  03/01/22 112/70  11/19/21 110/62  08/29/21 (!) 106/42    Wt Readings from Last 3 Encounters:  03/01/22 154 lb (69.9 kg)  11/19/21 154 lb 9.6 oz (70.1 kg)  08/29/21 157 lb 9.6 oz (71.5 kg)    Reviewed medical, surgical, and social history today  Medications: Outpatient Medications Prior to Visit  Medication Sig   acetaminophen (TYLENOL) 500 MG tablet Take 1,000 mg by mouth every 6 (six) hours as needed for mild pain or moderate pain.   albuterol (VENTOLIN HFA) 108 (90 Base) MCG/ACT inhaler INHALE 1-2 PUFFS BY MOUTH EVERY 6 HOURS AS NEEDED FOR WHEEZE OR SHORTNESS OF BREATH   aspirin EC 81 MG tablet Take 1 tablet (81 mg total) by mouth daily.   blood glucose meter kit and supplies One Touch Contour Monitor. Check glucose in AM, before breakfast. E11.65   budesonide-formoterol (SYMBICORT) 80-4.5 MCG/ACT inhaler INHALE 2 PUFFS INTO THE LUNGS DAILY. RINSE MOUTH AFTER EACH USE   cetirizine (ZYRTEC) 10 MG tablet Take 1 tablet (10 mg total) by mouth at bedtime.   citalopram (CELEXA) 40 MG tablet TAKE 1 TABLET BY MOUTH EVERY DAY   ezetimibe (ZETIA) 10 MG tablet TAKE 1 TABLET BY MOUTH EVERY DAY IN THE EVENING    famotidine (PEPCID) 20 MG tablet TAKE 1 TABLET BY MOUTH EVERY DAY   fenofibrate 160 MG tablet TAKE 1 TABLET BY MOUTH EVERY DAY   fluticasone (FLONASE) 50 MCG/ACT nasal spray SPRAY 2 SPRAYS INTO EACH NOSTRIL EVERY DAY   glucose blood (CONTOUR NEXT TEST) test strip 1 each by Other route as needed for other. Use as instructed   glucose blood (CONTOUR NEXT TEST) test strip Check glucose before breakfast. E11.65   metFORMIN (GLUCOPHAGE) 850 MG tablet Take 1 tablet (850 mg total) by mouth 2 (two) times daily with a meal.   mirtazapine (REMERON) 30 MG tablet TAKE 1 TABLET BY MOUTH EVERYDAY AT BEDTIME   ONETOUCH ULTRA test strip USE TO CHECK BLOOD GLUCOSE IN AM BEFORE BREAKFAST   pantoprazole (PROTONIX) 40 MG tablet TAKE 1 TABLET (40 MG TOTAL) BY MOUTH DAILY. TAKE 30-60 MIN BEFORE FIRST MEAL OF THE DAY   rosuvastatin (CRESTOR) 10 MG tablet Take 1 tablet (10 mg total) by mouth daily.   sodium chloride (OCEAN) 0.65 % SOLN nasal spray Place 1 spray into both nostrils as needed for congestion.   tiZANidine (ZANAFLEX) 4 MG tablet Take 1 tablet (4 mg total) by mouth every 6 (six) hours as needed for muscle spasms.   [DISCONTINUED] amoxicillin-clavulanate (AUGMENTIN) 875-125 MG tablet Take 1 tablet by mouth 2 (two) times daily.   [  DISCONTINUED] predniSONE (DELTASONE) 10 MG tablet Take  4 each am x 2 days,   2 each am x 2 days,  1 each am x 2 days and stop   [DISCONTINUED] Semaglutide (RYBELSUS) 7 MG TABS Take 7 mg by mouth daily.   No facility-administered medications prior to visit.   Reviewed past medical and social history.   ROS per HPI above      Objective:  BP 112/70 (BP Location: Left Arm, Patient Position: Sitting, Cuff Size: Normal)   Pulse 67   Temp (!) 97 F (36.1 C) (Temporal)   Resp 16   Ht 5\' 7"  (1.702 m)   Wt 154 lb (69.9 kg)   SpO2 97%   BMI 24.12 kg/m      Physical Exam Cardiovascular:     Rate and Rhythm: Normal rate and regular rhythm.     Pulses: Normal pulses.      Heart sounds: Normal heart sounds.  Pulmonary:     Effort: Pulmonary effort is normal.     Breath sounds: Normal breath sounds.  Musculoskeletal:     Right lower leg: No edema.     Left lower leg: No edema.  Neurological:     Mental Status: She is alert and oriented to person, place, and time.     Results for orders placed or performed in visit on 03/01/22  POCT glycosylated hemoglobin (Hb A1C)  Result Value Ref Range   Hemoglobin A1C 8.7 (A) 4.0 - 5.6 %   HbA1c POC (<> result, manual entry)     HbA1c, POC (prediabetic range)     HbA1c, POC (controlled diabetic range)        Assessment & Plan:    Problem List Items Addressed This Visit       Endocrine   DM (diabetes mellitus) ([place]) - Primary    Repeat hgbA1c at 8.7% Not controlled with rybelsus 7mg  and metformin 850mg  BID BP at goal  LDL not at goal: under the care of lipid clinic, current use of crestor Normal urine microalbumin Normal foot exam today Advised to schedule appt with ophthalmology.  Increase rybelsus to 14mg  Maintain metformin dose Repeat BMP in 85month F/up in 49months      Relevant Medications   Semaglutide (RYBELSUS) 14 MG TABS     Other   RESOLVED: Bipolar affective ([place])    Stable mood Denies need for medication or psychotherapy      Return in about 3 months (around 05/30/2022) for DM, hyperlipidemia (fasting), HTN.     Wilfred Lacy, NP

## 2022-03-01 NOTE — Assessment & Plan Note (Signed)
Stable mood Denies need for medication or psychotherapy

## 2022-03-06 ENCOUNTER — Other Ambulatory Visit: Payer: Medicare HMO

## 2022-03-26 ENCOUNTER — Other Ambulatory Visit: Payer: Self-pay | Admitting: Nurse Practitioner

## 2022-03-26 DIAGNOSIS — J449 Chronic obstructive pulmonary disease, unspecified: Secondary | ICD-10-CM

## 2022-03-29 ENCOUNTER — Other Ambulatory Visit: Payer: Medicare HMO

## 2022-04-01 ENCOUNTER — Other Ambulatory Visit: Payer: Medicare HMO

## 2022-04-29 ENCOUNTER — Telehealth: Payer: Self-pay

## 2022-04-29 NOTE — Telephone Encounter (Signed)
This nurse attempted to call patient three times for AWV. Message left that we will call again to schedule for another time. 

## 2022-05-06 ENCOUNTER — Encounter: Payer: Self-pay | Admitting: *Deleted

## 2022-05-10 ENCOUNTER — Telehealth: Payer: Self-pay | Admitting: Nurse Practitioner

## 2022-05-10 NOTE — Telephone Encounter (Signed)
Called patient to schedule Medicare Annual Wellness Visit (AWV). Left message for patient to call back and schedule Medicare Annual Wellness Visit (AWV).  Last date of AWV: 04/24/21  Please schedule an appointment at any time with [facility].  If any questions, please contact me at [phone].  Thank you ,  [place] [facility] AWV direct phone # [phone] 

## 2022-05-30 ENCOUNTER — Encounter: Payer: Self-pay | Admitting: Nurse Practitioner

## 2022-05-30 ENCOUNTER — Ambulatory Visit (INDEPENDENT_AMBULATORY_CARE_PROVIDER_SITE_OTHER): Payer: Medicare HMO | Admitting: Nurse Practitioner

## 2022-05-30 VITALS — BP 120/78 | HR 85 | Temp 98.2°F | Resp 16 | Ht 67.0 in | Wt 147.0 lb

## 2022-05-30 DIAGNOSIS — D126 Benign neoplasm of colon, unspecified: Secondary | ICD-10-CM

## 2022-05-30 DIAGNOSIS — Z7984 Long term (current) use of oral hypoglycemic drugs: Secondary | ICD-10-CM | POA: Diagnosis not present

## 2022-05-30 DIAGNOSIS — Z23 Encounter for immunization: Secondary | ICD-10-CM | POA: Diagnosis not present

## 2022-05-30 DIAGNOSIS — E1169 Type 2 diabetes mellitus with other specified complication: Secondary | ICD-10-CM

## 2022-05-30 DIAGNOSIS — G35 Multiple sclerosis: Secondary | ICD-10-CM | POA: Diagnosis not present

## 2022-05-30 DIAGNOSIS — I700 Atherosclerosis of aorta: Secondary | ICD-10-CM | POA: Diagnosis not present

## 2022-05-30 DIAGNOSIS — H6993 Unspecified Eustachian tube disorder, bilateral: Secondary | ICD-10-CM

## 2022-05-30 DIAGNOSIS — E785 Hyperlipidemia, unspecified: Secondary | ICD-10-CM

## 2022-05-30 DIAGNOSIS — J449 Chronic obstructive pulmonary disease, unspecified: Secondary | ICD-10-CM | POA: Diagnosis not present

## 2022-05-30 DIAGNOSIS — J418 Mixed simple and mucopurulent chronic bronchitis: Secondary | ICD-10-CM | POA: Insufficient documentation

## 2022-05-30 DIAGNOSIS — J4489 Other specified chronic obstructive pulmonary disease: Secondary | ICD-10-CM

## 2022-05-30 LAB — POCT GLYCOSYLATED HEMOGLOBIN (HGB A1C): Hemoglobin A1C: 7.6 % — AB (ref 4.0–5.6)

## 2022-05-30 LAB — MICROALBUMIN / CREATININE URINE RATIO
Creatinine,U: 72.3 mg/dL
Microalb Creat Ratio: 3 mg/g (ref 0.0–30.0)
Microalb, Ur: 2.2 mg/dL — ABNORMAL HIGH (ref 0.0–1.9)

## 2022-05-30 MED ORDER — BUDESONIDE-FORMOTEROL FUMARATE 80-4.5 MCG/ACT IN AERO: INHALATION_SPRAY | RESPIRATORY_TRACT | 5 refills | Status: DC

## 2022-05-30 MED ORDER — ALBUTEROL SULFATE HFA 108 (90 BASE) MCG/ACT IN AERS: 1.0000 | INHALATION_SPRAY | Freq: Four times a day (QID) | RESPIRATORY_TRACT | 1 refills | Status: DC | PRN

## 2022-05-30 MED ORDER — ROSUVASTATIN CALCIUM 10 MG PO TABS: 10.0000 mg | ORAL_TABLET | Freq: Every day | ORAL | 3 refills | Status: DC

## 2022-05-30 NOTE — Assessment & Plan Note (Signed)
Continue crestor and zetia Also under the care of cardiology

## 2022-05-30 NOTE — Progress Notes (Signed)
Established Patient Visit  Patient: Audrey Russell   DOB: 02-14-1955   67 y.o. Female  MRN: [id number] Visit Date: 05/30/2022  Subjective:    Chief Complaint  Patient presents with   Medical Management of Chronic Issues    Fasting- yes   Refill of Flonase, Symbicort and Crestor    HPI Mixed simple and mucopurulent chronic bronchitis (HCC) Ct chest 02/2022: Stable exam. No new or progressive findings to suggest recurrentor metastatic disease. Status post right upper lobectomy.  Asthmatic bronchitis , chronic Reports intermittent non productive cough and SOB with exertion, onset 02/2020 Worse with weather changes. Also under the care of Dr. Wert-pulmonology BP Readings from Last 3 Encounters:  05/30/22 120/78  03/01/22 112/70  11/19/21 110/62   CT chest 02/2022: Stable exam. No new or progressive findings to suggest recurrent or metastatic disease.Status post right upper lobectomy. PFT completed 2019: no obstructive airways disease, moderate restriction possible, severe diffusion defect  Maintain symbicort BID and albuterol prn Maintain annual appt with Dr. Sherene Sires  Hyperlipidemia associated with type 2 diabetes mellitus (HCC) Reminded to schedule appt with [facility] She did not complete labs ordered by cardiology Will order labs to and forward results to Dr. Carlena Bjornstad current use of crestor and zetia  DM (diabetes mellitus) (HCC) Fasting glucose: 150s Compliant with rybelsus and metformin Repeat hGba1c: 7.6% controlled Maintain med doses  Atherosclerosis of aorta (HCC) Continue crestor and zetia Also under the care of cardiology  Tubular adenoma of colon Reminded to schedule appt with GI for repeat colonoscopy  Wt Readings from Last 3 Encounters:  05/30/22 147 lb (66.7 kg)  03/01/22 154 lb (69.9 kg)  11/19/21 154 lb 9.6 oz (70.1 kg)    Reviewed medical, surgical, and social history today  Medications: Outpatient  Medications Prior to Visit  Medication Sig   acetaminophen (TYLENOL) 500 MG tablet Take 1,000 mg by mouth every 6 (six) hours as needed for mild pain or moderate pain.   aspirin EC 81 MG tablet Take 1 tablet (81 mg total) by mouth daily.   blood glucose meter kit and supplies One Touch Contour Monitor. Check glucose in AM, before breakfast. E11.65   cetirizine (ZYRTEC) 10 MG tablet Take 1 tablet (10 mg total) by mouth at bedtime.   citalopram (CELEXA) 40 MG tablet TAKE 1 TABLET BY MOUTH EVERY DAY   ezetimibe (ZETIA) 10 MG tablet TAKE 1 TABLET BY MOUTH EVERY DAY IN THE EVENING   famotidine (PEPCID) 20 MG tablet TAKE 1 TABLET BY MOUTH EVERY DAY   fenofibrate 160 MG tablet TAKE 1 TABLET BY MOUTH EVERY DAY   fluticasone (FLONASE) 50 MCG/ACT nasal spray SPRAY 2 SPRAYS INTO EACH NOSTRIL EVERY DAY   glucose blood (CONTOUR NEXT TEST) test strip 1 each by Other route as needed for other. Use as instructed   glucose blood (CONTOUR NEXT TEST) test strip Check glucose before breakfast. E11.65   metFORMIN (GLUCOPHAGE) 850 MG tablet Take 1 tablet (850 mg total) by mouth 2 (two) times daily with a meal.   mirtazapine (REMERON) 30 MG tablet TAKE 1 TABLET BY MOUTH EVERYDAY AT BEDTIME   ONETOUCH ULTRA test strip USE TO CHECK BLOOD GLUCOSE IN AM BEFORE BREAKFAST   pantoprazole (PROTONIX) 40 MG tablet TAKE 1 TABLET (40 MG TOTAL) BY MOUTH DAILY. TAKE 30-60 MIN BEFORE FIRST MEAL OF THE DAY   Semaglutide (RYBELSUS) 14 MG TABS Take 1 tablet (  14 mg total) by mouth daily.   sodium chloride (OCEAN) 0.65 % SOLN nasal spray Place 1 spray into both nostrils as needed for congestion.   tiZANidine (ZANAFLEX) 4 MG tablet Take 1 tablet (4 mg total) by mouth every 6 (six) hours as needed for muscle spasms.   [DISCONTINUED] albuterol (VENTOLIN HFA) 108 (90 Base) MCG/ACT inhaler INHALE 1-2 PUFFS BY MOUTH EVERY 6 HOURS AS NEEDED FOR WHEEZE OR SHORTNESS OF BREATH   [DISCONTINUED] budesonide-formoterol (SYMBICORT) 80-4.5 MCG/ACT  inhaler INHALE 2 PUFFS INTO THE LUNGS DAILY. RINSE MOUTH AFTER EACH USE   [DISCONTINUED] rosuvastatin (CRESTOR) 10 MG tablet Take 1 tablet (10 mg total) by mouth daily.   No facility-administered medications prior to visit.   Reviewed past medical and social history.   ROS per HPI above      Objective:  BP 120/78 (BP Location: Left Arm, Patient Position: Sitting, Cuff Size: Large)   Pulse 85   Temp 98.2 F (36.8 C) (Temporal)   Resp 16   Ht 5\' 7"  (1.702 m)   Wt 147 lb (66.7 kg)   SpO2 95%   BMI 23.02 kg/m      Physical Exam Vitals and nursing note reviewed.  Cardiovascular:     Rate and Rhythm: Normal rate and regular rhythm.     Pulses: Normal pulses.     Heart sounds: Normal heart sounds.  Pulmonary:     Effort: Pulmonary effort is normal. No respiratory distress.     Breath sounds: Rhonchi present. No wheezing.  Chest:     Chest wall: No tenderness.  Musculoskeletal:     Right lower leg: No edema.     Left lower leg: No edema.  Neurological:     Mental Status: She is alert and oriented to person, place, and time.  Psychiatric:        Mood and Affect: Mood normal.        Behavior: Behavior normal.        Thought Content: Thought content normal.     Results for orders placed or performed in visit on 05/30/22  POCT glycosylated hemoglobin (Hb A1C)  Result Value Ref Range   Hemoglobin A1C 7.6 (A) 4.0 - 5.6 %   HbA1c POC (<> result, manual entry)     HbA1c, POC (prediabetic range)     HbA1c, POC (controlled diabetic range)        Assessment & Plan:    Problem List Items Addressed This Visit       Cardiovascular and Mediastinum   Atherosclerosis of aorta (HCC)    Continue crestor and zetia Also under the care of cardiology      Relevant Medications   rosuvastatin (CRESTOR) 10 MG tablet   Other Relevant Orders   Apolipoprotein B   Lipoprotein A (LPA)   NMR, lipoprofile     Respiratory   Asthmatic bronchitis , chronic    Reports intermittent non  productive cough and SOB with exertion, onset 02/2020 Worse with weather changes. Also under the care of Dr. Wert-pulmonology BP Readings from Last 3 Encounters:  05/30/22 120/78  03/01/22 112/70  11/19/21 110/62  CT chest 02/2022: Stable exam. No new or progressive findings to suggest recurrent or metastatic disease.Status post right upper lobectomy. PFT completed 2019: no obstructive airways disease, moderate restriction possible, severe diffusion defect  Maintain symbicort BID and albuterol prn Maintain annual appt with Dr. Sherene Sires      Relevant Medications   budesonide-formoterol (SYMBICORT) 80-4.5 MCG/ACT inhaler   albuterol (  VENTOLIN HFA) 108 (90 Base) MCG/ACT inhaler     Digestive   Tubular adenoma of colon    Reminded to schedule appt with GI for repeat colonoscopy        Endocrine   DM (diabetes mellitus) (HCC)    Fasting glucose: 150s Compliant with rybelsus and metformin Repeat hGba1c: 7.6% controlled Maintain med doses      Relevant Medications   rosuvastatin (CRESTOR) 10 MG tablet   Other Relevant Orders   POCT glycosylated hemoglobin (Hb A1C) (Completed)   Microalbumin / creatinine urine ratio   Hyperlipidemia associated with type 2 diabetes mellitus (HCC)    Reminded to schedule appt with [facility] She did not complete labs ordered by cardiology Will order labs to and forward results to Dr. Tenny Craw Maintain current use of crestor and zetia      Relevant Medications   rosuvastatin (CRESTOR) 10 MG tablet   Other Relevant Orders   Apolipoprotein B   Lipoprotein A (LPA)   NMR, lipoprofile     Nervous and Auditory   MS (multiple sclerosis) (HCC)   Other Visit Diagnoses     Eustachian tube disorder, bilateral    -  Primary   Chronic obstructive pulmonary disease, unspecified COPD type (HCC)       Relevant Medications   budesonide-formoterol (SYMBICORT) 80-4.5 MCG/ACT inhaler   albuterol (VENTOLIN HFA) 108 (90 Base) MCG/ACT inhaler    Encounter for immunization       Relevant Orders   Varicella-zoster vaccine IM (Completed)      Return in about 6 months (around 11/30/2022) for DM, HTN, hyperlipidemia (fasting).     Alysia Penna, NP

## 2022-05-30 NOTE — Assessment & Plan Note (Signed)
Fasting glucose: 150s Compliant with rybelsus and metformin Repeat hGba1c: 7.6% controlled Maintain med doses

## 2022-05-30 NOTE — Assessment & Plan Note (Signed)
Reminded to schedule appt with [facility] She did not complete labs ordered by cardiology Will order labs to and forward results to Dr. Carlena Bjornstad current use of crestor and zetia

## 2022-05-30 NOTE — Assessment & Plan Note (Signed)
Reminded to schedule appt with GI for repeat colonoscopy ?

## 2022-05-30 NOTE — Assessment & Plan Note (Signed)
Ct chest 02/2022: Stable exam. No new or progressive findings to suggest recurrentor metastatic disease. Status post right upper lobectomy.

## 2022-05-30 NOTE — Assessment & Plan Note (Addendum)
Reports intermittent non productive cough and SOB with exertion, onset 02/2020 Worse with weather changes. Also under the care of Dr. Wert-pulmonology BP Readings from Last 3 Encounters:  05/30/22 120/78  03/01/22 112/70  11/19/21 110/62   CT chest 02/2022: Stable exam. No new or progressive findings to suggest recurrent or metastatic disease.Status post right upper lobectomy. PFT completed 2019: no obstructive airways disease, moderate restriction possible, severe diffusion defect  Maintain symbicort BID and albuterol prn Maintain annual appt with Dr. Sherene Sires

## 2022-05-30 NOTE — Patient Instructions (Addendum)
Go to lab Schedule appt for repeat colonoscopy

## 2022-05-31 ENCOUNTER — Other Ambulatory Visit: Payer: Self-pay | Admitting: Nurse Practitioner

## 2022-05-31 MED ORDER — ONETOUCH ULTRA VI STRP: ORAL_STRIP | 0 refills | Status: DC

## 2022-06-04 NOTE — Addendum Note (Signed)
Addended by: Lindley Magnus L on: 06/04/2022 04:30 PM   Modules accepted: Orders

## 2022-06-05 ENCOUNTER — Telehealth: Payer: Self-pay | Admitting: Nurse Practitioner

## 2022-06-05 LAB — NMR, LIPOPROFILE

## 2022-06-05 LAB — APOLIPOPROTEIN B

## 2022-06-05 LAB — LIPOPROTEIN A (LPA): Lipoprotein (a): 95 nmol/L — ABNORMAL HIGH (ref <75)

## 2022-06-05 NOTE — Telephone Encounter (Signed)
Pt has a lab appointment 06/06/22

## 2022-06-05 NOTE — Telephone Encounter (Signed)
-----   Message from Demetrius Charity, [place] sent at 06/05/2022 11:04 AM EDT ----- [facility] called yesterday and said that the wrong tubes were drawn on last Friday. I called the patient yesterday and left a voicemail for her to return our call to schedule a lab appt.

## 2022-06-05 NOTE — Progress Notes (Signed)
A lab appointment was made for 06/06/22

## 2022-06-06 ENCOUNTER — Other Ambulatory Visit: Payer: Medicare HMO

## 2022-06-24 ENCOUNTER — Other Ambulatory Visit: Payer: Self-pay | Admitting: Nurse Practitioner

## 2022-06-24 DIAGNOSIS — H6993 Unspecified Eustachian tube disorder, bilateral: Secondary | ICD-10-CM

## 2022-06-27 DIAGNOSIS — Z0100 Encounter for examination of eyes and vision without abnormal findings: Secondary | ICD-10-CM | POA: Diagnosis not present

## 2022-07-06 ENCOUNTER — Other Ambulatory Visit: Payer: Self-pay | Admitting: Nurse Practitioner

## 2022-07-11 ENCOUNTER — Other Ambulatory Visit: Payer: Self-pay | Admitting: Nurse Practitioner

## 2022-07-15 ENCOUNTER — Other Ambulatory Visit: Payer: Self-pay | Admitting: Nurse Practitioner

## 2022-07-15 DIAGNOSIS — E781 Pure hyperglyceridemia: Secondary | ICD-10-CM

## 2022-08-14 ENCOUNTER — Telehealth: Payer: Self-pay | Admitting: Nurse Practitioner

## 2022-08-14 ENCOUNTER — Ambulatory Visit: Payer: Medicare HMO | Admitting: Nurse Practitioner

## 2022-08-14 NOTE — Telephone Encounter (Signed)
Pts dog died in the night. No letter sent.

## 2022-08-14 NOTE — Telephone Encounter (Signed)
1st missed visit, fee waived

## 2022-08-20 ENCOUNTER — Ambulatory Visit: Payer: Medicare HMO | Admitting: Nurse Practitioner

## 2022-08-23 ENCOUNTER — Encounter: Payer: Self-pay | Admitting: Nurse Practitioner

## 2022-08-23 ENCOUNTER — Telehealth: Payer: Self-pay

## 2022-08-23 ENCOUNTER — Ambulatory Visit (INDEPENDENT_AMBULATORY_CARE_PROVIDER_SITE_OTHER): Payer: Medicare HMO | Admitting: Nurse Practitioner

## 2022-08-23 VITALS — BP 120/83 | HR 88 | Temp 98.1°F | Ht 67.0 in | Wt 145.0 lb

## 2022-08-23 DIAGNOSIS — I700 Atherosclerosis of aorta: Secondary | ICD-10-CM

## 2022-08-23 DIAGNOSIS — E785 Hyperlipidemia, unspecified: Secondary | ICD-10-CM | POA: Diagnosis not present

## 2022-08-23 DIAGNOSIS — Z7984 Long term (current) use of oral hypoglycemic drugs: Secondary | ICD-10-CM | POA: Diagnosis not present

## 2022-08-23 DIAGNOSIS — E1169 Type 2 diabetes mellitus with other specified complication: Secondary | ICD-10-CM | POA: Diagnosis not present

## 2022-08-23 MED ORDER — RYBELSUS 3 MG PO TABS: 3.0000 mg | ORAL_TABLET | Freq: Every day | ORAL | 5 refills | Status: DC

## 2022-08-23 NOTE — Addendum Note (Signed)
Addended by: Lindley Magnus L on: 08/23/2022 02:21 PM   Modules accepted: Orders

## 2022-08-23 NOTE — Telephone Encounter (Signed)
Pt called back and made her aware of Brittnay's instructions.  Scheduled lab appointment for 8/6 @ 9am.

## 2022-08-23 NOTE — Progress Notes (Signed)
Established Patient Visit  Patient: Audrey Russell   DOB: [birth year]   67 y.o. Female  MRN: [id number] Visit Date: 08/23/2022  Subjective:    Chief Complaint  Patient presents with   Medication Problem    Pt states the lower does of Rybelsus is much better. She is not vomiting and her appetite has returned    HPI DM (diabetes mellitus) (HCC) Fasting glucose: 140-160. Dinner consist of a bowl of cereal every night. Reports severe nausea and vomiting with rybelsus 14mg . She resumed 7mg  and symptoms resolved. She is concerned about persistent weight loss.  Last hgbA1c at 7.6% (2months ago) Advised to decrease rybelsus dose to 3mg  Advised to avoid cereal for dinner, replace with protein and vegetable Maintain other med doses F/up in 3months  BP Readings from Last 3 Encounters:  08/23/22 120/83  05/30/22 120/78  03/01/22 112/70   Wt Readings from Last 3 Encounters:  08/23/22 145 lb (65.8 kg)  05/30/22 147 lb (66.7 kg)  03/01/22 154 lb (69.9 kg)    Reviewed medical, surgical, and social history today  Medications: Outpatient Medications Prior to Visit  Medication Sig   acetaminophen (TYLENOL) 500 MG tablet Take 1,000 mg by mouth every 6 (six) hours as needed for mild pain or moderate pain.   albuterol (VENTOLIN HFA) 108 (90 Base) MCG/ACT inhaler Inhale 1-2 puffs into the lungs every 6 (six) hours as needed for wheezing or shortness of breath.   aspirin EC 81 MG tablet Take 1 tablet (81 mg total) by mouth daily.   blood glucose meter kit and supplies One Touch Contour Monitor. Check glucose in AM, before breakfast. E11.65   budesonide-formoterol (SYMBICORT) 80-4.5 MCG/ACT inhaler INHALE 2 PUFFS INTO THE LUNGS DAILY. RINSE MOUTH AFTER EACH USE   cetirizine (ZYRTEC) 10 MG tablet Take 1 tablet (10 mg total) by mouth at bedtime.   citalopram (CELEXA) 40 MG tablet TAKE 1 TABLET BY MOUTH EVERY DAY   ezetimibe (ZETIA) 10 MG tablet TAKE 1 TABLET BY MOUTH EVERY  DAY IN THE EVENING   famotidine (PEPCID) 20 MG tablet TAKE 1 TABLET BY MOUTH EVERY DAY   fenofibrate 160 MG tablet TAKE 1 TABLET BY MOUTH EVERY DAY   fluticasone (FLONASE) 50 MCG/ACT nasal spray SPRAY 2 SPRAYS INTO EACH NOSTRIL EVERY DAY   glucose blood (CONTOUR NEXT TEST) test strip 1 each by Other route as needed for other. Use as instructed   glucose blood (CONTOUR NEXT TEST) test strip Check glucose before breakfast. E11.65   metFORMIN (GLUCOPHAGE) 850 MG tablet Take 1 tablet (850 mg total) by mouth 2 (two) times daily with a meal.   mirtazapine (REMERON) 30 MG tablet TAKE 1 TABLET BY MOUTH EVERYDAY AT BEDTIME   ONETOUCH ULTRA TEST test strip USE AS INSTRUCTED   pantoprazole (PROTONIX) 40 MG tablet TAKE 1 TABLET (40 MG TOTAL) BY MOUTH DAILY. TAKE 30-60 MIN BEFORE FIRST MEAL OF THE DAY   rosuvastatin (CRESTOR) 10 MG tablet Take 1 tablet (10 mg total) by mouth daily.   sodium chloride (OCEAN) 0.65 % SOLN nasal spray Place 1 spray into both nostrils as needed for congestion.   tiZANidine (ZANAFLEX) 4 MG tablet Take 1 tablet (4 mg total) by mouth every 6 (six) hours as needed for muscle spasms.   [DISCONTINUED] Semaglutide (RYBELSUS) 14 MG TABS Take 1 tablet (14 mg total) by mouth daily.   No facility-administered medications prior to visit.  Reviewed past medical and social history.   ROS per HPI above      Objective:  BP 120/83 (BP Location: Left Arm, Patient Position: Sitting, Cuff Size: Normal)   Pulse 88   Temp 98.1 F (36.7 C) (Temporal)   Ht 5\' 7"  (1.702 m)   Wt 145 lb (65.8 kg)   SpO2 95%   BMI 22.71 kg/m      Physical Exam Vitals and nursing note reviewed.  Cardiovascular:     Rate and Rhythm: Normal rate.     Pulses: Normal pulses.  Pulmonary:     Effort: Pulmonary effort is normal.  Neurological:     Mental Status: She is alert and oriented to person, place, and time.     No results found for any visits on 08/23/22.    Assessment & Plan:    Problem List  Items Addressed This Visit       Cardiovascular and Mediastinum   Atherosclerosis of aorta (HCC)     Endocrine   DM (diabetes mellitus) (HCC) - Primary    Fasting glucose: 140-160. Dinner consist of a bowl of cereal every night. Reports severe nausea and vomiting with rybelsus 14mg . She resumed 7mg  and symptoms resolved. She is concerned about persistent weight loss.  Last hgbA1c at 7.6% (2months ago) Advised to decrease rybelsus dose to 3mg  Advised to avoid cereal for dinner, replace with protein and vegetable Maintain other med doses F/up in 3months      Relevant Medications   Semaglutide (RYBELSUS) 3 MG TABS   Hyperlipidemia associated with type 2 diabetes mellitus (HCC)   Relevant Medications   Semaglutide (RYBELSUS) 3 MG TABS   Other Visit Diagnoses     Long term (current) use of oral hypoglycemic drugs       Relevant Medications   Semaglutide (RYBELSUS) 3 MG TABS      Return in about 3 months (around 11/23/2022) for Maintain upcoming appt in November.     Alysia Penna, NP

## 2022-08-23 NOTE — Telephone Encounter (Signed)
I called and LVM for patient to return call.  If patient calls back, she needs a lab appointment. There was a mix up at the lab and she needs to have labs redrawn.

## 2022-08-23 NOTE — Assessment & Plan Note (Addendum)
Fasting glucose: 140-160. Dinner consist of a bowl of cereal every night. Reports severe nausea and vomiting with rybelsus 14mg . She resumed 7mg  and symptoms resolved. She is concerned about persistent weight loss.  Last hgbA1c at 7.6% (2months ago) Advised to decrease rybelsus dose to 3mg  Advised to avoid cereal for dinner, replace with protein and vegetable Maintain other med doses F/up in 3months

## 2022-08-23 NOTE — Patient Instructions (Signed)
Decrease rybelsus dose to 3mg  in AM Change dinner to protein and vegetable Go to lab

## 2022-08-27 ENCOUNTER — Other Ambulatory Visit: Payer: Medicare HMO

## 2022-08-31 ENCOUNTER — Encounter: Payer: Self-pay | Admitting: Nurse Practitioner

## 2022-09-02 ENCOUNTER — Other Ambulatory Visit: Payer: Self-pay | Admitting: Nurse Practitioner

## 2022-09-02 DIAGNOSIS — Z7984 Long term (current) use of oral hypoglycemic drugs: Secondary | ICD-10-CM

## 2022-09-02 DIAGNOSIS — E1169 Type 2 diabetes mellitus with other specified complication: Secondary | ICD-10-CM

## 2022-09-02 NOTE — Assessment & Plan Note (Addendum)
D/c rybelsus due to G side effects: ABDOMEN pain, nausea and vomiting). Maintain metformin dose

## 2022-09-03 ENCOUNTER — Other Ambulatory Visit: Payer: Self-pay | Admitting: Nurse Practitioner

## 2022-09-03 DIAGNOSIS — E1142 Type 2 diabetes mellitus with diabetic polyneuropathy: Secondary | ICD-10-CM

## 2022-09-13 ENCOUNTER — Ambulatory Visit (INDEPENDENT_AMBULATORY_CARE_PROVIDER_SITE_OTHER): Payer: Medicare HMO

## 2022-09-13 DIAGNOSIS — Z0000 Encounter for general adult medical examination without abnormal findings: Secondary | ICD-10-CM | POA: Diagnosis not present
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
## 2022-09-13 NOTE — Patient Instructions (Signed)
Ms. Smigel , Thank you for taking time to come for your Medicare Wellness Visit. I appreciate your ongoing commitment to your health goals. Please review the following plan we discussed and let me know if I can assist you in the future.   Referrals/Orders/Follow-Ups/Clinician Recommendations: none  This is a list of the screening recommended for you and due dates:  Health Maintenance  Topic Date Due   COVID-19 Vaccine (5 - 2023-24 season) 12/22/2021   Eye exam for diabetics  08/09/2022   Flu Shot  08/22/2022   Colon Cancer Screening  01/21/2023*   Mammogram  10/09/2022   Hemoglobin A1C  11/30/2022   Yearly kidney function blood test for diabetes  02/23/2023   Complete foot exam   03/02/2023   Yearly kidney health urinalysis for diabetes  05/30/2023   Medicare Annual Wellness Visit  09/13/2023   DTaP/Tdap/Td vaccine (2 - Td or Tdap) 01/29/2027   Pneumonia Vaccine  Completed   DEXA scan (bone density measurement)  Completed   Hepatitis C Screening  Completed   Zoster (Shingles) Vaccine  Completed   HPV Vaccine  Aged Out  *Topic was postponed. The date shown is not the original due date.    Advanced directives: (Copy Requested) Please bring a copy of your health care power of attorney and living will to the office to be added to your chart at your convenience.  Next Medicare Annual Wellness Visit scheduled for next year: Yes  Insert Preventive Care attachment Insert FALL PREVENTION attachment if needed

## 2022-09-22 ENCOUNTER — Other Ambulatory Visit: Payer: Self-pay | Admitting: Nurse Practitioner

## 2022-09-22 DIAGNOSIS — F325 Major depressive disorder, single episode, in full remission: Secondary | ICD-10-CM

## 2022-09-27 ENCOUNTER — Other Ambulatory Visit: Payer: Self-pay | Admitting: Nurse Practitioner

## 2022-09-27 DIAGNOSIS — E1142 Type 2 diabetes mellitus with diabetic polyneuropathy: Secondary | ICD-10-CM

## 2022-10-23 ENCOUNTER — Other Ambulatory Visit: Payer: Self-pay | Admitting: Nurse Practitioner

## 2022-10-23 DIAGNOSIS — E781 Pure hyperglyceridemia: Secondary | ICD-10-CM

## 2022-10-25 ENCOUNTER — Other Ambulatory Visit: Payer: Self-pay | Admitting: Nurse Practitioner

## 2022-10-25 DIAGNOSIS — E1142 Type 2 diabetes mellitus with diabetic polyneuropathy: Secondary | ICD-10-CM

## 2022-11-10 ENCOUNTER — Other Ambulatory Visit: Payer: Self-pay | Admitting: Nurse Practitioner

## 2022-11-10 DIAGNOSIS — E1142 Type 2 diabetes mellitus with diabetic polyneuropathy: Secondary | ICD-10-CM

## 2022-11-25 ENCOUNTER — Encounter (HOSPITAL_BASED_OUTPATIENT_CLINIC_OR_DEPARTMENT_OTHER): Payer: Self-pay

## 2022-11-30 ENCOUNTER — Other Ambulatory Visit: Payer: Self-pay | Admitting: Nurse Practitioner

## 2022-12-02 ENCOUNTER — Encounter: Payer: Self-pay | Admitting: Nurse Practitioner

## 2022-12-02 ENCOUNTER — Ambulatory Visit (INDEPENDENT_AMBULATORY_CARE_PROVIDER_SITE_OTHER): Payer: 59 | Admitting: Nurse Practitioner

## 2022-12-02 VITALS — BP 138/75 | HR 76 | Temp 98.4°F | Resp 18 | Wt 156.2 lb

## 2022-12-02 DIAGNOSIS — E1169 Type 2 diabetes mellitus with other specified complication: Secondary | ICD-10-CM

## 2022-12-02 DIAGNOSIS — I10 Essential (primary) hypertension: Secondary | ICD-10-CM | POA: Diagnosis not present

## 2022-12-02 DIAGNOSIS — J4489 Other specified chronic obstructive pulmonary disease: Secondary | ICD-10-CM | POA: Diagnosis not present

## 2022-12-02 DIAGNOSIS — Z1231 Encounter for screening mammogram for malignant neoplasm of breast: Secondary | ICD-10-CM

## 2022-12-02 DIAGNOSIS — Z7984 Long term (current) use of oral hypoglycemic drugs: Secondary | ICD-10-CM

## 2022-12-02 DIAGNOSIS — H9011 Conductive hearing loss, unilateral, right ear, with unrestricted hearing on the contralateral side: Secondary | ICD-10-CM | POA: Insufficient documentation

## 2022-12-02 LAB — HEPATIC FUNCTION PANEL
ALT: 14 U/L (ref 0–35)
AST: 19 U/L (ref 0–37)
Albumin: 4.5 g/dL (ref 3.5–5.2)
Alkaline Phosphatase: 67 U/L (ref 39–117)
Bilirubin, Direct: 0.1 mg/dL (ref 0.0–0.3)
Total Bilirubin: 0.4 mg/dL (ref 0.2–1.2)
Total Protein: 7.6 g/dL (ref 6.0–8.3)

## 2022-12-02 LAB — CBC WITH DIFFERENTIAL/PLATELET
Basophils Absolute: 0.1 10*3/uL (ref 0.0–0.1)
Basophils Relative: 1.1 % (ref 0.0–3.0)
Eosinophils Absolute: 0.1 10*3/uL (ref 0.0–0.7)
Eosinophils Relative: 1.2 % (ref 0.0–5.0)
HCT: 44.6 % (ref 36.0–46.0)
Hemoglobin: 14.9 g/dL (ref 12.0–15.0)
Lymphocytes Relative: 31.6 % (ref 12.0–46.0)
Lymphs Abs: 2.4 10*3/uL (ref 0.7–4.0)
MCHC: 33.3 g/dL (ref 30.0–36.0)
MCV: 86.6 fL (ref 78.0–100.0)
Monocytes Absolute: 0.6 10*3/uL (ref 0.1–1.0)
Monocytes Relative: 7.5 % (ref 3.0–12.0)
Neutro Abs: 4.4 10*3/uL (ref 1.4–7.7)
Neutrophils Relative %: 58.6 % (ref 43.0–77.0)
Platelets: 248 10*3/uL (ref 150.0–400.0)
RBC: 5.15 Mil/uL — ABNORMAL HIGH (ref 3.87–5.11)
RDW: 14.4 % (ref 11.5–15.5)
WBC: 7.6 10*3/uL (ref 4.0–10.5)

## 2022-12-02 LAB — RENAL FUNCTION PANEL
Albumin: 4.5 g/dL (ref 3.5–5.2)
BUN: 18 mg/dL (ref 6–23)
CO2: 25 meq/L (ref 19–32)
Calcium: 9.9 mg/dL (ref 8.4–10.5)
Chloride: 107 meq/L (ref 96–112)
Creatinine, Ser: 0.97 mg/dL (ref 0.40–1.20)
GFR: 60.72 mL/min (ref >60.00)
Glucose, Bld: 194 mg/dL — ABNORMAL HIGH (ref 70–99)
Phosphorus: 3.6 mg/dL (ref 2.3–4.6)
Potassium: 4.5 meq/L (ref 3.5–5.1)
Sodium: 140 meq/L (ref 135–145)

## 2022-12-02 LAB — HEMOGLOBIN A1C: Hgb A1c MFr Bld: 9 % — ABNORMAL HIGH (ref 4.6–6.5)

## 2022-12-02 MED ORDER — EMPAGLIFLOZIN 10 MG PO TABS: 10.0000 mg | ORAL_TABLET | Freq: Every day | ORAL | 5 refills | Status: DC

## 2022-12-02 MED ORDER — BENZONATATE 200 MG PO CAPS: 200.0000 mg | ORAL_CAPSULE | Freq: Three times a day (TID) | ORAL | 0 refills | Status: DC | PRN

## 2022-12-02 NOTE — Progress Notes (Signed)
Established Patient Visit  Patient: Audrey Russell   DOB: 10-20-1955   67 y.o. Female  MRN: [id number] Visit Date: 12/02/2022  Subjective:    Chief Complaint  Patient presents with   OFFICE VISIT    6 month follow up. PT is due to eye exam and mammogram    Diabetes   Hypertension   Hyperlipidemia   Ear Fullness  There is pain in the right ear. This is a recurrent problem. The current episode started 1 to 4 weeks ago. The problem occurs constantly. The problem has been unchanged. There has been no fever. Associated symptoms include coughing and hearing loss. Pertinent negatives include no abdominal pain, diarrhea, ear discharge, headaches, neck pain, rash, rhinorrhea, sore throat or vomiting. She has tried nothing for the symptoms. Her past medical history is significant for a tympanostomy tube.  She has upcoming appointment with ENT.  Essential hypertension BP remains at goal without medication BP Readings from Last 3 Encounters:  12/02/22 138/75  08/23/22 120/83  05/30/22 120/78    Asthmatic bronchitis , chronic Acute on chronic cough and SOB, worse in last 2weeks. Associated with night sweats, no chest pain, no fever, no sinus congestion Use of albuterol and symbicort BID with some improvement.  Get CXR and cbc Use benzonatate and mucinex dm for cough Maintain current inhalers  DM (diabetes mellitus) (HCC) D/c rybelsus due to G side effects: ABDOMEN pain, nausea and vomiting). These symptoms have since resolved. Current use of metformin 850mg  BID  Repeat hgbA1c and BMP GI symptoms resolved. Increase cough and SOB No PND, no fever, no chills Night sweats.  Wt Readings from Last 3 Encounters:  12/02/22 156 lb 3.2 oz (70.9 kg)  08/23/22 145 lb (65.8 kg)  05/30/22 147 lb (66.7 kg)    Reviewed medical, surgical, and social history today  Medications: Outpatient Medications Prior to Visit  Medication Sig   acetaminophen (TYLENOL) 500 MG  tablet Take 1,000 mg by mouth every 6 (six) hours as needed for mild pain or moderate pain.   albuterol (VENTOLIN HFA) 108 (90 Base) MCG/ACT inhaler Inhale 1-2 puffs into the lungs every 6 (six) hours as needed for wheezing or shortness of breath.   aspirin EC 81 MG tablet Take 1 tablet (81 mg total) by mouth daily.   blood glucose meter kit and supplies One Touch Contour Monitor. Check glucose in AM, before breakfast. E11.65   budesonide-formoterol (SYMBICORT) 80-4.5 MCG/ACT inhaler INHALE 2 PUFFS INTO THE LUNGS DAILY. RINSE MOUTH AFTER EACH USE   cetirizine (ZYRTEC) 10 MG tablet Take 1 tablet (10 mg total) by mouth at bedtime.   citalopram (CELEXA) 40 MG tablet TAKE 1 TABLET BY MOUTH EVERY DAY   ezetimibe (ZETIA) 10 MG tablet TAKE 1 TABLET BY MOUTH EVERY DAY IN THE EVENING   famotidine (PEPCID) 20 MG tablet TAKE 1 TABLET BY MOUTH EVERY DAY   fenofibrate 160 MG tablet TAKE 1 TABLET BY MOUTH EVERY DAY   fluticasone (FLONASE) 50 MCG/ACT nasal spray SPRAY 2 SPRAYS INTO EACH NOSTRIL EVERY DAY   glucose blood (CONTOUR NEXT TEST) test strip 1 each by Other route as needed for other. Use as instructed   glucose blood (CONTOUR NEXT TEST) test strip Check glucose before breakfast. E11.65   metFORMIN (GLUCOPHAGE) 850 MG tablet Take 1 tablet (850 mg total) by mouth 2 (two) times daily with a meal.   mirtazapine (REMERON) 30 MG tablet  TAKE 1 TABLET BY MOUTH EVERYDAY AT BEDTIME   ONETOUCH ULTRA TEST test strip USE AS INSTRUCTED   pantoprazole (PROTONIX) 40 MG tablet TAKE 1 TABLET (40 MG TOTAL) BY MOUTH DAILY. TAKE 30-60 MIN BEFORE FIRST MEAL OF THE DAY   rosuvastatin (CRESTOR) 10 MG tablet Take 1 tablet (10 mg total) by mouth daily.   sodium chloride (OCEAN) 0.65 % SOLN nasal spray Place 1 spray into both nostrils as needed for congestion.   tiZANidine (ZANAFLEX) 4 MG tablet Take 1 tablet (4 mg total) by mouth every 6 (six) hours as needed for muscle spasms.   No facility-administered medications prior to  visit.   Reviewed past medical and social history.   ROS per HPI above      Objective:  BP 138/75 (BP Location: Left Arm, Patient Position: Sitting, Cuff Size: Normal)   Pulse 76   Temp 98.4 F (36.9 C) (Temporal)   Resp 18   Wt 156 lb 3.2 oz (70.9 kg)   SpO2 98%   BMI 24.46 kg/m      Physical Exam Vitals and nursing note reviewed.  Constitutional:      General: She is not in acute distress. HENT:     Right Ear: External ear normal. Decreased hearing noted. No drainage or tenderness. No middle ear effusion. There is no impacted cerumen. No mastoid tenderness. Tympanic membrane is scarred and retracted. Tympanic membrane is not injected, perforated, erythematous or bulging.     Left Ear: Tympanic membrane, ear canal and external ear normal. No decreased hearing noted. There is no impacted cerumen.  Cardiovascular:     Rate and Rhythm: Normal rate and regular rhythm.     Pulses: Normal pulses.     Heart sounds: Normal heart sounds.  Pulmonary:     Effort: Pulmonary effort is normal. No respiratory distress.     Breath sounds: Normal breath sounds.  Neurological:     Mental Status: She is alert and oriented to person, place, and time.     No results found for any visits on 12/02/22.    Assessment & Plan:    Problem List Items Addressed This Visit     Asthmatic bronchitis , chronic (HCC)    Acute on chronic cough and SOB, worse in last 2weeks. Associated with night sweats, no chest pain, no fever, no sinus congestion Use of albuterol and symbicort BID with some improvement.  Get CXR and cbc Use benzonatate and mucinex dm for cough Maintain current inhalers      Relevant Medications   benzonatate (TESSALON) 200 MG capsule   Other Relevant Orders   DG Chest 2 View   CBC with Differential/Platelet   DM (diabetes mellitus) (HCC) - Primary    D/c rybelsus due to G side effects: ABDOMEN pain, nausea and vomiting). These symptoms have since resolved. Current use of  metformin 850mg  BID  Repeat hgbA1c and BMP      Relevant Orders   Hemoglobin A1c   Renal Function Panel   Hepatic function panel   Essential hypertension    BP remains at goal without medication BP Readings from Last 3 Encounters:  12/02/22 138/75  08/23/22 120/83  05/30/22 120/78        Relevant Orders   Renal Function Panel   Other Visit Diagnoses     Breast cancer screening by mammogram       Relevant Orders   MM 3D SCREENING MAMMOGRAM BILATERAL BREAST   Conductive hearing loss of right ear with  unrestricted hearing of left ear       Relevant Orders   Ambulatory referral to Audiology      Return in about 6 months (around 06/01/2023) for HTN, DM, hyperlipidemia (fasting).     Alysia Penna, NP

## 2022-12-02 NOTE — Addendum Note (Signed)
Addended by: Alysia Penna L on: 12/02/2022 02:21 PM   Modules accepted: Orders

## 2022-12-02 NOTE — Patient Instructions (Addendum)
Go to [address] for CXR Alternate between benzonatate and mucinex DIABETES every 4hrs as needed for cough Schedule appointment for Dm eye exam Go to lab.

## 2022-12-02 NOTE — Assessment & Plan Note (Addendum)
Acute on chronic cough and SOB, worse in last 2weeks. Associated with night sweats, no chest pain, no fever, no sinus congestion Use of albuterol and symbicort BID with some improvement.  Get CXR and cbc Use benzonatate and mucinex dm for cough Maintain current inhalers

## 2022-12-02 NOTE — Assessment & Plan Note (Addendum)
D/c rybelsus due to GI side effects: ABDOMEN pain, nausea and vomiting).These symptoms have since resolved. Current use of metformin 850mg  BID  Repeat hgbA1c and BMP: 9% with normal renal function Uncontrolled DM maintain metformin dose. Add jardiance 10mg  daily. Advised to Maintain adequate oral hydration to prevent dehydration, decline in renal function and UTI. repeat BMP and urinalysis 35month after starting jardiance.

## 2022-12-02 NOTE — Assessment & Plan Note (Signed)
BP remains at goal without medication BP Readings from Last 3 Encounters:  12/02/22 138/75  08/23/22 120/83  05/30/22 120/78

## 2022-12-03 ENCOUNTER — Telehealth: Payer: Self-pay | Admitting: Nurse Practitioner

## 2022-12-03 NOTE — Telephone Encounter (Signed)
Pt was here this am and given a cough med. It is not covered by her insurance and she is asking if you will please send in something else.

## 2022-12-18 ENCOUNTER — Telehealth: Payer: Self-pay | Admitting: Internal Medicine

## 2022-12-18 ENCOUNTER — Other Ambulatory Visit: Payer: Self-pay

## 2022-12-18 ENCOUNTER — Telehealth: Payer: Self-pay | Admitting: Nurse Practitioner

## 2022-12-18 DIAGNOSIS — J449 Chronic obstructive pulmonary disease, unspecified: Secondary | ICD-10-CM

## 2022-12-18 MED ORDER — ALBUTEROL SULFATE HFA 108 (90 BASE) MCG/ACT IN AERS: 1.0000 | INHALATION_SPRAY | Freq: Four times a day (QID) | RESPIRATORY_TRACT | 1 refills | Status: DC | PRN

## 2022-12-18 MED ORDER — BUDESONIDE-FORMOTEROL FUMARATE 80-4.5 MCG/ACT IN AERO: INHALATION_SPRAY | RESPIRATORY_TRACT | 5 refills | Status: DC

## 2022-12-18 NOTE — Telephone Encounter (Signed)
Pt needs a refill for albuterol (VENTOLIN HFA) 108 (90 Base) MCG/ACT inhaler & budesonide-formoterol (SYMBICORT) 80-4.5 MCG/ACT inhaler [id number] - [place], [place] - [address] AT [facility] OF [address] & [address]

## 2022-12-18 NOTE — Telephone Encounter (Signed)
Spoke to patient and informed here that RX request was refilled and sent to pharmacy. Patient verbalized understanding and a thank you.

## 2022-12-18 NOTE — Telephone Encounter (Signed)
Prescription Request  12/18/2022  LOV: 12/02/2022  What is the name of the medication or equipment? albuterol (VENTOLIN HFA) 108 (90 Base) MCG/ACT inhaler [[id number]]  and budesonide-formoterol (SYMBICORT) 80-4.5 MCG/ACT inhaler [[id number]]   Have you contacted your pharmacy to request a refill? Yes   [id number] - [place], [place] - [address] AT [facility] OF [address] [place] [address] Phone: [phone] Fax: [phone]    Patient notified that their request is being sent to the clinical staff for review and that they should receive a response within 2 business days.   Please advise at  [phone]

## 2022-12-24 ENCOUNTER — Telehealth: Payer: Self-pay | Admitting: Nurse Practitioner

## 2022-12-24 NOTE — Telephone Encounter (Signed)
Refill request  Pharmacy change  fluticasone [facility]) 50 MCG/ACT nasal spray [[id number]]  [facility][id number] - [place], [place] - [address] AT [facility] OF [address], [place] [address] Phone: [phone]  Fax: [phone]

## 2022-12-24 NOTE — Telephone Encounter (Signed)
error 

## 2022-12-24 NOTE — Telephone Encounter (Signed)
RX refill was already sent on 12/18/2022; request is way to soon.

## 2023-01-06 ENCOUNTER — Ambulatory Visit
Admission: RE | Admit: 2023-01-06 | Discharge: 2023-01-06 | Disposition: A | Discharge status: Home / Self Care | Payer: 59 | Source: Ambulatory Visit | Attending: Nurse Practitioner | Admitting: Nurse Practitioner

## 2023-01-06 DIAGNOSIS — Z1231 Encounter for screening mammogram for malignant neoplasm of breast: Secondary | ICD-10-CM

## 2023-01-09 ENCOUNTER — Other Ambulatory Visit: Payer: Self-pay | Admitting: Nurse Practitioner

## 2023-01-09 DIAGNOSIS — H6993 Unspecified Eustachian tube disorder, bilateral: Secondary | ICD-10-CM

## 2023-01-09 NOTE — Telephone Encounter (Signed)
LR 06/24/22, 1 rf LOV  12/02/22 FOV  06/02/23 Please review and advised. Dm/cma  Copied from CRM [id number]. Topic: Clinical - Medication Refill >> Jan 09, 2023 11:24 AM Almira Coaster wrote: Most Recent Primary Care Visit:  Provider: Anne Ng  Department: [facility]  Visit Type: OFFICE VISIT  Date: 12/02/2022  Medication: fluticasone (FLONASE) 50 MCG/ACT nasal spray  Has the patient contacted their pharmacy? Yes, patient [facility] to [facility] and they told her they did not have this prescription.  (Agent: If no, request that the patient contact the pharmacy for the refill. If patient does not wish to contact the pharmacy document the reason why and proceed with request.) (Agent: If yes, when and what did the pharmacy advise?)  Is this the correct pharmacy for this prescription? Yes If no, delete pharmacy and type the correct one.  This is the patient's preferred pharmacy:  [facility][id number] - [place], [place] - [address] AT [facility] OF [address] [place] [address] Phone: [phone] Fax: [phone]     Has the prescription been filled recently? No  Is the patient out of the medication? Yes  Has the patient been seen for an appointment in the last year OR does the patient have an upcoming appointment? Yes  Can we respond through MyChart? No, patient prefers a phone call  Agent: Please be advised that Rx refills may take up to 3 business days. We ask that you follow-up with your pharmacy.

## 2023-01-14 ENCOUNTER — Ambulatory Visit: Payer: Medicare HMO | Admitting: Internal Medicine

## 2023-01-30 ENCOUNTER — Other Ambulatory Visit: Payer: Self-pay | Admitting: Nurse Practitioner

## 2023-01-30 DIAGNOSIS — E781 Pure hyperglyceridemia: Secondary | ICD-10-CM

## 2023-01-30 DIAGNOSIS — E1165 Type 2 diabetes mellitus with hyperglycemia: Secondary | ICD-10-CM

## 2023-01-30 MED ORDER — METFORMIN HCL 850 MG PO TABS: 850.0000 mg | ORAL_TABLET | Freq: Two times a day (BID) | ORAL | 1 refills | Status: DC

## 2023-01-30 NOTE — Telephone Encounter (Signed)
 Patient notified VIA phone. Dm/cma

## 2023-01-30 NOTE — Telephone Encounter (Signed)
 Copied from CRM [id number]. Topic: Clinical - Medication Refill >> Jan 29, 2023  4:38 PM Merlynn LABOR wrote: Most Recent Primary Care Visit:  Provider: KATHEEN ROSELIE ROCKFORD  Department: [facility]  Visit Type: OFFICE VISIT  Date: 12/02/2022  Medication: metFORMIN  (GLUCOPHAGE ) 850 MG tablet   Has the patient contacted their pharmacy? Yes (Agent: If no, request that the patient contact the pharmacy for the refill. If patient does not wish to contact the pharmacy document the reason why and proceed with request.) (Agent: If yes, when and what did the pharmacy advise?)  Patient called pharmacy and was advised that she needed to contact provider to request refill. Patient switched pharmacies and would like for prescription to be sent to the pharmacy listed below.   Is this the correct pharmacy for this prescription? Yes If no, delete pharmacy and type the correct one.  This is the patient's preferred pharmacy:  [id number] - [place], [place] - [address] AT [facility] OF [address] & [address]   [address] Phone: [phone] Fax: [phone]    Has the prescription been filled recently? No  Is the patient out of the medication? Yes  Has the patient been seen for an appointment in the last year OR does the patient have an upcoming appointment? Yes  Can we respond through MyChart? Yes  Agent: Please be advised that Rx refills may take up to 3 business days. We ask that you follow-up with your pharmacy.

## 2023-02-10 NOTE — Progress Notes (Unsigned)
Subjective:     Patient ID: Audrey Russell, female   DOB: 1955-08-24     MRN: [id number]    Brief patient profile:  82 yobf from [place]  quit smoking 2019 moved to [place] 2016 with cough/doe x fall   2017referred to pulmonary clinic 06/26/2017 by Dr   Elease Etienne with wt loss, night sweats and cavitary lesion in RUL post segment.    06/26/2017 1st [facility] office visit/ Cayce Quezada   Chief Complaint  Patient presents with   Pulmonary Consult    Referred by Dr. Alysia Penna for eval of abnormal ct chest done 06/12/17.  She states she has been having SOB for the past 1-2 yrs. She states she "coughs constantly"- started back in Sept 2018 but has been getting progressively worse. Her cough is occ prod with clear to yellow sputum.    initially started with cough fall 2017 thick yellow / green phlegm no better with abx and never bloody worse in early am and up to a small cup in vol in am reported   Doe= MMRC3 = can't walk 100 yards even at a slow pace at a flat grade s stopping due to sob   Rec Pantoprazole (protonix) 40 mg   Take  30-60 min before first meal of the day and Pepcid (famotidine)  20 mg one after supper until return to office GERD diet reviewed, bed blocks  Only use your albuterol as a rescue medication   Please schedule a follow up office visit in 4 weeks, sooner if needed  with all medications /inhalers/ solutions in hand   03/01/2021  re-establish  ov/Marquavis Hannen re: cough /doe   maint on saba no ppi  Chief Complaint  Patient presents with   Acute Visit    Increased DOE and cough since Oct 2022- non prod.    Dyspnea:  walks dog daily / [employer] parking ; Cough: first thing in am rattling / min mucoid assoc with nasal congestion/ prior f/b Teoh Sleeping: L side down,  bed is flat 4 x  SABA use: 4 x daily with baseline hfa < 50%  02: none  Covid status:   vax x 2  Rec Pantoprazole (protonix) 40 mg   Take  30-60 min before first meal of the day and Pepcid (famotidine)   20 mg after supper until return to office  GERD diet reviewed, bed blocks rec  Prednisone 10 mg take  4 each am x 2 days,   2 each am x 2 days,  1 each am x 2 days and stop  Augmentin 875 mg take one pill twice daily  X 10 days Plan A = Automatic = Always=    Symbicort 80 Take 2 puffs first thing in am and then another 2 puffs about 12 hours later.   Work on inhaler technique:    Plan B = Backup (to supplement plan A, not to replace it) Only use your albuterol inhaler as a rescue medication  If this is working call for alternative to symbicort  Please schedule a follow up office visit in 6 weeks, call sooner if needed - bring inhalers       11/19/2021  f/u ov/Haruto Demaria re: AB   maint on symbicort 80 2bid x 2 weeks worse  No chief complaint on file.   Dyspnea:  still walking neighborhood, slow pace  Cough: yellow worse in ams x sev weeks Sleeping: 45 degrees with pillows SABA use: 3 x since sick 02: none  Covid status:   vax 3rd complete 2 weeks  Lung cancer screening :  being followed  by [facility]  Rec    02/11/2023  f/u ov/Burgess Sheriff re: ***   maint on ***  No chief complaint on file.   Dyspnea:  *** Cough: *** Sleeping: *** resp cc  SABA use: *** 02: ***  Lung cancer screening :  ***    No obvious day to day or daytime variability or assoc excess/ purulent sputum or mucus plugs or hemoptysis or cp or chest tightness, subjective wheeze or overt sinus or hb symptoms.    Also denies any obvious fluctuation of symptoms with weather or environmental changes or other aggravating or alleviating factors except as outlined above   No unusual exposure hx or h/o childhood pna/ asthma or knowledge of premature birth.  Current Allergies, Complete Past Medical History, Past Surgical History, Family History, and Social History were reviewed in [employer] record.  ROS  The following are not active complaints unless bolded Hoarseness, sore throat, dysphagia, dental  problems, itching, sneezing,  nasal congestion or discharge of excess mucus or purulent secretions, ear ache,   fever, chills, sweats, unintended wt loss or wt gain, classically pleuritic or exertional cp,  orthopnea pnd or arm/hand swelling  or leg swelling, presyncope, palpitations, abdominal pain, anorexia, nausea, vomiting, diarrhea  or change in bowel habits or change in bladder habits, change in stools or change in urine, dysuria, hematuria,  rash, arthralgias, visual complaints, headache, numbness, weakness or ataxia or problems with walking or coordination,  change in mood or  memory.        No outpatient medications have been marked as taking for the 02/11/23 encounter (Appointment) with Nyoka Cowden, MD.                Objective:   Physical Exam   Wts  02/11/2023        ***  11/19/2021     154  05/14/2021       156  03/01/2021         155   06/26/17 164 lb (74.4 kg)  05/07/17 165 lb (74.8 kg)  05/06/17 164 lb 6.4 oz (74.6 kg)    Vital signs reviewed  02/11/2023  - Note at rest 02 sats  ***% on ***   General appearance:    ***     non-specific edema with slt mp secretions  Mild barr***               Assessment:

## 2023-02-11 ENCOUNTER — Ambulatory Visit (INDEPENDENT_AMBULATORY_CARE_PROVIDER_SITE_OTHER): Payer: 59

## 2023-02-11 ENCOUNTER — Ambulatory Visit (INDEPENDENT_AMBULATORY_CARE_PROVIDER_SITE_OTHER): Payer: 59 | Admitting: Internal Medicine

## 2023-02-11 ENCOUNTER — Other Ambulatory Visit: Payer: Self-pay | Admitting: Nurse Practitioner

## 2023-02-11 ENCOUNTER — Encounter: Payer: Self-pay | Admitting: Internal Medicine

## 2023-02-11 VITALS — BP 122/76 | HR 74 | Temp 98.7°F | Ht 67.0 in | Wt 158.6 lb

## 2023-02-11 DIAGNOSIS — Z87891 Personal history of nicotine dependence: Secondary | ICD-10-CM

## 2023-02-11 DIAGNOSIS — R918 Other nonspecific abnormal finding of lung field: Secondary | ICD-10-CM

## 2023-02-11 DIAGNOSIS — J4489 Other specified chronic obstructive pulmonary disease: Secondary | ICD-10-CM | POA: Diagnosis not present

## 2023-02-11 DIAGNOSIS — J449 Chronic obstructive pulmonary disease, unspecified: Secondary | ICD-10-CM

## 2023-02-11 MED ORDER — PREDNISONE 10 MG PO TABS: ORAL_TABLET | ORAL | 0 refills | Status: DC

## 2023-02-11 MED ORDER — AMOXICILLIN-POT CLAVULANATE 875-125 MG PO TABS: 1.0000 | ORAL_TABLET | Freq: Two times a day (BID) | ORAL | 0 refills | Status: DC

## 2023-02-11 MED ORDER — BENZONATATE 200 MG PO CAPS
200.0000 mg | ORAL_CAPSULE | Freq: Three times a day (TID) | ORAL | 1 refills | Status: DC | PRN
Start: 2023-02-11 — End: 2023-07-31

## 2023-02-11 NOTE — Patient Instructions (Addendum)
Try  Prilosec otc 20mg   Take 30-60 min before first meal of the day and Pepcid ac (famotidine) 20 mg one after supper until cough is completely gone for at least a week without the need for cough suppression  For cough > mucinex D 1200 mg every 12 hours as needed   and supplement with pearls 300 mg three times a day - need your license       Augmentin 875 mg take one pill twice daily  X 10 days - take at breakfast and supper with large glass of water.  It would help reduce the usual side effects (diarrhea and yeast infections) if you ate cultured yogurt at lunch.   Prednisone 10 mg take  4 each am x 2 days,   2 each am x 2 days,  1 each am x 2 days and stop   Work on inhaler technique:  relax and gently blow all the way out then take a nice smooth full deep breath back in, triggering the inhaler at same time you start breathing in.  Hold breath in for at least  5 seconds if you can. Blow out symbicort  thru nose. Rinse and gargle with water when done.  If mouth or throat bother you at all,  try brushing teeth/gums/tongue with arm and hammer toothpaste/ make a slurry and gargle and spit out.   GERD (REFLUX)  is an extremely common cause of respiratory symptoms just like yours , many times with no obvious heartburn at all.    It can be treated with medication, but also with lifestyle changes including elevation of the head of your bed (ideally with 6 -8inch blocks under the headboard of your bed),  Smoking cessation, avoidance of late meals, excessive alcohol, and avoid fatty foods, chocolate, peppermint, colas, red wine, and acidic juices such as orange juice.  NO MINT OR MENTHOL PRODUCTS SO NO COUGH DROPS  USE SUGARLESS CANDY INSTEAD (Jolley ranchers or Stover's or Life Savers) or even ice chips will also do - the key is to swallow to prevent all throat clearing. NO OIL BASED VITAMINS - use powdered substitutes.  Avoid fish oil when coughing.   Please remember to go to the  x-ray department  for  your tests - we will call you with the results when they are available    Please schedule a follow up visit in 12  months but call sooner if needed

## 2023-02-12 NOTE — Assessment & Plan Note (Signed)
Quit smoking 2019  06/26/2017 Quant GOLD TB > neg 06/27/2017 sputum for afb > neg smear PET 07/17/2017 Low-grade but abnormal activity associated with the solid portion of the cavitary lesion in the right upper lobe, maximum SUV 3.3, suspicious for malignancy. -  pfts 07/22/17 >  Nl x dlco 37 corrects to 50 > 07/23/2017 refer to T surgery > thoracoscopic right upper lobectomy and node dissection.  Final pathology showed a T1, N0, stage IA adenocarcinoma> no adjuvant rx   Presentation is now more c/w AB/ possibly rhinosinusitis with  bronchpna in R base with purulent sputum and she needs abx anywy for that so rec:  Augmentin 875 mg take one pill twice daily  X 10 days - take at breakfast and supper with large glass of water.  It would help reduce the usual side effects (diarrhea and yeast infections) if you ate cultured yogurt at lunch.   For cough > mucinex D 1200 mg every 12 hours as needed   and supplement with pearls 300 mg three times a day - need your license     Prednisone 10 mg take  4 each am x 2 days,   2 each am x 2 days,  1 each am x 2 days and stop   Work on inhaler technique:  F/u with LDSCT due anyway.

## 2023-02-12 NOTE — Assessment & Plan Note (Signed)
Quit 2019 > referred back to [facility] 02/11/2023 >>>  Discussed in detail all the  indications, usual  risks and alternatives  relative to the benefits with patient who agrees to proceed with w/u as outlined.            Each maintenance medication was reviewed in detail including emphasizing most importantly the difference between maintenance and prns and under what circumstances the prns are to be triggered using an action plan format where appropriate.  Total time for H and P, chart review, counseling, reviewing hfa device(s) and generating customized AVS unique to this office visit / same day charting = 30 min

## 2023-02-12 NOTE — Assessment & Plan Note (Signed)
Stopped smoking 2019  - -  pfts 07/22/17 >  Nl x dlco 37 corrects to 50 - 03/01/2021  continue symbicort 80 2bid and added max rx for gerd > resolved  - Allergy profile 03/01/21  >  Eos 0.1 /  IgE  10  >>>>  02/11/2023  After extensive coaching inhaler device,  effectiveness =    75% (short ti) > continue symbicort 80 2bid for now

## 2023-02-20 ENCOUNTER — Telehealth: Payer: Self-pay | Admitting: Internal Medicine

## 2023-02-20 NOTE — Telephone Encounter (Signed)
Patient has a yeast infection from the medication that was given to her by Dr.Wert and she would like some medication for it to clear it up.  Pharmacy: [facility] [address]

## 2023-02-21 MED ORDER — FLUCONAZOLE 100 MG PO TABS: 100.0000 mg | ORAL_TABLET | Freq: Every day | ORAL | 0 refills | Status: AC

## 2023-02-21 NOTE — Telephone Encounter (Signed)
Message sent and medication sent.

## 2023-02-21 NOTE — Telephone Encounter (Signed)
Diflucan 100 mg every day x 3 days

## 2023-02-21 NOTE — Telephone Encounter (Signed)
I called and spoke with the pt. Pt states that she now has a yeast infection caused by the Amoxicillin rx by Dr Sherene Sires. Pt states she has been on this since her lov with Dr Sherene Sires (02-11-23) Pt has not advised her PCP or went to her Gyn. For this. Pt would like a rx for the yeast infection. Pt stats the yogurt does not work for her. Please advise.

## 2023-03-11 ENCOUNTER — Institutional Professional Consult (permissible substitution) (INDEPENDENT_AMBULATORY_CARE_PROVIDER_SITE_OTHER): Payer: 59

## 2023-03-17 ENCOUNTER — Ambulatory Visit: Payer: Self-pay | Admitting: Nurse Practitioner

## 2023-03-17 NOTE — Telephone Encounter (Signed)
 Copied from CRM [id number]. Topic: Clinical - Red Word Triage >> Mar 17, 2023 11:13 AM Gurney Maxin H wrote: [facility] that prompted transfer to Nurse Triage: Sciatica pain on left side, leg feels like it's on fire going down the back of left leg, in pain can't walk.   Chief Complaint: left leg pain/possible sciatic nerve pain - recurrent  Symptoms: numbness with standing Frequency: ongoing since Saturday Pertinent Negatives: Patient denies shortness of breath  Disposition: [] ED /[x] Urgent Care (no appt availability in office) / [] Appointment(In office/virtual)/ []  [facility]/ [] Home Care/ [] Refused Recommended Disposition /[] [facility]/ []  Follow-up with PCP Additional Notes: The patient reported pain that starts at the top of her left buttock and radiates down her leg.  She reported numbness with standing.  The pain with standing is 10/10 and she has to sit down.  The pain worsened Sunday and Tylenol has been unhelpful.  She stated thatt she had similar pain in the past,, 2-3 years ago, and she was prescribed a muscle relaxant to ease the pain.  She declined a same day appointment or urgent care because she does not have transportation.  She was scheduled for a next day appointment with ED instructions if symptoms worsen.  Routed to pcp for awareness.   Reason for Disposition  [1] SEVERE pain (e.g., excruciating, unable to do any normal activities) AND [2] not improved after 2 hours of pain medicine  Answer Assessment - Initial Assessment Questions 1. ONSET: "When did the pain start?"      Started Saturday  2. LOCATION: "Where is the pain located?"      From top of left buttock down Left leg 3. PAIN: "How bad is the pain?"    (Scale 1-10; or mild, moderate, severe)   -  MILD (1-3): doesn\'t interfere with normal activities    -  MODERATE (4-7): interferes with normal activities (e.g., work or school) or awakens from sleep, limping    -  SEVERE (8-10): excruciating pain,  unable to do any normal activities, unable to walk     10 /10 - unable to stand  4. WORK OR EXERCISE: "Has there been any recent work or exercise that involved this part of the body?"      No sudden onset  5. CAUSE: "What do you think is causing the leg pain?"     Sciatic nerve pain 2-3 years ago  6. OTHER SYMPTOMS: "Do you have any other symptoms?" (e.g., chest pain, back pain, breathing difficulty, swelling, rash, fever, numbness, weakness)     Numbness when standing  Protocols used: Leg Pain-A-AH

## 2023-03-17 NOTE — Telephone Encounter (Signed)
 I called and spoke with patient and she said that she has an appointment with Dr. Doreene Burke tomorrow and just wanted Audrey Russell to be aware of what was going on.

## 2023-03-18 ENCOUNTER — Encounter: Payer: Self-pay | Admitting: Family Medicine

## 2023-03-18 ENCOUNTER — Ambulatory Visit (INDEPENDENT_AMBULATORY_CARE_PROVIDER_SITE_OTHER): Payer: 59 | Admitting: Family Medicine

## 2023-03-18 VITALS — BP 118/78 | HR 95 | Temp 98.0°F | Ht 67.0 in | Wt 154.6 lb

## 2023-03-18 DIAGNOSIS — M5442 Lumbago with sciatica, left side: Secondary | ICD-10-CM | POA: Diagnosis not present

## 2023-03-18 MED ORDER — GABAPENTIN 100 MG PO CAPS: ORAL_CAPSULE | ORAL | 0 refills | Status: DC

## 2023-03-18 MED ORDER — PREDNISONE 10 MG PO TABS: ORAL_TABLET | ORAL | 0 refills | Status: DC

## 2023-03-18 NOTE — Progress Notes (Signed)
 Established Patient Office Visit   Subjective:  Patient ID: Audrey Russell, female    DOB: 25-Dec-1955  Age: 68 y.o. MRN: [id number]  Chief Complaint  Patient presents with   Sciatica    Lefts    HPI Encounter Diagnoses  Name Primary?   Acute left-sided low back pain with left-sided sciatica Yes   2 to 3-day history of pain in her left lower back that is moving into the back of her left leg down to her knee and around to the front of her left leg.  There is no numbness or tingling.  Denies paresthesias in the saddle area.  No bowel or bladder incontinence.  No prior history of low back pain no specific injury.  A1c was 9  3 months ago.  She has since corrected her diet and avoids simple carbohydrates.  She has been taking her metformin twice daily as directed.   Review of Systems  Constitutional: Negative.   HENT: Negative.    Eyes:  Negative for blurred vision, discharge and redness.  Respiratory: Negative.    Cardiovascular: Negative.   Gastrointestinal:  Negative for abdominal pain.  Genitourinary: Negative.   Musculoskeletal:  Positive for back pain. Negative for myalgias.  Skin:  Negative for rash.  Neurological:  Negative for tingling, loss of consciousness and weakness.  Endo/Heme/Allergies:  Negative for polydipsia.     Current Outpatient Medications:    acetaminophen (TYLENOL) 500 MG tablet, Take 1,000 mg by mouth every 6 (six) hours as needed for mild pain or moderate pain., Disp: , Rfl:    aspirin EC 81 MG tablet, Take 1 tablet (81 mg total) by mouth daily., Disp: 90 tablet, Rfl: 3   blood glucose meter kit and supplies, One Touch Contour Monitor. Check glucose in AM, before breakfast. E11.65, Disp: 1 each, Rfl: 0   budesonide-formoterol (SYMBICORT) 80-4.5 MCG/ACT inhaler, INHALE 2 PUFFS INTO THE LUNGS DAILY. RINSE MOUTH AFTER EACH USE, Disp: 10.2 each, Rfl: 5   cetirizine (ZYRTEC) 10 MG tablet, Take 1 tablet (10 mg total) by mouth at bedtime., Disp: 30  tablet, Rfl: 0   citalopram (CELEXA) 40 MG tablet, TAKE 1 TABLET BY MOUTH EVERY DAY, Disp: 90 tablet, Rfl: 3   ezetimibe (ZETIA) 10 MG tablet, TAKE 1 TABLET BY MOUTH EVERY DAY IN THE EVENING, Disp: 90 tablet, Rfl: 2   famotidine (PEPCID) 20 MG tablet, TAKE 1 TABLET BY MOUTH EVERY DAY, Disp: 90 tablet, Rfl: 1   fenofibrate 160 MG tablet, TAKE 1 TABLET BY MOUTH EVERY DAY, Disp: 90 tablet, Rfl: 1   fluticasone (FLONASE) 50 MCG/ACT nasal spray, SPRAY 2 SPRAYS INTO EACH NOSTRIL EVERY DAY, Disp: 48 mL, Rfl: 1   gabapentin (NEURONTIN) 100 MG capsule, Take 1 capsule (100 mg total) by mouth at bedtime for 1 day, THEN 1 capsule (100 mg total) 2 (two) times daily for 4 days, THEN 1 capsule (100 mg total) 3 (three) times daily for 14 days., Disp: 90 capsule, Rfl: 0   glucose blood (CONTOUR NEXT TEST) test strip, 1 each by Other route as needed for other. Use as instructed, Disp: , Rfl:    glucose blood (CONTOUR NEXT TEST) test strip, Check glucose before breakfast. E11.65, Disp: 100 each, Rfl: 11   metFORMIN (GLUCOPHAGE) 850 MG tablet, Take 1 tablet (850 mg total) by mouth 2 (two) times daily with a meal., Disp: 180 tablet, Rfl: 1   mirtazapine (REMERON) 30 MG tablet, TAKE 1 TABLET BY MOUTH EVERYDAY AT BEDTIME, Disp: 90  tablet, Rfl: 3   ONETOUCH ULTRA TEST test strip, USE AS INSTRUCTED, Disp: 50 strip, Rfl: 0   pantoprazole (PROTONIX) 40 MG tablet, TAKE 1 TABLET (40 MG TOTAL) BY MOUTH DAILY. TAKE 30-60 MIN BEFORE FIRST MEAL OF THE DAY, Disp: 90 tablet, Rfl: 3   rosuvastatin (CRESTOR) 10 MG tablet, Take 1 tablet (10 mg total) by mouth daily., Disp: 90 tablet, Rfl: 3   sodium chloride (OCEAN) 0.65 % SOLN nasal spray, Place 1 spray into both nostrils as needed for congestion., Disp: 15 mL, Rfl: 0   tiZANidine (ZANAFLEX) 4 MG tablet, Take 1 tablet (4 mg total) by mouth every 6 (six) hours as needed for muscle spasms., Disp: 30 tablet, Rfl: 0   VENTOLIN HFA 108 (90 Base) MCG/ACT inhaler, INHALE 1 TO 2 PUFFS INTO THE  LUNGS EVERY 6 HOURS AS NEEDED FOR WHEEZING OR SHORTNESS OF BREATH, Disp: 18 g, Rfl: 1   amoxicillin-clavulanate (AUGMENTIN) 875-125 MG tablet, Take 1 tablet by mouth 2 (two) times daily. (Patient not taking: Reported on 03/18/2023), Disp: 20 tablet, Rfl: 0   benzonatate (TESSALON) 200 MG capsule, Take 1 capsule (200 mg total) by mouth 3 (three) times daily as needed. (Patient not taking: Reported on 03/18/2023), Disp: 21 capsule, Rfl: 0   benzonatate (TESSALON) 200 MG capsule, Take 1 capsule (200 mg total) by mouth 3 (three) times daily as needed for cough. (Patient not taking: Reported on 03/18/2023), Disp: 30 capsule, Rfl: 1   predniSONE (DELTASONE) 10 MG tablet, Take  4 each am x 2 days,   2 each am x 2 days,  1 each am x 2 days and stop, Disp: 14 tablet, Rfl: 0   Objective:     BP 118/78   Pulse 95   Temp 98 F (36.7 C)   Ht 5\' 7"  (1.702 m)   Wt 154 lb 9.6 oz (70.1 kg)   SpO2 95%   BMI 24.21 kg/m    Physical Exam Vitals reviewed: smells of tobacco..  Constitutional:      General: She is not in acute distress.    Appearance: Normal appearance. She is not ill-appearing, toxic-appearing or diaphoretic.  HENT:     Head: Normocephalic and atraumatic.     Right Ear: External ear normal.     Left Ear: External ear normal.  Eyes:     General: No scleral icterus.       Right eye: No discharge.        Left eye: No discharge.     Extraocular Movements: Extraocular movements intact.     Conjunctiva/sclera: Conjunctivae normal.  Pulmonary:     Effort: Pulmonary effort is normal. No respiratory distress.  Musculoskeletal:     Lumbar back: Bony tenderness (not reproducible along lumbar spine.) present. Decreased range of motion (flexion to 2 feet. Extension reduced. rotation okay.). Negative right straight leg raise test and negative left straight leg raise test.  Skin:    General: Skin is warm and dry.  Neurological:     Mental Status: She is alert and oriented to person, place, and  time.     Motor: No weakness.     Deep Tendon Reflexes:     Reflex Scores:      Patellar reflexes are 2+ on the right side and 2+ on the left side.      Achilles reflexes are 1+ on the right side and 1+ on the left side. Psychiatric:        Mood and Affect: Mood normal.  Behavior: Behavior normal.      No results found for any visits on 03/18/23.    The ASCVD Risk score (Arnett DK, et al., 2019) failed to calculate for the following reasons:   The valid total cholesterol range is 130 to 320 mg/dL    Assessment & Plan:   Acute left-sided low back pain with left-sided sciatica -     Gabapentin; Take 1 capsule (100 mg total) by mouth at bedtime for 1 day, THEN 1 capsule (100 mg total) 2 (two) times daily for 4 days, THEN 1 capsule (100 mg total) 3 (three) times daily for 14 days.  Dispense: 90 capsule; Refill: 0 -     predniSONE; Take  4 each am x 2 days,   2 each am x 2 days,  1 each am x 2 days and stop  Dispense: 14 tablet; Refill: 0 -     DG Lumbar Spine Complete; Future    Return Schedule follow-up with Claris Gower in a few weeks.Mliss Sax, MD

## 2023-03-19 ENCOUNTER — Other Ambulatory Visit: Payer: Self-pay | Admitting: Family Medicine

## 2023-03-19 DIAGNOSIS — M5442 Lumbago with sciatica, left side: Secondary | ICD-10-CM

## 2023-03-26 ENCOUNTER — Ambulatory Visit (INDEPENDENT_AMBULATORY_CARE_PROVIDER_SITE_OTHER)
Admission: RE | Admit: 2023-03-26 | Discharge: 2023-03-26 | Disposition: A | Discharge status: Home / Self Care | Source: Ambulatory Visit | Attending: Family Medicine | Admitting: Family Medicine

## 2023-03-26 DIAGNOSIS — M5442 Lumbago with sciatica, left side: Secondary | ICD-10-CM

## 2023-04-07 ENCOUNTER — Other Ambulatory Visit: Payer: Self-pay | Admitting: Emergency Medicine

## 2023-04-07 ENCOUNTER — Telehealth: Payer: Self-pay | Admitting: Acute Care

## 2023-04-07 DIAGNOSIS — Z87891 Personal history of nicotine dependence: Secondary | ICD-10-CM

## 2023-04-07 DIAGNOSIS — Z122 Encounter for screening for malignant neoplasm of respiratory organs: Secondary | ICD-10-CM

## 2023-04-07 NOTE — Telephone Encounter (Addendum)
 Lung Cancer Screening Narrative/Criteria Questionnaire (Cigarette Smokers Only- No Cigars/Pipes/vapes)   Audrey Russell   SDMV: RESCHEDULED - 08/15/2023 10:15am [place]    [birth year]   LDCT: RESCHEDULED -08/19/2023 2:00pm -[facility]     68 y.o.   Phone: [phone]  Lung Screening Narrative (confirm age 58-77 yrs Medicare / 50-80 yrs Private pay insurance)   Insurance information:[employer]   Referring Provider:Dr. Darlean   This screening involves an initial phone call with a team member from our program. It is called a shared decision making visit. The initial meeting is required by  insurance and Medicare to make sure you understand the program. This appointment takes about 15-20 minutes to complete. You will complete the screening scan at your scheduled date/time.  This scan takes about 5-10 minutes to complete. You can eat and drink normally before and after the scan.  Criteria questions for Lung Cancer Screening:   Are you a current or former smoker? Former Age began smoking: 2019   If you are a former smoker, what year did you quit smoking? 2019(within 15 yrs)   To calculate your smoking history, I need an accurate estimate of how many packs of cigarettes you smoked per day and for how many years. (Not just the number of PPD you are now smoking)   Years smoking 44 x Packs per day 3/4 = Pack years 33   (at least 20 pack yrs)   (Make sure they understand that we need to know how much they have smoked in the past, not just the number of PPD they are smoking now)  Do you have a personal history of cancer?  Yes - (type and when diagnosed - 5 yrs cancer free) Lung cancer - diagnosed in 2019 with lobectomy - successful.     Do you have a family history of cancer? No  Are you coughing up blood?  No  Have you had unexplained weight loss of 15 lbs or more in the last 6 months? No  It looks like you meet all criteria.  When would be a good time for us  to schedule you for this  screening?   Additional information: N/A

## 2023-04-14 ENCOUNTER — Encounter: Payer: Self-pay | Admitting: Family Medicine

## 2023-04-17 ENCOUNTER — Other Ambulatory Visit: Payer: Self-pay | Admitting: Nurse Practitioner

## 2023-04-17 DIAGNOSIS — F325 Major depressive disorder, single episode, in full remission: Secondary | ICD-10-CM

## 2023-04-18 ENCOUNTER — Other Ambulatory Visit: Payer: Self-pay | Admitting: Nurse Practitioner

## 2023-04-18 DIAGNOSIS — H6993 Unspecified Eustachian tube disorder, bilateral: Secondary | ICD-10-CM

## 2023-04-24 ENCOUNTER — Encounter: Payer: Self-pay | Admitting: Nurse Practitioner

## 2023-04-24 ENCOUNTER — Encounter: Admitting: Nurse Practitioner

## 2023-04-25 NOTE — Progress Notes (Signed)
 Se opted to leave prior to my evaluation. States her son (driver) had another appointment.  This encounter was created in error - please disregard.

## 2023-05-01 ENCOUNTER — Encounter: Admitting: Adult Health

## 2023-05-02 ENCOUNTER — Other Ambulatory Visit

## 2023-05-04 ENCOUNTER — Other Ambulatory Visit: Payer: Self-pay | Admitting: Nurse Practitioner

## 2023-05-04 DIAGNOSIS — E1165 Type 2 diabetes mellitus with hyperglycemia: Secondary | ICD-10-CM

## 2023-05-21 ENCOUNTER — Encounter (INDEPENDENT_AMBULATORY_CARE_PROVIDER_SITE_OTHER): Payer: Self-pay

## 2023-05-21 ENCOUNTER — Ambulatory Visit (INDEPENDENT_AMBULATORY_CARE_PROVIDER_SITE_OTHER): Payer: 59 | Admitting: Otolaryngology

## 2023-05-21 VITALS — BP 158/82 | HR 100 | Ht 67.0 in | Wt 151.0 lb

## 2023-05-21 DIAGNOSIS — H6981 Other specified disorders of Eustachian tube, right ear: Secondary | ICD-10-CM | POA: Diagnosis not present

## 2023-05-21 DIAGNOSIS — H6521 Chronic serous otitis media, right ear: Secondary | ICD-10-CM

## 2023-05-21 DIAGNOSIS — R0981 Nasal congestion: Secondary | ICD-10-CM | POA: Diagnosis not present

## 2023-05-21 DIAGNOSIS — H6122 Impacted cerumen, left ear: Secondary | ICD-10-CM

## 2023-05-21 DIAGNOSIS — J310 Chronic rhinitis: Secondary | ICD-10-CM | POA: Diagnosis not present

## 2023-05-22 DIAGNOSIS — J310 Chronic rhinitis: Secondary | ICD-10-CM | POA: Insufficient documentation

## 2023-05-22 DIAGNOSIS — H6981 Other specified disorders of Eustachian tube, right ear: Secondary | ICD-10-CM | POA: Insufficient documentation

## 2023-05-22 DIAGNOSIS — H6122 Impacted cerumen, left ear: Secondary | ICD-10-CM | POA: Insufficient documentation

## 2023-05-22 DIAGNOSIS — H6521 Chronic serous otitis media, right ear: Secondary | ICD-10-CM | POA: Insufficient documentation
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
## 2023-05-23 ENCOUNTER — Other Ambulatory Visit: Payer: Self-pay | Admitting: Nurse Practitioner

## 2023-05-23 DIAGNOSIS — J449 Chronic obstructive pulmonary disease, unspecified: Secondary | ICD-10-CM

## 2023-05-23 NOTE — Telephone Encounter (Unsigned)
 Copied from CRM [id number]. Topic: Clinical - Medication Refill >> May 23, 2023  2:15 PM Jethro Morrison wrote: Most Recent Primary Care Visit:  Provider: Tonna Frederic  Department: [facility]  Visit Type: ACUTE  Date: 03/18/2023  Medication: budesonide -formoterol  (SYMBICORT ) 80-4.5 MCG/ACT inhaler   Has the patient contacted their pharmacy? Yes (Agent: If no, request that the patient contact the pharmacy for the refill. If patient does not wish to contact the pharmacy document the reason why and proceed with request.) (Agent: If yes, when and what did the pharmacy advise?)  Is this the correct pharmacy for this prescription? Yes If no, delete pharmacy and type the correct one.  This is the patient's preferred pharmacy:  [id number] - [place], [place] - [address] AT [facility] OF [address] [place] [address] Phone: [phone] Fax: [phone]     Has the prescription been filled recently? Yes  Is the patient out of the medication? Yes  Has the patient been seen for an appointment in the last year OR does the patient have an upcoming appointment? Yes  Can we respond through MyChart? Yes  Agent: Please be advised that Rx refills may take up to 3 business days. We ask that you follow-up with your pharmacy.

## 2023-05-26 NOTE — Telephone Encounter (Signed)
 Last Fill: 12/18/22  Last OV: 03/18/23 ACUTE Next OV: 06/02/23  Routing to provider for review/authorization.

## 2023-05-27 MED ORDER — BUDESONIDE-FORMOTEROL FUMARATE 80-4.5 MCG/ACT IN AERO: INHALATION_SPRAY | RESPIRATORY_TRACT | 5 refills | Status: DC

## 2023-05-28 ENCOUNTER — Telehealth (INDEPENDENT_AMBULATORY_CARE_PROVIDER_SITE_OTHER): Payer: Self-pay | Admitting: Otolaryngology

## 2023-05-29 ENCOUNTER — Ambulatory Visit (INDEPENDENT_AMBULATORY_CARE_PROVIDER_SITE_OTHER): Admitting: Otolaryngology

## 2023-05-29 VITALS — Ht 67.0 in | Wt 151.0 lb

## 2023-05-29 DIAGNOSIS — H6521 Chronic serous otitis media, right ear: Secondary | ICD-10-CM

## 2023-05-29 DIAGNOSIS — H6981 Other specified disorders of Eustachian tube, right ear: Secondary | ICD-10-CM | POA: Diagnosis not present

## 2023-06-01 NOTE — Progress Notes (Signed)
 Procedure: Right myringotomy and tube placement.   Indication: Eustachian tube dysfunction and middle ear effusion, not responding to medical treatment.    Descriptions: The patient was placed supine on the exam table.  Under the operating microscope, the right ear canal was cleaned of all cerumen.  1% lidocaine  was injected into all four quadrants of the ear canal. After adequate local anesthesia was achieved, a standard myringotomy incision was made at the anterior inferior quadrant of the tympanic membrane.  A scant amount of serous fluid was suctioned from behind the tympanic membrane.  A collar button tube was placed without difficulty.  The patient tolerated the procedure well.    Follow-up care: Dry ear precaution. The patient will return for reevaluation in approximately one month.

## 2023-06-02 ENCOUNTER — Ambulatory Visit: Payer: 59 | Admitting: Nurse Practitioner

## 2023-06-17 ENCOUNTER — Other Ambulatory Visit: Payer: Self-pay | Admitting: Nurse Practitioner

## 2023-06-17 DIAGNOSIS — J449 Chronic obstructive pulmonary disease, unspecified: Secondary | ICD-10-CM

## 2023-07-19 ENCOUNTER — Other Ambulatory Visit: Payer: Self-pay | Admitting: Nurse Practitioner

## 2023-07-19 DIAGNOSIS — H6993 Unspecified Eustachian tube disorder, bilateral: Secondary | ICD-10-CM

## 2023-07-24 ENCOUNTER — Ambulatory Visit (INDEPENDENT_AMBULATORY_CARE_PROVIDER_SITE_OTHER): Admitting: Otolaryngology

## 2023-07-24 VITALS — BP 148/71 | HR 82

## 2023-07-24 DIAGNOSIS — Z8669 Personal history of other diseases of the nervous system and sense organs: Secondary | ICD-10-CM | POA: Diagnosis not present

## 2023-07-24 DIAGNOSIS — Z09 Encounter for follow-up examination after completed treatment for conditions other than malignant neoplasm: Secondary | ICD-10-CM

## 2023-07-24 DIAGNOSIS — J310 Chronic rhinitis: Secondary | ICD-10-CM

## 2023-07-24 DIAGNOSIS — R0981 Nasal congestion: Secondary | ICD-10-CM

## 2023-07-24 DIAGNOSIS — H6981 Other specified disorders of Eustachian tube, right ear: Secondary | ICD-10-CM | POA: Diagnosis not present

## 2023-07-24 DIAGNOSIS — Z9629 Presence of other otological and audiological implants: Secondary | ICD-10-CM | POA: Diagnosis not present

## 2023-07-24 DIAGNOSIS — H7201 Central perforation of tympanic membrane, right ear: Secondary | ICD-10-CM

## 2023-07-25 DIAGNOSIS — H7201 Central perforation of tympanic membrane, right ear: Secondary | ICD-10-CM | POA: Insufficient documentation

## 2023-07-25 NOTE — Progress Notes (Signed)
 Patient ID: Audrey Russell, female   DOB: 02/28/1955, 68 y.o.   MRN: [id number]  Follow-up: Right ear eustachian tube dysfunction, right middle ear effusion  HPI: The patient is a 68 year old female who returns today for follow-up evaluation.  The patient has a history of chronic right ear eustachian tube dysfunction and recurrent right middle ear effusion.  Her previous nasal endoscopy examination was negative for nasopharyngeal mass.  She underwent right myringotomy and tube placement 1 month ago.  The patient returns today reporting significant improvement in her right ear hearing.  She denies any otalgia, otorrhea, or vertigo.  Exam: General: Communicates without difficulty, well nourished, no acute distress. Head: Normocephalic, no evidence injury, no tenderness, facial buttresses intact without stepoff. Face/sinus: No tenderness to palpation and percussion. Facial movement is normal and symmetric. Eyes: PERRL, EOMI. No scleral icterus, conjunctivae clear. Neuro: CN II exam reveals vision grossly intact.  No nystagmus at any point of gaze. Ears: Auricles well formed without lesions.  Ear canals are intact without mass or lesion.  No erythema or edema is appreciated.  The right ventilating tube is in place and patent.  The left ear canal and tympanic membrane are normal.  Nose: External evaluation reveals normal support and skin without lesions.  Dorsum is intact.  Anterior rhinoscopy reveals congested mucosa over anterior aspect of inferior turbinates and intact septum.  No purulence noted. Oral:  Oral cavity and oropharynx are intact, symmetric, without erythema or edema.  Mucosa is moist without lesions. Neck: Full range of motion without pain.  There is no significant lymphadenopathy.  No masses palpable.  Thyroid  bed within normal limits to palpation.  Parotid glands and submandibular glands equal bilaterally without mass.  Trachea is midline. Neuro:  CN 2-12 grossly intact.    Assessment: 1.  Chronic rhinitis with nasal mucosal congestion and right eustachian tube dysfunction. 2.  The right ventilating tube is in place and patent.  No drainage is noted. 3.  Her right ear hearing has improved.  Plan: 1.  The physical exam findings are reviewed with the patient. 2.  Continue with Flonase  nasal spray 2 sprays each nostril daily. 3.  Dry ear precautions on the right side. 4.  The patient will return for reevaluation in 6 months.  Will repeat her hearing test at that time.

## 2023-07-29 NOTE — Progress Notes (Addendum)
 Subjective:     Patient ID: Audrey Russell, female   DOB: [birth year]     MRN: [id number]    Brief patient profile:  59 yobf from [place]  quit smoking 2019 moved to [place] 2016 with cough/doe x fall   2017referred to pulmonary clinic 06/26/2017 by Dr   Katheen with wt loss, night sweats and cavitary lesion in RUL post segment.    06/26/2017 1st [facility] office visit/ Liat Mayol   Chief Complaint  Patient presents with   Pulmonary Consult    Referred by Dr. Roselie Katheen for eval of abnormal ct chest done 06/12/17.  She states she has been having SOB for the past 1-2 yrs. She states she coughs constantly- started back in Sept 2018 but has been getting progressively worse. Her cough is occ prod with clear to yellow sputum.    initially started with cough fall 2017 thick yellow / green phlegm no better with abx and never bloody worse in early am and up to a small cup in vol in am reported   Doe= MMRC3 = can't walk 100 yards even at a slow pace at a flat grade s stopping due to sob   Rec Pantoprazole  (protonix ) 40 mg   Take  30-60 min before first meal of the day and Pepcid  (famotidine )  20 mg one after supper until return to office GERD diet reviewed, bed blocks  Only use your albuterol  as a rescue medication     07/23/2017 T surgery > thoracoscopic right upper lobectomy and node dissection.  Final pathology showed a T1, N0, stage IA adenocarcinoma> no adjuvant rx      11/19/2021  f/u ov/Keivon Garden re: AB   maint on symbicort  80 2bid x 2 weeks worse  No chief complaint on file.   Dyspnea:  still walking neighborhood, slow pace  Cough: yellow worse in ams x sev weeks Sleeping: 45 degrees with pillows SABA use: 3 x since sick 02: none  Covid status:   vax 3rd complete 2 weeks  Lung cancer screening :  being followed  by [facility]  Rec Try prilosec otc 20mg   Take 30-60 min before first meal of the day and Pepcid  ac (famotidine ) 20 mg one after supper until cough is completely gone for  at least a week without the need for cough suppression For cough > mucinex  dm 1200 mg every 12 hours as needed   Augmentin  875 mg take one pill twice daily  X 10 days  Prednisone  10 mg take  4 each am x 2 days,   2 each am x 2 days,  1 each am x 2 days and stop  Only use your albuterol  as a rescue medication        02/11/2023  f/u ov/Niajah Sipos re: AB    maint on symbicort  80  s reflux rx and fine until 2 months prior to OV   Chief Complaint  Patient presents with   Follow-up    Cough all day x 2 months.  Also c/o Wheeze and SOB  Dyspnea:  more sob since cough onset   Cough: variably yellow / assoc nasal congestion  Sleeping: flat bed flat pillow variable at hs  resp cc  SABA use: up to 3 x per day  02: none  Lung cancer screening :  referred back for [facility]  Rec Try  Prilosec otc 20mg   Take 30-60 min before first meal of the day and Pepcid  ac (famotidine ) 20 mg one after supper until  cough is completely gone for at least a week without the need for cough suppression For cough > mucinex  D 1200 mg every 12 hours as needed   and supplement with pearls 300 mg three times a day - need your license   Augmentin  875 mg take one pill twice daily  X 10 days -  Prednisone  10 mg take  4 each am x 2 days,   2 each am x 2 days,  1 each am x 2 days and stop  Work on inhaler technique:  GERD diet reviewed, bed blocks rec   Cxr no change    Please schedule a follow up visit in 12  months but call sooner if needed    07/31/2023  f/u ov/Avory Mimbs re: AB   maint on symbicort  80  / did not reinsitute otc's above Chief Complaint  Patient presents with   Follow-up  Dyspnea:  was better p cough resolved, back again since onset of cough  Cough: thick yellow x 2 months coughing to point of vomit  Sleeping: waking up 2-3 x times flat bed big pillow  resp cc  SABA use: typically before walks and noct  02: none   Lung cancer screening :  referred again 07/31/2023     No obvious day to day or daytime variability  or assoc   mucus plugs or hemoptysis or cp or chest tightness, subjective wheeze or overt   hb symptoms (other than cough to vomit)    Also denies any obvious fluctuation of symptoms with weather or environmental changes or other aggravating or alleviating factors except as outlined above   No unusual exposure hx or h/o childhood pna/ asthma or knowledge of premature birth.  Current Allergies, Complete Past Medical History, Past Surgical History, Family History, and Social History were reviewed in [employer] record.  ROS  The following are not active complaints unless bolded Hoarseness, sore throat, dysphagia, dental problems, itching, sneezing,  nasal congestion or discharge of excess mucus or purulent secretions, ear ache,   fever, chills, sweats, unintended wt loss or wt gain, classically pleuritic or exertional cp,  orthopnea pnd or arm/hand swelling  or leg swelling, presyncope, palpitations, abdominal pain, anorexia, nausea, vomiting, diarrhea  or change in bowel habits or change in bladder habits, change in stools or change in urine, dysuria, hematuria,  rash, arthralgias, visual complaints, headache, numbness, weakness or ataxia or problems with walking or coordination,  change in mood or  memory.        Current Meds  Medication Sig   blood glucose meter kit and supplies One Touch Contour Monitor. Check glucose in AM, before breakfast. E11.65   budesonide -formoterol  (SYMBICORT ) 80-4.5 MCG/ACT inhaler INHALE 2 PUFFS INTO THE LUNGS DAILY. RINSE MOUTH AFTER EACH USE   citalopram  (CELEXA ) 40 MG tablet TAKE 1 TABLET BY MOUTH EVERY DAY   ezetimibe  (ZETIA ) 10 MG tablet TAKE 1 TABLET BY MOUTH EVERY DAY IN THE EVENING   famotidine  (PEPCID ) 20 MG tablet TAKE 1 TABLET BY MOUTH EVERY DAY   fenofibrate  160 MG tablet TAKE 1 TABLET BY MOUTH EVERY DAY   fluticasone  (FLONASE ) 50 MCG/ACT nasal spray SHAKE LIQUID AND USE 2 SPRAYS IN EACH NOSTRIL EVERY DAY   metFORMIN  (GLUCOPHAGE )  850 MG tablet TAKE 1 TABLET(850 MG) BY MOUTH TWICE DAILY WITH A MEAL   mirtazapine  (REMERON ) 30 MG tablet TAKE 1 TABLET BY MOUTH EVERYDAY AT BEDTIME   ONETOUCH ULTRA TEST test strip USE AS INSTRUCTED   rosuvastatin  (  CRESTOR ) 10 MG tablet Take 1 tablet (10 mg total) by mouth daily.   VENTOLIN  HFA 108 (90 Base) MCG/ACT inhaler INHALE 1 TO 2 PUFFS INTO THE LUNGS EVERY 6 HOURS AS NEEDED FOR WHEEZING OR SHORTNESS OF BREATH                   Objective:   Physical Exam   Wts  07/31/2023        147   02/11/2023       158  11/19/2021     154  05/14/2021       156  03/01/2021         155   06/26/17 164 lb (74.4 kg)  05/07/17 165 lb (74.8 kg)  05/06/17 164 lb 6.4 oz (74.6 kg)   Vital signs reviewed  07/31/2023  - Note at rest 02 sats  96% on RA   General appearance:    uncomfortable and frustrated amb bf but no increased sob/ not toxic appearing at all.    HEENT : Oropharynx  clear   Nasal turbinates MP secretions bilaterally   NECK :  without  apparent JVD/ palpable Nodes/TM    LUNGS: no acc muscle use,  Mild barrel  contour chest wall with bilateral  insp/exp rhonchi  and  without cough on insp or exp maneuvers  and mild  Hyperresonant  to  percussion bilaterally     CV:  RRR  no s3 or murmur or increase in P2, and no edema   ABD:  soft and nontender with pos end  insp Hoover's  in the supine position.  No bruits or organomegaly appreciated   MS:  Nl gait/ ext warm without deformities Or obvious joint restrictions  calf tenderness, cyanosis or clubbing     SKIN: warm and dry without lesions    NEURO:  alert, approp, nl sensorium with  no motor or cerebellar deficits apparent.         Assessment:

## 2023-07-31 ENCOUNTER — Ambulatory Visit: Admitting: Nurse Practitioner

## 2023-07-31 ENCOUNTER — Ambulatory Visit (INDEPENDENT_AMBULATORY_CARE_PROVIDER_SITE_OTHER): Admitting: Internal Medicine

## 2023-07-31 ENCOUNTER — Encounter: Payer: Self-pay | Admitting: Internal Medicine

## 2023-07-31 ENCOUNTER — Encounter: Payer: Self-pay | Admitting: Nurse Practitioner

## 2023-07-31 VITALS — BP 122/64 | HR 92 | Temp 97.7°F | Ht 67.0 in | Wt 147.2 lb

## 2023-07-31 VITALS — BP 136/78 | HR 86 | Temp 98.3°F | Ht 67.0 in | Wt 146.8 lb

## 2023-07-31 DIAGNOSIS — E1169 Type 2 diabetes mellitus with other specified complication: Secondary | ICD-10-CM | POA: Diagnosis not present

## 2023-07-31 DIAGNOSIS — Z7984 Long term (current) use of oral hypoglycemic drugs: Secondary | ICD-10-CM

## 2023-07-31 DIAGNOSIS — Z87891 Personal history of nicotine dependence: Secondary | ICD-10-CM | POA: Diagnosis not present

## 2023-07-31 DIAGNOSIS — R058 Other specified cough: Secondary | ICD-10-CM

## 2023-07-31 DIAGNOSIS — G35 Multiple sclerosis: Secondary | ICD-10-CM | POA: Diagnosis not present

## 2023-07-31 DIAGNOSIS — J4489 Other specified chronic obstructive pulmonary disease: Secondary | ICD-10-CM | POA: Diagnosis not present

## 2023-07-31 DIAGNOSIS — E785 Hyperlipidemia, unspecified: Secondary | ICD-10-CM | POA: Diagnosis not present

## 2023-07-31 DIAGNOSIS — I10 Essential (primary) hypertension: Secondary | ICD-10-CM | POA: Diagnosis not present

## 2023-07-31 LAB — LIPID PANEL
Cholesterol: 257 mg/dL — ABNORMAL HIGH (ref 0–200)
HDL: 38.3 mg/dL — ABNORMAL LOW (ref >39.00)
LDL Cholesterol: 172 mg/dL — ABNORMAL HIGH (ref 0–99)
NonHDL: 218.81
Total CHOL/HDL Ratio: 7
Triglycerides: 236 mg/dL — ABNORMAL HIGH (ref 0.0–149.0)
VLDL: 47.2 mg/dL — ABNORMAL HIGH (ref 0.0–40.0)

## 2023-07-31 LAB — COMPREHENSIVE METABOLIC PANEL WITH GFR
ALT: 16 U/L (ref 0–35)
AST: 20 U/L (ref 0–37)
Albumin: 4.8 g/dL (ref 3.5–5.2)
Alkaline Phosphatase: 73 U/L (ref 39–117)
BUN: 17 mg/dL (ref 6–23)
CO2: 27 meq/L (ref 19–32)
Calcium: 10.1 mg/dL (ref 8.4–10.5)
Chloride: 100 meq/L (ref 96–112)
Creatinine, Ser: 1.02 mg/dL (ref 0.40–1.20)
GFR: 56.9 mL/min — ABNORMAL LOW (ref >60.00)
Glucose, Bld: 129 mg/dL — ABNORMAL HIGH (ref 70–99)
Potassium: 4.1 meq/L (ref 3.5–5.1)
Sodium: 136 meq/L (ref 135–145)
Total Bilirubin: 0.5 mg/dL (ref 0.2–1.2)
Total Protein: 8.4 g/dL — ABNORMAL HIGH (ref 6.0–8.3)

## 2023-07-31 LAB — HEMOGLOBIN A1C: Hgb A1c MFr Bld: 9.1 % — ABNORMAL HIGH (ref 4.6–6.5)

## 2023-07-31 MED ORDER — PANTOPRAZOLE SODIUM 40 MG PO TBEC: 40.0000 mg | DELAYED_RELEASE_TABLET | Freq: Every day | ORAL | 2 refills | Status: DC

## 2023-07-31 MED ORDER — PREDNISONE 10 MG PO TABS: ORAL_TABLET | ORAL | 0 refills | Status: DC

## 2023-07-31 MED ORDER — FAMOTIDINE 20 MG PO TABS: ORAL_TABLET | ORAL | 11 refills | Status: DC

## 2023-07-31 MED ORDER — AMOXICILLIN-POT CLAVULANATE 875-125 MG PO TABS: 1.0000 | ORAL_TABLET | Freq: Two times a day (BID) | ORAL | 0 refills | Status: DC

## 2023-07-31 MED ORDER — ACETAMINOPHEN-CODEINE 300-30 MG PO TABS: 1.0000 | ORAL_TABLET | ORAL | 0 refills | Status: AC | PRN

## 2023-07-31 NOTE — Patient Instructions (Signed)
 Go to lab Maintain current medications.  Sit-to-Stand Exercise  The sit-to-stand exercise (also known as the chair stand or chair rise exercise) strengthens your lower body and helps you maintain or improve your mobility and independence. The end goal is to do the sit-to-stand exercise without using your hands. This will be easier as you become stronger. You should always talk with your health care provider before starting any exercise program, especially if you have had recent surgery. Do the exercise exactly as told by your health care provider and adjust it as directed. It is normal to feel mild stretching, pulling, tightness, or discomfort as you do this exercise, but you should stop right away if you feel sudden pain or your pain gets worse. Do not begin doing this exercise until told by your health care provider. What the sit-to-stand exercise does The sit-to-stand exercise helps to strengthen the muscles in your thighs and the muscles in the center of your body that give you stability (core muscles). This exercise is especially helpful if: You have had knee or hip surgery. You have trouble getting up from a chair, out of a car, or off the toilet due to muscle weakness. How to do the sit-to-stand exercise Sit toward the front edge of a sturdy chair without armrests. Your knees should be bent and your feet should be flat on the floor and shoulder-width apart and underneath your hips. Place your hands lightly on each side of the seat. Keep your back and neck as straight as possible, with your chest slightly forward. Breathe in slowly. Lean forward and slightly shift your weight to the front of your feet. Breathe out as you slowly stand up. Try not to support any weight with your hands. Stand and pause for a full breath in and out. Breathe in as you sit down slowly. Tighten your core and abdominal muscles to control your lowering as much as possible. You should lower yourself back to the chair  slowly, not just drop back into the seat. Breathe out slowly. Do this exercise 10-15 times. If needed, do it fewer times until you build up strength. Rest for 1 minute, then do another set of 10-15 repetitions. To change the difficulty of the sit-to-stand exercise If the exercise is too difficult, use a chair with sturdy armrests, and push off the armrests to help you come to the standing position. You can also use the armrests to help slowly lower yourself back to sitting. As this gets easier, try to use your arms less. You can also place a firm cushion or pillow on the chair to make the surface higher. If this exercise is too easy, do not use your arms to help raise or lower yourself. You can also wear a weighted vest, use hand weights, increase your repetitions, or try a lower chair. General tips You may feel tired when starting an exercise routine. This is normal. You may have muscle soreness that lasts a few days. This is normal. As you get stronger, you may not feel muscle soreness. Use smooth, steady movements. Do not  hold your breath during strength exercises. This can cause unsafe changes in your blood pressure. Breathe in slowly through your nose, and breathe out slowly through your mouth. Summary Strengthening your lower body is an important step to help you move safely and independently. The sit-to-stand exercise helps strengthen the muscles in your thighs and core. You should always talk with your health care provider before starting any exercise program, especially  if you have had recent surgery. This information is not intended to replace advice given to you by your health care provider. Make sure you discuss any questions you have with your health care provider. Document Revised: 04/30/2020 Document Reviewed: 04/30/2020 Elsevier Patient Education  2024 [employer].

## 2023-07-31 NOTE — Assessment & Plan Note (Signed)
 BP remains at goal BP Readings from Last 3 Encounters:  07/31/23 136/78  07/24/23 (!) 148/71  05/21/23 (!) 158/82

## 2023-07-31 NOTE — Addendum Note (Signed)
 Addended by: ALTO PARODY D on: 07/31/2023 02:48 PM   Modules accepted: Orders

## 2023-07-31 NOTE — Progress Notes (Signed)
 Established Patient Visit  Patient: Audrey Russell   DOB: 08-08-1955   68 y.o. Female  MRN: [id number] Visit Date: 07/31/2023  Subjective:    Chief Complaint  Patient presents with   Follow-up    6 month follow up for HTN, DM and hyperlipidemia    HPI MS (multiple sclerosis) (HCC) Reports progressive LE weakness and unsteady gait. Agreed to outpatient PT once cleared by pulmonology. Advised to start daily chair exercise. May also use resistant bands  DM (diabetes mellitus) (HCC) Current use of metformin  850mg  BID Unable to tolerate jardiance  due to recurrent vaginitis Unable to tolerate rybelsus  due to GI side effects: weight loss, ABDOMEN pain, nausea and vomiting  Normal foot exam Advised to schedule DIABETES eye exam  Repeat hgbA1c, UACr and CMP  Essential hypertension BP remains at goal BP Readings from Last 3 Encounters:  07/31/23 136/78  07/24/23 (!) 148/71  05/21/23 (!) 158/82   Wt Readings from Last 3 Encounters:  07/31/23 146 lb 12.8 oz (66.6 kg)  05/29/23 151 lb (68.5 kg)  05/21/23 151 lb (68.5 kg)    BP Readings from Last 3 Encounters:  07/31/23 136/78  07/24/23 (!) 148/71  05/21/23 (!) 158/82    Reviewed medical, surgical, and social history today  Medications: Outpatient Medications Prior to Visit  Medication Sig   aspirin  EC 81 MG tablet Take 1 tablet (81 mg total) by mouth daily.   blood glucose meter kit and supplies One Touch Contour Monitor. Check glucose in AM, before breakfast. E11.65   budesonide -formoterol  (SYMBICORT ) 80-4.5 MCG/ACT inhaler INHALE 2 PUFFS INTO THE LUNGS DAILY. RINSE MOUTH AFTER EACH USE   citalopram  (CELEXA ) 40 MG tablet TAKE 1 TABLET BY MOUTH EVERY DAY   ezetimibe  (ZETIA ) 10 MG tablet TAKE 1 TABLET BY MOUTH EVERY DAY IN THE EVENING   famotidine  (PEPCID ) 20 MG tablet TAKE 1 TABLET BY MOUTH EVERY DAY   fenofibrate  160 MG tablet TAKE 1 TABLET BY MOUTH EVERY DAY   fluticasone  (FLONASE ) 50 MCG/ACT  nasal spray SHAKE LIQUID AND USE 2 SPRAYS IN EACH NOSTRIL EVERY DAY   metFORMIN  (GLUCOPHAGE ) 850 MG tablet TAKE 1 TABLET(850 MG) BY MOUTH TWICE DAILY WITH A MEAL   mirtazapine  (REMERON ) 30 MG tablet TAKE 1 TABLET BY MOUTH EVERYDAY AT BEDTIME   ONETOUCH ULTRA TEST test strip USE AS INSTRUCTED   rosuvastatin  (CRESTOR ) 10 MG tablet Take 1 tablet (10 mg total) by mouth daily.   VENTOLIN  HFA 108 (90 Base) MCG/ACT inhaler INHALE 1 TO 2 PUFFS INTO THE LUNGS EVERY 6 HOURS AS NEEDED FOR WHEEZING OR SHORTNESS OF BREATH   [DISCONTINUED] acetaminophen  (TYLENOL ) 500 MG tablet Take 1,000 mg by mouth every 6 (six) hours as needed for mild pain or moderate pain. (Patient not taking: Reported on 07/31/2023)   [DISCONTINUED] amoxicillin -clavulanate (AUGMENTIN ) 875-125 MG tablet Take 1 tablet by mouth 2 (two) times daily. (Patient not taking: Reported on 07/31/2023)   [DISCONTINUED] benzonatate  (TESSALON ) 200 MG capsule Take 1 capsule (200 mg total) by mouth 3 (three) times daily as needed. (Patient not taking: Reported on 07/31/2023)   [DISCONTINUED] benzonatate  (TESSALON ) 200 MG capsule Take 1 capsule (200 mg total) by mouth 3 (three) times daily as needed for cough. (Patient not taking: Reported on 07/31/2023)   [DISCONTINUED] cetirizine  (ZYRTEC ) 10 MG tablet Take 1 tablet (10 mg total) by mouth at bedtime. (Patient not taking: Reported on 07/31/2023)   [DISCONTINUED] gabapentin  (NEURONTIN )  100 MG capsule Take 1 capsule (100 mg total) by mouth at bedtime for 1 day, THEN 1 capsule (100 mg total) 2 (two) times daily for 4 days, THEN 1 capsule (100 mg total) 3 (three) times daily for 14 days. (Patient not taking: No sig reported)   [DISCONTINUED] glucose blood (CONTOUR NEXT TEST) test strip 1 each by Other route as needed for other. Use as instructed   [DISCONTINUED] glucose blood (CONTOUR NEXT TEST) test strip Check glucose before breakfast. E11.65   [DISCONTINUED] pantoprazole  (PROTONIX ) 40 MG tablet TAKE 1 TABLET (40 MG  TOTAL) BY MOUTH DAILY. TAKE 30-60 MIN BEFORE FIRST MEAL OF THE DAY (Patient not taking: Reported on 07/31/2023)   [DISCONTINUED] predniSONE  (DELTASONE ) 10 MG tablet Take  4 each am x 2 days,   2 each am x 2 days,  1 each am x 2 days and stop (Patient not taking: Reported on 07/31/2023)   [DISCONTINUED] sodium chloride  (OCEAN) 0.65 % SOLN nasal spray Place 1 spray into both nostrils as needed for congestion. (Patient not taking: Reported on 07/31/2023)   [DISCONTINUED] tiZANidine  (ZANAFLEX ) 4 MG tablet Take 1 tablet (4 mg total) by mouth every 6 (six) hours as needed for muscle spasms. (Patient not taking: Reported on 07/31/2023)   No facility-administered medications prior to visit.   Reviewed past medical and social history.   ROS per HPI above      Objective:  BP 136/78 (BP Location: Left Arm, Patient Position: Sitting, Cuff Size: Normal)   Pulse 86   Temp 98.3 F (36.8 C) (Oral)   Ht 5' 7 (1.702 m)   Wt 146 lb 12.8 oz (66.6 kg)   SpO2 100%   BMI 22.99 kg/m      Physical Exam Vitals and nursing note reviewed.  Cardiovascular:     Rate and Rhythm: Normal rate and regular rhythm.     Pulses: Normal pulses.     Heart sounds: Normal heart sounds.  Pulmonary:     Effort: Pulmonary effort is normal.     Breath sounds: Normal breath sounds.  Neurological:     Mental Status: She is alert and oriented to person, place, and time.  Psychiatric:        Mood and Affect: Mood normal.        Behavior: Behavior normal.        Thought Content: Thought content normal.     No results found for any visits on 07/31/23.    Assessment & Plan:    Problem List Items Addressed This Visit     DM (diabetes mellitus) (HCC) - Primary   Current use of metformin  850mg  BID Unable to tolerate jardiance  due to recurrent vaginitis Unable to tolerate rybelsus  due to GI side effects: weight loss, ABDOMEN pain, nausea and vomiting  Normal foot exam Advised to schedule DIABETES eye exam  Repeat  hgbA1c, UACr and CMP      Relevant Orders   Microalbumin / creatinine urine ratio   Hemoglobin A1c   Comprehensive metabolic panel with GFR   Essential hypertension   BP remains at goal BP Readings from Last 3 Encounters:  07/31/23 136/78  07/24/23 (!) 148/71  05/21/23 (!) 158/82        Hyperlipidemia associated with type 2 diabetes mellitus (HCC)   Relevant Orders   Lipid panel   MS (multiple sclerosis) (HCC)   Reports progressive LE weakness and unsteady gait. Agreed to outpatient PT once cleared by pulmonology. Advised to start daily chair exercise. May  also use resistant bands      Return in about 6 months (around 01/31/2024) for HTN, DM, hyperlipidemia (fasting).     Roselie Mood, NP

## 2023-07-31 NOTE — Assessment & Plan Note (Addendum)
 Current use of metformin  850mg  BID Unable to tolerate jardiance  due to recurrent vaginitis Unable to tolerate rybelsus  due to GI side effects: weight loss, ABDOMEN pain, nausea and vomiting  Normal foot exam Advised to schedule DIABETES eye exam  Repeat hgbA1c, UACr and CMP

## 2023-07-31 NOTE — Patient Instructions (Addendum)
 Pantoprazole  (protonix ) 40 mg   Take  30-60 min before first meal of the day and Pepcid  (famotidine )  20 mg after supper until return to office - this is the best way to tell whether stomach acid is contributing to your problem.    For cough > mucinex  D 1200 mg every 12 hours as needed   and supplement with pearls 300 mg three times a day - need your license       Augmentin  875 mg take one pill twice daily  X 10 days - take at breakfast and supper with large glass of water.  It would help reduce the usual side effects (diarrhea and yeast infections) if you ate cultured yogurt at lunch.   Prednisone  10 mg take  4 each am x 2 days,   2 each am x 2 days,  1 each am x 2 days and stop   Work on inhaler technique:  relax and gently blow all the way out then take a nice smooth full deep breath back in, triggering the inhaler at same time you start breathing in.  Hold breath in for at least  5 seconds if you can. Blow out symbicort   thru nose. Rinse and gargle with water when done.  If mouth or throat bother you at all,  try brushing teeth/gums/tongue with arm and hammer toothpaste/ make a slurry and gargle and spit out.   For cough > tylenol  #3  1 every 4 hours as needed   I emphasized that nasal steroids (flonase )  have no immediate benefit in terms of improving symptoms.  To help them reached the target tissue, the patient should use Afrin two puffs every 12 hours applied one min before using the nasal steroids.  Afrin should be stopped after no more than 5 days.  If the symptoms worsen, Afrin can be restarted after 5 days off of therapy to prevent rebound congestion from overuse of Afrin.  I also emphasized that in no way are nasal steroids a concern in terms of addiction.   Return in 2 weeks to see NP with all meds in hand > sinus ct if not better

## 2023-07-31 NOTE — Assessment & Plan Note (Signed)
 Reports progressive LE weakness and unsteady gait. Agreed to outpatient PT once cleared by pulmonology. Advised to start daily chair exercise. May also use resistant bands

## 2023-08-01 ENCOUNTER — Telehealth: Payer: Self-pay

## 2023-08-01 ENCOUNTER — Telehealth: Payer: Self-pay | Admitting: Nurse Practitioner

## 2023-08-01 ENCOUNTER — Ambulatory Visit: Payer: Self-pay | Admitting: Nurse Practitioner

## 2023-08-01 DIAGNOSIS — E781 Pure hyperglyceridemia: Secondary | ICD-10-CM

## 2023-08-01 DIAGNOSIS — E1169 Type 2 diabetes mellitus with other specified complication: Secondary | ICD-10-CM

## 2023-08-01 DIAGNOSIS — R058 Other specified cough: Secondary | ICD-10-CM | POA: Insufficient documentation

## 2023-08-01 MED ORDER — EZETIMIBE 10 MG PO TABS: 10.0000 mg | ORAL_TABLET | Freq: Every day | ORAL | 1 refills | Status: DC

## 2023-08-01 MED ORDER — GLIPIZIDE ER 5 MG PO TB24: 5.0000 mg | ORAL_TABLET | Freq: Every day | ORAL | 1 refills | Status: DC

## 2023-08-01 MED ORDER — ROSUVASTATIN CALCIUM 40 MG PO TABS: 40.0000 mg | ORAL_TABLET | Freq: Every day | ORAL | 1 refills | Status: DC

## 2023-08-01 NOTE — Addendum Note (Signed)
 Addended by: KATHEEN GARDEN L on: 08/01/2023 03:15 PM   Modules accepted: Orders

## 2023-08-01 NOTE — Telephone Encounter (Signed)
 Patient requesting refill Ov with labs 07/31/23   Copied from CRM #[id number]. Topic: Clinical - Medication Refill >> Aug 01, 2023 10:26 AM Chasity T wrote: Medication: rosuvastatin  (CRESTOR ) 10 MG tablet   Has the patient contacted their pharmacy? Yes   This is the patient's preferred pharmacy:  [id number] [address], [place] - [address] AT [facility] OF [facility] & [address] [place] [address] Phone: [phone] Fax: [phone]  Is this the correct pharmacy for this prescription? Yes If no, delete pharmacy and type the correct one.   Has the prescription been filled recently? No  Is the patient out of the medication? Yes  Has the patient been seen for an appointment in the last year OR does the patient have an upcoming appointment? Yes  Can we respond through MyChart? Yes  Agent: Please be advised that Rx refills may take up to 3 business days. We ask that you follow-up with your pharmacy.

## 2023-08-01 NOTE — Telephone Encounter (Signed)
 Copied from CRM [id number]. Topic: Clinical - Medication Refill >> Aug 01, 2023 10:26 AM Chasity T wrote: Medication: rosuvastatin  (CRESTOR ) 10 MG tablet   Has the patient contacted their pharmacy? Yes   This is the patient's preferred pharmacy:  [id number] [place], [place] - [address] AT [facility] OF [facility] & [address] [place] [address] [place] [address] Phone: [phone] Fax: [phone]  Is this the correct pharmacy for this prescription? Yes If no, delete pharmacy and type the correct one.   Has the prescription been filled recently? No  Is the patient out of the medication? Yes  Has the patient been seen for an appointment in the last year OR does the patient have an upcoming appointment? Yes  Can we respond through MyChart? Yes  Agent: Please be advised that Rx refills may take up to 3 business days. We ask that you follow-up with your pharmacy.

## 2023-08-01 NOTE — Assessment & Plan Note (Signed)
 Stopped smoking 2019  - -  pfts 07/22/17 >  Nl x dlco 37 corrects to 50 - 03/01/2021  continue symbicort  80 2bid and added max rx for gerd > resolved  - Allergy profile 03/01/21  >  Eos 0.1 /  IgE  10 - 02/11/2023   continue symbicort  80 2bid for now - 07/31/2023  After extensive coaching inhaler device,  effectiveness =    75% (short Ti) > continue symbicort  80  Was doing fine until cough started back but now using at least bid saba so may need higher doses of symbicort  if can't reduce saba dep p rx for exacerbation of cough > doe

## 2023-08-01 NOTE — Assessment & Plan Note (Signed)
 Abnormal lipid panel: increase crestor  dose to 40mg  and maintain zetia  dose. Schedule f/up appointment with lipid clinic Stable renal function Schedule 3months f/up appointment (fasting)

## 2023-08-01 NOTE — Telephone Encounter (Unsigned)
 Copied from CRM [id number]. Topic: Clinical - Medication Refill >> Aug 01, 2023 10:26 AM Chasity T wrote: Medication: rosuvastatin  (CRESTOR ) 10 MG tablet   Has the patient contacted their pharmacy? Yes   This is the patient's preferred pharmacy:  [id number] [place], [place] - [address] AT [facility] OF [facility] & [address] [place] [address] Phone: [phone] Fax: [phone]  Is this the correct pharmacy for this prescription? Yes If no, delete pharmacy and type the correct one.   Has the prescription been filled recently? No  Is the patient out of the medication? Yes  Has the patient been seen for an appointment in the last year OR does the patient have an upcoming appointment? Yes  Can we respond through MyChart? Yes  Agent: Please be advised that Rx refills may take up to 3 business days. We ask that you follow-up with your pharmacy.

## 2023-08-01 NOTE — Assessment & Plan Note (Signed)
 Recurrent since 11/2022  but did respond to rx on 02/11/23 so rec repeat 07/31/2023   1) max rx for gerd but this time continue for at least 3 m  2) For cough > mucinex  D 1200 mg every 12 hours as needed   and supplement with pearls 300 mg three times a day   3) Augmentin  875 mg take one pill twice daily  X 10 days   4) Prednisone  10 mg take  4 each am x 2 days,   2 each am x 2 days,  1 each am x 2 days and stop   5) closer f/u this time with either ent eval or sinus ct next

## 2023-08-01 NOTE — Assessment & Plan Note (Addendum)
 Quit Smoking 2019 > referred back to [facility] 02/11/2023  and again 07/31/2023   LDSCT ordered but not done > referred back to [facility]   Discussed in detail all the  indications, usual  risks and alternatives  relative to the benefits with patient who agrees to proceed with w/u as outlined.         Each maintenance medication was reviewed in detail including emphasizing most importantly the difference between maintenance and prns and under what circumstances the prns are to be triggered using an action plan format where appropriate.  Total time for H and P, chart review, counseling, reviewing hfa device(s) and generating customized AVS unique to this office visit / same day charting = 33 min

## 2023-08-01 NOTE — Assessment & Plan Note (Signed)
 Uncontrolled DIABETES with hgbA1c at 9.1%: maintain metformin  dose. Add glipizide  5mg  daily. Abnormal lipid panel: increase crestor  dose to 40mg  and maintain zetia  dose. Schedule f/up appointment with lipid clinic Stable renal function Schedule 3months f/up appointment (fasting)

## 2023-08-04 ENCOUNTER — Encounter: Admitting: *Deleted

## 2023-08-04 NOTE — Progress Notes (Signed)
 Attempted to reach x3, no answer. Left message on machine to call office to reschedule SDMV

## 2023-08-05 NOTE — Telephone Encounter (Signed)
 Patient was called on 08/04/23 in another encounter dated on 08/01/23

## 2023-08-05 NOTE — Telephone Encounter (Signed)
-----   Message from [facility] Terrah A sent at 08/04/2023  4:47 PM EDT ----- Last read by Lolita Alana Slade at 1:58PM on 08/02/2023.  Called patient to asked if she had any questions about the comments from [place] about recent labs and to schedule her 3 month f/u appointment. Patient asked how will she go about getting an  appointment with the lipid clinic? I informed her that a referral will have to be placed if she has not been seen at the lipid clinic before. Patient is scheduled for a 3 month f/u. She thanked me  for calling.  ----- Message ----- From: Katheen Roselie Rockford, NP Sent: 08/01/2023   4:06 PM EDT To: Yoceline Bazar Team  Uncontrolled DIABETES with hgbA1c at 9.1%: maintain metformin  dose. Add glipizide  5mg  daily. Abnormal lipid panel: increase crestor  dose to 40mg  and maintain zetia  dose. Schedule f/up appointment with lipid clinic Stable renal function Schedule 3months f/up appointment (fasting)  ----- Message ----- From: Interface, Lab In Three Zero One Sent: 07/31/2023   4:38 PM EDT To: Roselie Rockford Katheen, NP

## 2023-08-12 ENCOUNTER — Other Ambulatory Visit

## 2023-08-14 ENCOUNTER — Encounter: Payer: Self-pay | Admitting: Adult Health

## 2023-08-14 NOTE — Progress Notes (Unsigned)
  Virtual Visit via Telephone Note  I connected with Audrey Russell , 08/14/23 11:26 AM by a telemedicine application and verified that I am speaking with the correct person using two identifiers.  Location: Patient: home Provider: home   I discussed the limitations of evaluation and management by telemedicine and the availability of in person appointments. The patient expressed understanding and agreed to proceed.   Shared Decision Making Visit Lung Cancer Screening Program [phone])   Eligibility: 68 y.o. Pack Years Smoking History Calculation = 33 pack years  (# packs/per year x # years smoked) Recent History of coughing up blood  no Unexplained weight loss? no ( >Than 15 pounds within the last 6 months ) Prior History Lung / other cancer no (Diagnosis within the last 5 years already requiring surveillance chest CT Scans). Smoking Status Former Smoker Former Smokers: Years since quit: 6 years  Quit Date: 2019  Visit Components: Discussion included one or more decision making aids. YES Discussion included risk/benefits of screening. YES Discussion included potential follow up diagnostic testing for abnormal scans. YES Discussion included meaning and risk of over diagnosis. YES Discussion included meaning and risk of False Positives. YES Discussion included meaning of total radiation exposure. YES  Counseling Included: Importance of adherence to annual lung cancer LDCT screening. YES Impact of comorbidities on ability to participate in the program. YES Ability and willingness to under diagnostic treatment. YES  Smoking Cessation Counseling: Former Smokers:  Discussed the importance of maintaining cigarette abstinence. yes Diagnosis Code: Personal History of Nicotine Dependence. S12.108 Information about tobacco cessation classes and interventions provided to patient. Yes Patient provided with ticket for LDCT Scan. yes Written Order for Lung Cancer Screening  with LDCT placed in Epic. Yes (CT Chest Lung Cancer Screening Low Dose W/O CM) [id number]  Z12.2-Screening of respiratory organs Z87.891-Personal history of nicotine dependence   [name] 08/14/23

## 2023-08-15 ENCOUNTER — Ambulatory Visit (INDEPENDENT_AMBULATORY_CARE_PROVIDER_SITE_OTHER): Admitting: Adult Health

## 2023-08-15 DIAGNOSIS — Z87891 Personal history of nicotine dependence: Secondary | ICD-10-CM | POA: Diagnosis not present

## 2023-08-15 NOTE — Patient Instructions (Signed)

## 2023-08-19 ENCOUNTER — Other Ambulatory Visit: Payer: Self-pay | Admitting: Nurse Practitioner

## 2023-08-19 ENCOUNTER — Ambulatory Visit
Admission: RE | Admit: 2023-08-19 | Discharge: 2023-08-19 | Disposition: A | Discharge status: Home / Self Care | Source: Ambulatory Visit | Attending: Acute Care | Admitting: Acute Care

## 2023-08-19 DIAGNOSIS — Z122 Encounter for screening for malignant neoplasm of respiratory organs: Secondary | ICD-10-CM | POA: Diagnosis not present

## 2023-08-19 DIAGNOSIS — Z87891 Personal history of nicotine dependence: Secondary | ICD-10-CM

## 2023-08-19 DIAGNOSIS — E781 Pure hyperglyceridemia: Secondary | ICD-10-CM

## 2023-08-25 ENCOUNTER — Other Ambulatory Visit: Payer: Self-pay

## 2023-08-25 DIAGNOSIS — Z87891 Personal history of nicotine dependence: Secondary | ICD-10-CM

## 2023-08-25 DIAGNOSIS — Z122 Encounter for screening for malignant neoplasm of respiratory organs: Secondary | ICD-10-CM

## 2023-08-26 ENCOUNTER — Ambulatory Visit

## 2023-08-26 VITALS — BP 132/68 | HR 74 | Temp 98.2°F | Ht 67.0 in | Wt 146.6 lb

## 2023-08-26 DIAGNOSIS — J4489 Other specified chronic obstructive pulmonary disease: Secondary | ICD-10-CM | POA: Diagnosis not present

## 2023-08-26 DIAGNOSIS — C3491 Malignant neoplasm of unspecified part of right bronchus or lung: Secondary | ICD-10-CM

## 2023-08-26 DIAGNOSIS — Z85118 Personal history of other malignant neoplasm of bronchus and lung: Secondary | ICD-10-CM | POA: Diagnosis not present

## 2023-08-26 DIAGNOSIS — J310 Chronic rhinitis: Secondary | ICD-10-CM | POA: Diagnosis not present

## 2023-08-26 DIAGNOSIS — R490 Dysphonia: Secondary | ICD-10-CM

## 2023-08-26 DIAGNOSIS — I2510 Atherosclerotic heart disease of native coronary artery without angina pectoris: Secondary | ICD-10-CM | POA: Diagnosis not present

## 2023-08-26 MED ORDER — LEVOFLOXACIN 500 MG PO TABS: 500.0000 mg | ORAL_TABLET | Freq: Every day | ORAL | 0 refills | Status: DC

## 2023-08-26 MED ORDER — FLUCONAZOLE 150 MG PO TABS: 150.0000 mg | ORAL_TABLET | Freq: Every day | ORAL | 1 refills | Status: DC

## 2023-08-26 NOTE — Assessment & Plan Note (Signed)
-    s/p resection; no new findings on Chest CT -  Continue yearly screening

## 2023-08-26 NOTE — Patient Instructions (Signed)
 Start Levaquin  daily; finish full course.  Continue Symbicort , Albuterol , Flonase , Mucinex , PPI as prescribed.  Follow up in 6- 8 weeks.  Call if new or worsening symptoms.

## 2023-08-26 NOTE — Progress Notes (Signed)
 @Patient  ID: Audrey Russell, female    DOB: 1955/06/22, 68 y.o.   MRN: [id number]  Chief Complaint  Patient presents with   Follow-up    CT f/u     Referring provider: [name], Roselie Rockford, NP  HPI: [name] is a 68 y/o female former smoker with PMH of RUL lung adenocarcinoma s/p lobectormy, asthmatic bronchitis, DM, MS, and HTN who presents today for 2-week follow up after treatment for an exacerbation.  She reports that she finished the antibiotic and steroid, but continues with a loose cough.  She is concerned that she still has an infection.  She also reports hoarseness that she feels has been getting worse for the last 4-6 months.  She also c/o night sweats and fatigue and is concerned because this is what happened when she was diagnosed with lung cancer.  She denies fevers, chest pain, HA, dizziness, weight changes.  Her Chest CT for lung cancer screening was completed on 08/19/2023; results reviewed with her today.  See full results below.  TEST/EVENTS : Chest CT 08/19/2023:  IMPRESSION: 1. Lung-RADS 1, negative. Continue annual screening with low-dose chest CT without contrast in 12 months. 2. One vessel coronary atherosclerosis. 3. Mildly enlarged posterior paraesophageal lymph node is stable from 02/22/2022 CT, nonspecific, more likely reactive. 4. Aortic Atherosclerosis (ICD10-I70.0) and Emphysema (ICD10-J43.9).  No Known Allergies  Immunization History  Administered Date(s) Administered   Fluad Quad(high Dose 65+) 10/02/2018, 10/27/2021   Influenza, High Dose Seasonal PF 01/28/2017, 02/10/2018, 09/24/2022   Influenza,inj,Quad PF,6+ Mos 01/17/2020, 10/24/2020   Influenza-Unspecified 09/24/2022   Moderna Covid-19 Fall Seasonal Vaccine 27yrs & older 09/24/2022   PFIZER(Purple Top)SARS-COV-2 Vaccination 04/10/2019, 05/01/2019, 01/17/2020   Pfizer(Comirnaty)Fall Seasonal Vaccine 12 years and older 10/27/2021   Pneumococcal Conjugate-13 07/31/2017    Pneumococcal Polysaccharide-23 09/02/2018, 10/27/2021   Tdap 01/28/2017, 09/24/2022   Unspecified SARS-COV-2 Vaccination 09/24/2022   Zoster Recombinant(Shingrix ) 11/24/2020, 05/30/2022, 06/03/2022   Zoster, Live 11/24/2020, 05/30/2022    Past Medical History:  Diagnosis Date   Abdominal pain 05/25/2016   Anxiety    Arthritis    neck, knees, ankles (08/15/2017)   Bipolar disorder (HCC)    Cancer of upper lobe of right lung (HCC) 08/15/2017   Chickenpox    Chronic neck pain    Chronic sinus infection    COPD (chronic obstructive pulmonary disease) (HCC)    emphysema   Depression    Fibromyalgia    GERD (gastroesophageal reflux disease)    Heart murmur    as a baby   Hyperlipidemia    Hypertension    Migraine 1975   Multiple sclerosis (HCC) 1975   Seasonal allergies    Type II diabetes mellitus (HCC)     Tobacco History: Social History   Tobacco Use  Smoking Status Former   Current packs/day: 0.00   Average packs/day: 0.5 packs/day for 44.0 years (22.0 ttl pk-yrs)   Types: Cigarettes   Start date: 08/11/1973   Quit date: 08/11/2017   Years since quitting: 6.0   Passive exposure: Current  Smokeless Tobacco Never   Counseling given: Not Answered   Outpatient Medications Prior to Visit  Medication Sig Dispense Refill   amoxicillin -clavulanate (AUGMENTIN ) 875-125 MG tablet Take 1 tablet by mouth 2 (two) times daily. 20 tablet 0   aspirin  EC 81 MG tablet Take 1 tablet (81 mg total) by mouth daily. 90 tablet 3   blood glucose meter kit and supplies One Touch Contour Monitor. Check glucose in AM, before  breakfast. E11.65 1 each 0   budesonide -formoterol  (SYMBICORT ) 80-4.5 MCG/ACT inhaler INHALE 2 PUFFS INTO THE LUNGS DAILY. RINSE MOUTH AFTER EACH USE 10.2 each 5   citalopram  (CELEXA ) 40 MG tablet TAKE 1 TABLET BY MOUTH EVERY DAY 90 tablet 3   ezetimibe  (ZETIA ) 10 MG tablet Take 1 tablet (10 mg total) by mouth daily. 90 tablet 1   famotidine  (PEPCID ) 20 MG tablet One  after supper 30 tablet 11   fenofibrate  160 MG tablet TAKE 1 TABLET BY MOUTH EVERY DAY 90 tablet 1   fluticasone  (FLONASE ) 50 MCG/ACT nasal spray SHAKE LIQUID AND USE 2 SPRAYS IN EACH NOSTRIL EVERY DAY 48 g 0   glipiZIDE  (GLUCOTROL  XL) 5 MG 24 hr tablet Take 1 tablet (5 mg total) by mouth daily with breakfast. 90 tablet 1   metFORMIN  (GLUCOPHAGE ) 850 MG tablet TAKE 1 TABLET(850 MG) BY MOUTH TWICE DAILY WITH A MEAL 180 tablet 1   mirtazapine  (REMERON ) 30 MG tablet TAKE 1 TABLET BY MOUTH EVERYDAY AT BEDTIME 90 tablet 3   ONETOUCH ULTRA TEST test strip USE AS INSTRUCTED 50 strip 0   pantoprazole  (PROTONIX ) 40 MG tablet Take 1 tablet (40 mg total) by mouth daily. Take 30-60 min before first meal of the day 30 tablet 2   rosuvastatin  (CRESTOR ) 40 MG tablet Take 1 tablet (40 mg total) by mouth daily. 90 tablet 1   VENTOLIN  HFA 108 (90 Base) MCG/ACT inhaler INHALE 1 TO 2 PUFFS INTO THE LUNGS EVERY 6 HOURS AS NEEDED FOR WHEEZING OR SHORTNESS OF BREATH 18 g 1   predniSONE  (DELTASONE ) 10 MG tablet Take  4 each am x 2 days,   2 each am x 2 days,  1 each am x 2 days and stop (Patient not taking: Reported on 08/26/2023) 14 tablet 0   No facility-administered medications prior to visit.     Review of Systems:   Constitutional:   No  weight loss,  Fevers, chills, or  lassitude.  ++night sweats  ++fatigue  HEENT:   No headaches,  Difficulty swallowing,  Tooth/dental problems, or  Sore throat,                No sneezing, itching, ear ache, nasal congestion, post nasal drip, ++ for hoarseness  CV:  No chest pain,  Orthopnea, PND, swelling in lower extremities, anasarca, dizziness, palpitations, syncope.   GI  No heartburn, indigestion, abdominal pain, nausea, vomiting, diarrhea, change in bowel habits, loss of appetite, bloody stools.   Resp: No shortness of breath with exertion or at rest.  No excess mucus, no productive cough,  No non-productive cough,  No coughing up of blood.  No change in color of mucus.   No wheezing.  No chest wall deformity  Skin: no rash or lesions.  GU: no dysuria, change in color of urine, no urgency or frequency.  No flank pain, no hematuria   MS:  No joint pain or swelling.  No decreased range of motion.  No back pain.    Physical Exam  BP 132/68 (BP Location: Left Arm, Patient Position: Sitting, Cuff Size: Normal)   Pulse 74   Temp 98.2 F (36.8 C) (Oral)   Ht 5' 7 (1.702 m)   Wt 146 lb 9.6 oz (66.5 kg)   SpO2 98%   BMI 22.96 kg/m   GEN: A/Ox3; pleasant , NAD, well nourished    HEENT:  [place]/AT,  EACs-clear, TMs-wnl, NOSE-clear, THROAT-injected posterior pharynx; no enlargement of the uvula.  no  lesions, no postnasal drip or exudate noted. Mallampati I  NECK:  Supple w/ fair ROM; no JVD; normal carotid impulses w/o bruits; no thyromegaly or nodules palpated; no lymphadenopathy.    RESP  Clear  P & A; w/o, wheezes/ rales/ or rhonchi. no accessory muscle use, no dullness to percussion.  Coarse upper airway noise  CARD:  RRR, no m/r/g, no peripheral edema, pulses intact, no cyanosis or clubbing.  GI:   Soft & nt; nml bowel sounds; no organomegaly or masses detected.   Musco: Warm bil, no deformities or joint swelling noted.   Neuro: alert, no focal deficits noted.    Skin: Warm, no lesions or rashes    Lab Results:  CBC    Component Value Date/Time   WBC 7.6 12/02/2022 1040   RBC 5.15 (H) 12/02/2022 1040   HGB 14.9 12/02/2022 1040   HGB 14.8 02/22/2022 1331   HCT 44.6 12/02/2022 1040   PLT 248.0 12/02/2022 1040   PLT 247 02/22/2022 1331   MCV 86.6 12/02/2022 1040   MCH 28.5 02/22/2022 1331   MCHC 33.3 12/02/2022 1040   RDW 14.4 12/02/2022 1040   LYMPHSABS 2.4 12/02/2022 1040   MONOABS 0.6 12/02/2022 1040   EOSABS 0.1 12/02/2022 1040   BASOSABS 0.1 12/02/2022 1040    BMET    Component Value Date/Time   NA 136 07/31/2023 1415   NA 141 09/24/2018 1214   K 4.1 07/31/2023 1415   CL 100 07/31/2023 1415   CO2 27 07/31/2023 1415    GLUCOSE 129 (H) 07/31/2023 1415   BUN 17 07/31/2023 1415   BUN 9 09/24/2018 1214   CREATININE 1.02 07/31/2023 1415   CREATININE 0.86 02/22/2022 1331   CREATININE 0.90 02/18/2020 1450   CALCIUM  10.1 07/31/2023 1415   GFRNONAA >60 02/22/2022 1331   GFRAA >60 05/07/2019 1122   GFRAA >60 11/03/2018 1033    BNP    Component Value Date/Time   BNP <4 02/18/2020 1450    ProBNP No results found for: PROBNP  Imaging: CT CHEST LUNG CA SCREEN LOW DOSE W/O CM Result Date: 08/25/2023 CLINICAL DATA:  68 year old female former smoker with 33 pack-year smoking history, quit smoking 6 years prior. EXAM: CT CHEST WITHOUT CONTRAST LOW-DOSE FOR LUNG CANCER SCREENING TECHNIQUE: Multidetector CT imaging of the chest was performed following the standard protocol without IV contrast. RADIATION DOSE REDUCTION: This exam was performed according to the departmental dose-optimization program which includes automated exposure control, adjustment of the mA and/or kV according to patient size and/or use of iterative reconstruction technique. COMPARISON:  02/22/2022 chest CT. FINDINGS: Cardiovascular: Normal heart size. No significant pericardial effusion/thickening. Right coronary atherosclerosis. Atherosclerotic nonaneurysmal thoracic aorta. Normal caliber pulmonary arteries. Mediastinum/Nodes: No significant thyroid  nodules. Unremarkable esophagus. No axillary adenopathy. Mildly enlarged 1.1 cm posterior paraesophageal node on series 2/image 28, stable from 02/22/2022 CT. No additional pathologically enlarged mediastinal nodes. No discrete hilar adenopathy on this noncontrast images. Lungs/Pleura: Status post right upper lobectomy. No pneumothorax. No pleural effusion. Severe centrilobular emphysema with diffuse bronchial wall thickening. No acute consolidative airspace disease or lung masses. No significant pulmonary nodules. Upper abdomen: Cholecystectomy. Musculoskeletal: No aggressive appearing focal osseous lesions.  Moderate thoracic spondylosis. IMPRESSION: 1. Lung-RADS 1, negative. Continue annual screening with low-dose chest CT without contrast in 12 months. 2. One vessel coronary atherosclerosis. 3. Mildly enlarged posterior paraesophageal lymph node is stable from 02/22/2022 CT, nonspecific, more likely reactive. 4. Aortic Atherosclerosis (ICD10-I70.0) and Emphysema (ICD10-J43.9). Electronically Signed   By: Selinda DELENA Blue  M.D.   On: 08/25/2023 15:46    Administration History     None          Latest Ref Rng & Units 07/22/2017    3:40 PM  PFT Results  FVC-Pre L 2.84   FVC-Predicted Pre % 88   FVC-Post L 2.96   FVC-Predicted Post % 92   Pre FEV1/FVC % % 71   Post FEV1/FCV % % 71   FEV1-Pre L 2.00   FEV1-Predicted Pre % 79   FEV1-Post L 2.10   DLCO uncorrected ml/min/mmHg 11.72   DLCO UNC% % 37   DLVA Predicted % 50     No results found for: NITRICOXIDE   Assessment & Plan:   Assessment & Plan Asthmatic bronchitis , chronic (HCC) -  Recent exacerbation; mostly improved but still with wet cough, Chest CT does not demonstrate a focal pneumonia -  Continued cough:  Complete a second round of antibiotics to cover atypicals and follow up as needed -  Continue Symbicort , Albuterol , Mucinex , Flonase , PPI Adenocarcinoma of right lung, stage 1 (HCC) -  s/p resection; no new findings on Chest CT -  Continue yearly screening Chronic rhinitis -  continue Flonase  Hoarseness of voice -  Likely multifactorial in the setting of recent illness, chronic rhinitis, and GERD; however, patient indicates it has been going on for 4-6 months or more -  Follows with ENT for chronic rhinitis and serous otitis -  Requests a new ENT referral for evaluation of hoarseness; Referral placed Atherosclerosis of coronary artery without angina pectoris, unspecified vessel or lesion type, unspecified whether native or transplanted heart -  Patient reports that she follows with Dr. Okey from Cardiology; advised of  findings on CT -  She indicated that she would schedule follow up with Cardiology   Return in about 7 weeks (around 10/14/2023).  Candis Dandy, PA-C 08/26/2023

## 2023-08-26 NOTE — Assessment & Plan Note (Signed)
-  continue Flonase ?

## 2023-08-26 NOTE — Assessment & Plan Note (Signed)
-    Recent exacerbation; mostly improved but still with wet cough, Chest CT does not demonstrate a focal pneumonia -  Continued cough:  Complete a second round of antibiotics to cover atypicals and follow up as needed -  Continue Symbicort , Albuterol , Mucinex , Flonase , PPI

## 2023-09-09 ENCOUNTER — Other Ambulatory Visit: Payer: Self-pay | Admitting: Nurse Practitioner

## 2023-09-09 DIAGNOSIS — J449 Chronic obstructive pulmonary disease, unspecified: Secondary | ICD-10-CM

## 2023-09-16 NOTE — Telephone Encounter (Signed)
 Confirmed appt & location [id number]

## 2023-09-24 DIAGNOSIS — E119 Type 2 diabetes mellitus without complications: Secondary | ICD-10-CM | POA: Diagnosis not present

## 2023-09-24 DIAGNOSIS — H02421 Myogenic ptosis of right eyelid: Secondary | ICD-10-CM | POA: Diagnosis not present

## 2023-09-24 DIAGNOSIS — H31091 Other chorioretinal scars, right eye: Secondary | ICD-10-CM | POA: Diagnosis not present

## 2023-09-24 DIAGNOSIS — H2513 Age-related nuclear cataract, bilateral: Secondary | ICD-10-CM | POA: Diagnosis not present

## 2023-09-24 LAB — HM DIABETES EYE EXAM

## 2023-09-30 ENCOUNTER — Encounter (INDEPENDENT_AMBULATORY_CARE_PROVIDER_SITE_OTHER): Payer: Self-pay

## 2023-09-30 ENCOUNTER — Other Ambulatory Visit: Payer: Self-pay | Admitting: Nurse Practitioner

## 2023-09-30 DIAGNOSIS — H6993 Unspecified Eustachian tube disorder, bilateral: Secondary | ICD-10-CM

## 2023-10-02 ENCOUNTER — Encounter: Payer: Self-pay | Admitting: Nurse Practitioner

## 2023-10-25 ENCOUNTER — Other Ambulatory Visit: Payer: Self-pay | Admitting: Internal Medicine

## 2023-10-25 DIAGNOSIS — J4489 Other specified chronic obstructive pulmonary disease: Secondary | ICD-10-CM

## 2023-10-30 ENCOUNTER — Other Ambulatory Visit: Payer: Self-pay | Admitting: Nurse Practitioner

## 2023-10-30 DIAGNOSIS — E1169 Type 2 diabetes mellitus with other specified complication: Secondary | ICD-10-CM

## 2023-10-30 DIAGNOSIS — E781 Pure hyperglyceridemia: Secondary | ICD-10-CM

## 2023-10-30 NOTE — Progress Notes (Signed)
 This encounter was created in error - please disregard.

## 2023-11-11 ENCOUNTER — Ambulatory Visit: Admitting: Nurse Practitioner

## 2023-11-13 ENCOUNTER — Encounter (INDEPENDENT_AMBULATORY_CARE_PROVIDER_SITE_OTHER): Payer: Self-pay
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
## 2023-11-17 ENCOUNTER — Encounter: Payer: Self-pay | Admitting: Nurse Practitioner

## 2023-11-17 ENCOUNTER — Ambulatory Visit: Admitting: Nurse Practitioner

## 2023-11-17 VITALS — BP 132/70 | HR 82 | Temp 98.1°F | Ht 67.0 in | Wt 150.6 lb

## 2023-11-17 DIAGNOSIS — Z7984 Long term (current) use of oral hypoglycemic drugs: Secondary | ICD-10-CM | POA: Diagnosis not present

## 2023-11-17 DIAGNOSIS — E785 Hyperlipidemia, unspecified: Secondary | ICD-10-CM

## 2023-11-17 DIAGNOSIS — B070 Plantar wart: Secondary | ICD-10-CM

## 2023-11-17 DIAGNOSIS — J449 Chronic obstructive pulmonary disease, unspecified: Secondary | ICD-10-CM

## 2023-11-17 DIAGNOSIS — E1169 Type 2 diabetes mellitus with other specified complication: Secondary | ICD-10-CM | POA: Diagnosis not present

## 2023-11-17 DIAGNOSIS — I10 Essential (primary) hypertension: Secondary | ICD-10-CM

## 2023-11-17 DIAGNOSIS — M85832 Other specified disorders of bone density and structure, left forearm: Secondary | ICD-10-CM

## 2023-11-17 DIAGNOSIS — M85831 Other specified disorders of bone density and structure, right forearm: Secondary | ICD-10-CM

## 2023-11-17 LAB — COMPREHENSIVE METABOLIC PANEL WITH GFR
ALT: 13 U/L (ref 0–35)
AST: 18 U/L (ref 0–37)
Albumin: 4.2 g/dL (ref 3.5–5.2)
Alkaline Phosphatase: 57 U/L (ref 39–117)
BUN: 10 mg/dL (ref 6–23)
CO2: 28 meq/L (ref 19–32)
Calcium: 9.3 mg/dL (ref 8.4–10.5)
Chloride: 106 meq/L (ref 96–112)
Creatinine, Ser: 0.96 mg/dL (ref 0.40–1.20)
GFR: 61.07 mL/min (ref >60.00)
Glucose, Bld: 190 mg/dL — ABNORMAL HIGH (ref 70–99)
Potassium: 3.9 meq/L (ref 3.5–5.1)
Sodium: 141 meq/L (ref 135–145)
Total Bilirubin: 0.3 mg/dL (ref 0.2–1.2)
Total Protein: 7.5 g/dL (ref 6.0–8.3)

## 2023-11-17 LAB — HEMOGLOBIN A1C: Hgb A1c MFr Bld: 8.5 % — ABNORMAL HIGH (ref 4.6–6.5)

## 2023-11-17 MED ORDER — LANCETS MISC: 1.0000 | Freq: Every morning | 3 refills | Status: AC

## 2023-11-17 MED ORDER — BLOOD GLUCOSE TEST VI STRP: 1.0000 | ORAL_STRIP | Freq: Every morning | 3 refills | Status: AC

## 2023-11-17 MED ORDER — LANCET DEVICE MISC: 1.0000 | Freq: Every morning | 0 refills | Status: AC

## 2023-11-17 MED ORDER — BUDESONIDE-FORMOTEROL FUMARATE 80-4.5 MCG/ACT IN AERO: INHALATION_SPRAY | RESPIRATORY_TRACT | 5 refills | Status: DC

## 2023-11-17 MED ORDER — BLOOD GLUCOSE MONITORING SUPPL DEVI: 1.0000 | Freq: Every morning | 0 refills | Status: AC

## 2023-11-17 MED ORDER — METFORMIN HCL 850 MG PO TABS: 850.0000 mg | ORAL_TABLET | Freq: Two times a day (BID) | ORAL | 1 refills | Status: AC

## 2023-11-17 MED ORDER — FENOFIBRATE 160 MG PO TABS: 160.0000 mg | ORAL_TABLET | Freq: Every day | ORAL | 1 refills | Status: DC

## 2023-11-17 NOTE — Assessment & Plan Note (Signed)
 BP remains at goal BP Readings from Last 3 Encounters:  11/17/23 132/70  08/26/23 132/68  07/31/23 122/64

## 2023-11-17 NOTE — Assessment & Plan Note (Signed)
 Uncontrolled DIABETES with hgbA1c at 9.1% No adverse effects with metformin  and glipizide  5mg  LDL not at goal with crestor  dose to 40mg  and zetia . Positive neuropathy Negative retinopathy  Repeat hgbA1c, UACr, lipid panel, CMP Provide new glucometer Prescription. F/up in 3months

## 2023-11-17 NOTE — Assessment & Plan Note (Signed)
 Provide number to schedule appointment with advanced lipid clinic

## 2023-11-17 NOTE — Progress Notes (Signed)
 Established Patient Visit  Patient: Audrey Russell   DOB: [birth year]   68 y.o. Female  MRN: [id number] Visit Date: 11/17/2023  Subjective:    Chief Complaint  Patient presents with   Follow-up    3 month follow up    Rash This is a chronic problem. The current episode started more than 1 month ago. The problem has been gradually worsening since onset. The affected locations include the right foot. The rash is characterized by pain. She was exposed to nothing. Pertinent negatives include no joint pain. Past treatments include nothing. There is no history of allergies, eczema or varicella.   Essential hypertension BP remains at goal BP Readings from Last 3 Encounters:  11/17/23 132/70  08/26/23 132/68  07/31/23 122/64    Hyperlipidemia associated with type 2 diabetes mellitus (HCC) Provide number to schedule appointment with advanced lipid clinic  DM (diabetes mellitus) (HCC) Uncontrolled DIABETES with hgbA1c at 9.1% No adverse effects with metformin  and glipizide  5mg  LDL not at goal with crestor  dose to 40mg  and zetia . Positive neuropathy Negative retinopathy  Repeat hgbA1c, UACr, lipid panel, CMP Provide new glucometer Prescription. F/up in 3months  Wt Readings from Last 3 Encounters:  11/17/23 150 lb 9.6 oz (68.3 kg)  08/26/23 146 lb 9.6 oz (66.5 kg)  07/31/23 147 lb 3.2 oz (66.8 kg)    Reviewed medical, surgical, and social history today  Medications: Outpatient Medications Prior to Visit  Medication Sig   aspirin  EC 81 MG tablet Take 1 tablet (81 mg total) by mouth daily.   citalopram  (CELEXA ) 40 MG tablet TAKE 1 TABLET BY MOUTH EVERY DAY   ezetimibe  (ZETIA ) 10 MG tablet TAKE 1 TABLET(10 MG) BY MOUTH DAILY   famotidine  (PEPCID ) 20 MG tablet One after supper   fluconazole  (DIFLUCAN ) 150 MG tablet Take 1 tablet (150 mg total) by mouth daily.   fluticasone  (FLONASE ) 50 MCG/ACT nasal spray SHAKE LIQUID AND USE 2 SPRAYS IN EACH NOSTRIL  EVERY DAY   glipiZIDE  (GLUCOTROL  XL) 5 MG 24 hr tablet TAKE 1 TABLET(5 MG) BY MOUTH DAILY WITH BREAKFAST   mirtazapine  (REMERON ) 30 MG tablet TAKE 1 TABLET BY MOUTH EVERYDAY AT BEDTIME   pantoprazole  (PROTONIX ) 40 MG tablet TAKE 1 TABLET(40 MG) BY MOUTH DAILY 30 TO 60 MINUTES BEFORE FIRST MEAL OF THE DAY   predniSONE  (DELTASONE ) 10 MG tablet Take  4 each am x 2 days,   2 each am x 2 days,  1 each am x 2 days and stop   rosuvastatin  (CRESTOR ) 40 MG tablet TAKE 1 TABLET(40 MG) BY MOUTH DAILY   VENTOLIN  HFA 108 (90 Base) MCG/ACT inhaler INHALE 1 TO 2 PUFFS INTO THE LUNGS EVERY 6 HOURS AS NEEDED FOR WHEEZING OR SHORTNESS OF BREATH   [DISCONTINUED] amoxicillin -clavulanate (AUGMENTIN ) 875-125 MG tablet Take 1 tablet by mouth 2 (two) times daily.   [DISCONTINUED] blood glucose meter kit and supplies One Touch Contour Monitor. Check glucose in AM, before breakfast. E11.65   [DISCONTINUED] budesonide -formoterol  (SYMBICORT ) 80-4.5 MCG/ACT inhaler INHALE 2 PUFFS INTO THE LUNGS DAILY. RINSE MOUTH AFTER EACH USE   [DISCONTINUED] COMIRNATY syringe    [DISCONTINUED] fenofibrate  160 MG tablet TAKE 1 TABLET BY MOUTH EVERY DAY   [DISCONTINUED] FLUZONE HIGH-DOSE 0.5 ML injection    [DISCONTINUED] levofloxacin  (LEVAQUIN ) 500 MG tablet Take 1 tablet (500 mg total) by mouth daily.   [DISCONTINUED] metFORMIN  (GLUCOPHAGE ) 850 MG tablet TAKE 1 TABLET(850 MG)  BY MOUTH TWICE DAILY WITH A MEAL   [DISCONTINUED] ONETOUCH ULTRA TEST test strip USE AS INSTRUCTED   No facility-administered medications prior to visit.   Reviewed past medical and social history.   ROS per HPI above  Last metabolic panel Lab Results  Component Value Date   GLUCOSE 129 (H) 07/31/2023   NA 136 07/31/2023   K 4.1 07/31/2023   CL 100 07/31/2023   CO2 27 07/31/2023   BUN 17 07/31/2023   CREATININE 1.02 07/31/2023   GFR 56.90 (L) 07/31/2023   CALCIUM  10.1 07/31/2023   PHOS 3.6 12/02/2022   PROT 8.4 (H) 07/31/2023   ALBUMIN 4.8 07/31/2023    BILITOT 0.5 07/31/2023   ALKPHOS 73 07/31/2023   AST 20 07/31/2023   ALT 16 07/31/2023   ANIONGAP 7 02/22/2022   Last lipids Lab Results  Component Value Date   CHOL 257 (H) 07/31/2023   HDL 38.30 (L) 07/31/2023   LDLCALC 172 (H) 07/31/2023   LDLDIRECT 73.0 08/17/2020   TRIG 236.0 (H) 07/31/2023   CHOLHDL 7 07/31/2023   Last hemoglobin A1c Lab Results  Component Value Date   HGBA1C 9.1 (H) 07/31/2023        Objective:  BP 132/70 (BP Location: Left Arm, Patient Position: Sitting, Cuff Size: Large)   Pulse 82   Temp 98.1 F (36.7 C) (Temporal)   Ht 5' 7 (1.702 m)   Wt 150 lb 9.6 oz (68.3 kg)   BMI 23.59 kg/m      Physical Exam Vitals and nursing note reviewed.  Cardiovascular:     Rate and Rhythm: Normal rate and regular rhythm.     Pulses: Normal pulses.          Dorsalis pedis pulses are 2+ on the right side and 2+ on the left side.       Posterior tibial pulses are 2+ on the right side and 2+ on the left side.     Heart sounds: Normal heart sounds.  Pulmonary:     Effort: Pulmonary effort is normal.     Breath sounds: Normal breath sounds.  Musculoskeletal:     Right lower leg: No edema.     Left lower leg: No edema.       Feet:  Feet:     Right foot:     Toenail Condition: Right toenails are abnormally thick and long.     Left foot:     Toenail Condition: Left toenails are abnormally thick and long.     Comments: Cryotherapy Obtained verbal consent from Mrs. Billy. Procedure was explained. Advised about possible need to repeat procedure in 1-2wks. Advised about possible redness at site.She/He verbalized understanding. Plantar's wart on right foot, number of lesions:4 Method of Treatment: use Histofreezer to apply liquid nitrogen x 20seconds on each lesion. Well tolerated. Applied bandaid to site No complication reported.   Neurological:     Mental Status: She is alert and oriented to person, place, and time.     No results found for any  visits on 11/17/23.    Assessment & Plan:    Problem List Items Addressed This Visit     DM (diabetes mellitus) (HCC) - Primary   Uncontrolled DIABETES with hgbA1c at 9.1% No adverse effects with metformin  and glipizide  5mg  LDL not at goal with crestor  dose to 40mg  and zetia . Positive neuropathy Negative retinopathy  Repeat hgbA1c, UACr, lipid panel, CMP Provide new glucometer Prescription. F/up in 3months      Relevant Medications  Blood Glucose Monitoring Suppl DEVI   Glucose Blood (BLOOD GLUCOSE TEST STRIPS) STRP   Lancet Device MISC   Lancets MISC   metFORMIN  (GLUCOPHAGE ) 850 MG tablet   Other Relevant Orders   Microalbumin / creatinine urine ratio   Hemoglobin A1c   Comprehensive metabolic panel with GFR   Essential hypertension   BP remains at goal BP Readings from Last 3 Encounters:  11/17/23 132/70  08/26/23 132/68  07/31/23 122/64        Relevant Medications   fenofibrate  160 MG tablet   Other Relevant Orders   Comprehensive metabolic panel with GFR   Hyperlipidemia associated with type 2 diabetes mellitus (HCC)   Provide number to schedule appointment with advanced lipid clinic      Relevant Medications   fenofibrate  160 MG tablet   metFORMIN  (GLUCOPHAGE ) 850 MG tablet   Other Relevant Orders   Comprehensive metabolic panel with GFR   Long term (current) use of oral hypoglycemic drugs   Relevant Medications   Blood Glucose Monitoring Suppl DEVI   Glucose Blood (BLOOD GLUCOSE TEST STRIPS) STRP   Lancet Device MISC   Lancets MISC   metFORMIN  (GLUCOPHAGE ) 850 MG tablet   Other Relevant Orders   Microalbumin / creatinine urine ratio   Hemoglobin A1c   Osteopenia of both forearms   Relevant Orders   DG Bone Density   Other Visit Diagnoses       Plantar wart of right foot         Chronic obstructive pulmonary disease, unspecified COPD type (HCC)       Relevant Medications   budesonide -formoterol  (SYMBICORT ) 80-4.5 MCG/ACT inhaler       Return in about 3 months (around 02/17/2024).     Roselie Mood, NP

## 2023-11-17 NOTE — Patient Instructions (Addendum)
 Schedule appointment with lipid clinic: [phone] Schedule appointment with breast center: [phone] Go to lab

## 2023-11-18 ENCOUNTER — Ambulatory Visit: Payer: Self-pay | Admitting: Nurse Practitioner

## 2023-11-18 DIAGNOSIS — E1169 Type 2 diabetes mellitus with other specified complication: Secondary | ICD-10-CM

## 2023-11-18 MED ORDER — GLIPIZIDE 5 MG PO TABS: 5.0000 mg | ORAL_TABLET | Freq: Two times a day (BID) | ORAL | 1 refills | Status: AC

## 2023-11-18 NOTE — Progress Notes (Signed)
 Audrey Russell                                          MRN: [id number]   11/18/2023   The [facility]BC[facility] Quality Team Specialist reviewed this patient medical record for the purposes of chart review for care gap closure. The following were reviewed: chart review for care gap closure-kidney health evaluation for diabetes:eGFR  and uACR.    VBCI Quality Team

## 2023-11-18 NOTE — Progress Notes (Signed)
 Audrey Russell                                          MRN: [id number]   11/18/2023   The VBCI Quality Team Specialist reviewed this patient medical record for the purposes of chart review for care gap closure. The following were reviewed: abstraction for care gap closure-glycemic status assessment.    VBCI Quality Team

## 2023-11-19 ENCOUNTER — Other Ambulatory Visit: Payer: Self-pay | Admitting: Nurse Practitioner

## 2023-11-21 ENCOUNTER — Ambulatory Visit: Attending: Internal Medicine | Admitting: Internal Medicine

## 2023-11-21 ENCOUNTER — Encounter: Payer: Self-pay | Admitting: Internal Medicine

## 2023-11-21 VITALS — BP 130/68 | HR 90 | Ht 67.0 in | Wt 145.6 lb

## 2023-11-21 DIAGNOSIS — I10 Essential (primary) hypertension: Secondary | ICD-10-CM

## 2023-11-21 NOTE — Patient Instructions (Signed)
 Medication Instructions:  Your physician recommends that you continue on your current medications as directed. Please refer to the Current Medication list given to you today.  *If you need a refill on your cardiac medications before your next appointment, please call your pharmacy*  Lab Work: NMR and Lpa -- today  You may go to any [facility] Location for your lab work:  [facility] - [address] ([facility]) - [address] [phone] [address]  [place] - [address]   [place]  - [address]   [place] - [address] - [address] [employer]  - [address] [place] - [address] ([facility]   If you have labs (blood work) drawn today and your tests are completely normal, you will receive your results only by: [employer] (if you have MyChart)  If you have any lab test that is abnormal or we need to change your treatment, we will call you or send a MyChart message to review the results.  Testing/Procedures: None ordered.  Follow-Up: At [facility], you and your health needs are our priority.  As part of our continuing mission to provide you with exceptional heart care, we have created designated Provider Care Teams.  These Care Teams include your primary Cardiologist (physician) and Advanced Practice Providers (APPs -  Physician Assistants and Nurse Practitioners) who all work together to provide you with the care you need, when you need it.  Your next appointment:   1 year(s)  The format for your next appointment:   In Person  Provider:   Vina Gull, MD

## 2023-11-23 LAB — NMR, LIPOPROFILE
Cholesterol, Total: 114 mg/dL (ref 100–199)
HDL Particle Number: 31.7 umol/L (ref >=30.5)
HDL-C: 33 mg/dL — ABNORMAL LOW (ref >39)
LDL Particle Number: 839 nmol/L (ref <1000)
LDL Size: 19.7 nm — ABNORMAL LOW (ref >20.5)
LDL-C (NIH Calc): 49 mg/dL (ref 0–99)
LP-IR Score: 75 — ABNORMAL HIGH (ref <=45)
Small LDL Particle Number: 630 nmol/L — ABNORMAL HIGH (ref <=527)
Triglycerides: 194 mg/dL — ABNORMAL HIGH (ref 0–149)

## 2023-11-23 LAB — LIPOPROTEIN A (LPA): Lipoprotein (a): 126.1 nmol/L — ABNORMAL HIGH (ref <75.0)

## 2023-11-24 ENCOUNTER — Ambulatory Visit: Payer: Self-pay | Admitting: Internal Medicine

## 2023-12-03 ENCOUNTER — Other Ambulatory Visit: Payer: Self-pay | Admitting: Nurse Practitioner

## 2023-12-03 DIAGNOSIS — F325 Major depressive disorder, single episode, in full remission: Secondary | ICD-10-CM

## 2023-12-03 MED ORDER — MIRTAZAPINE 30 MG PO TABS: 30.0000 mg | ORAL_TABLET | Freq: Every day | ORAL | 3 refills | Status: AC

## 2023-12-03 NOTE — Telephone Encounter (Signed)
 Copied from CRM [id number]. Topic: Clinical - Medication Refill >> Dec 03, 2023  1:04 PM Ashley R wrote: Medication:  mirtazapine  (REMERON ) 30 MG tablet   Has the patient contacted their pharmacy? Yes  This is the patient's preferred pharmacy:  [id number] [place], [place] - [address] AT [facility] [address] [place] [address] Phone: [phone] Fax: [phone]  Is this the correct pharmacy for this prescription? Yes   Has the prescription been filled recently? Yes  Is the patient out of the medication? Yes  Has the patient been seen for an appointment in the last year OR does the patient have an upcoming appointment? Yes  Can we respond through MyChart? Yes  Agent: Please be advised that Rx refills may take up to 3 business days. We ask that you follow-up with your pharmacy.

## 2023-12-17 NOTE — Progress Notes (Signed)
 Audrey Russell                                          MRN: [id number]   12/17/2023   The VBCI Quality Team Specialist reviewed this patient medical record for the purposes of chart review for care gap closure. The following were reviewed: chart review for care gap closure-kidney health evaluation for diabetes:eGFR  and uACR.    VBCI Quality Team

## 2024-01-06 NOTE — Progress Notes (Signed)
 Audrey Russell                                          MRN: [id number]   01/06/2024   The VBCI Quality Team Specialist reviewed this patient medical record for the purposes of chart review for care gap closure. The following were reviewed: chart review for care gap closure-kidney health evaluation for diabetes:eGFR  and uACR.    VBCI Quality Team

## 2024-01-12 ENCOUNTER — Encounter (INDEPENDENT_AMBULATORY_CARE_PROVIDER_SITE_OTHER): Payer: Self-pay

## 2024-01-12 ENCOUNTER — Telehealth (INDEPENDENT_AMBULATORY_CARE_PROVIDER_SITE_OTHER): Payer: Self-pay | Admitting: Audiology

## 2024-01-12 NOTE — Telephone Encounter (Signed)
 Called patient and left a voicemail message requesting a call back to reschedule appointment for a hearing evaluation on 01/26/2024 and follow up appointment with Dr. Karis after the hearing evaluation.  Dr. Tiney will be out of the office that day.  Also sent a MyChart message.

## 2024-01-19 ENCOUNTER — Other Ambulatory Visit: Payer: Self-pay | Admitting: Nurse Practitioner

## 2024-01-19 DIAGNOSIS — J449 Chronic obstructive pulmonary disease, unspecified: Secondary | ICD-10-CM

## 2024-01-21 NOTE — Telephone Encounter (Signed)
 Requesting: SYMBICORT  80/4. (120  ORAL INH)  Last Visit: 11/17/2023 Next Visit: 02/02/2024 Last Refill: 11/17/2023  Please Advise

## 2024-01-23 ENCOUNTER — Encounter (INDEPENDENT_AMBULATORY_CARE_PROVIDER_SITE_OTHER): Payer: Self-pay

## 2024-01-23 NOTE — Telephone Encounter (Signed)
 Called patient and left another voicemail message and sent another MyChart message requesting a call back to reschedule.

## 2024-01-26 ENCOUNTER — Ambulatory Visit (INDEPENDENT_AMBULATORY_CARE_PROVIDER_SITE_OTHER): Admitting: Otolaryngology

## 2024-01-26 ENCOUNTER — Ambulatory Visit (INDEPENDENT_AMBULATORY_CARE_PROVIDER_SITE_OTHER): Admitting: Audiology

## 2024-01-27 ENCOUNTER — Other Ambulatory Visit: Payer: Self-pay | Admitting: Nurse Practitioner

## 2024-01-27 DIAGNOSIS — F325 Major depressive disorder, single episode, in full remission: Secondary | ICD-10-CM

## 2024-01-27 DIAGNOSIS — H6993 Unspecified Eustachian tube disorder, bilateral: Secondary | ICD-10-CM

## 2024-01-28 ENCOUNTER — Other Ambulatory Visit: Payer: Self-pay | Admitting: Internal Medicine

## 2024-01-28 DIAGNOSIS — J4489 Other specified chronic obstructive pulmonary disease: Secondary | ICD-10-CM

## 2024-01-30 ENCOUNTER — Telehealth: Payer: Self-pay

## 2024-01-31 ENCOUNTER — Other Ambulatory Visit: Payer: Self-pay | Admitting: Family

## 2024-01-31 DIAGNOSIS — J4489 Other specified chronic obstructive pulmonary disease: Secondary | ICD-10-CM

## 2024-01-31 MED ORDER — FAMOTIDINE 20 MG PO TABS: ORAL_TABLET | ORAL | 1 refills | Status: AC

## 2024-02-02 ENCOUNTER — Ambulatory Visit: Admitting: Nurse Practitioner

## 2024-02-05 ENCOUNTER — Encounter: Payer: Self-pay | Admitting: Nurse Practitioner

## 2024-02-05 ENCOUNTER — Ambulatory Visit: Admitting: Nurse Practitioner

## 2024-02-05 ENCOUNTER — Ambulatory Visit: Payer: Self-pay | Admitting: Nurse Practitioner

## 2024-02-05 ENCOUNTER — Telehealth: Payer: Self-pay

## 2024-02-05 VITALS — BP 130/70 | HR 82 | Temp 98.4°F | Ht 67.0 in | Wt 146.2 lb

## 2024-02-05 DIAGNOSIS — M25562 Pain in left knee: Secondary | ICD-10-CM | POA: Diagnosis not present

## 2024-02-05 DIAGNOSIS — M549 Dorsalgia, unspecified: Secondary | ICD-10-CM

## 2024-02-05 DIAGNOSIS — E119 Type 2 diabetes mellitus without complications: Secondary | ICD-10-CM

## 2024-02-05 DIAGNOSIS — Z7984 Long term (current) use of oral hypoglycemic drugs: Secondary | ICD-10-CM | POA: Diagnosis not present

## 2024-02-05 DIAGNOSIS — J441 Chronic obstructive pulmonary disease with (acute) exacerbation: Secondary | ICD-10-CM | POA: Diagnosis not present

## 2024-02-05 DIAGNOSIS — Z85118 Personal history of other malignant neoplasm of bronchus and lung: Secondary | ICD-10-CM | POA: Diagnosis not present

## 2024-02-05 DIAGNOSIS — E1169 Type 2 diabetes mellitus with other specified complication: Secondary | ICD-10-CM

## 2024-02-05 LAB — RENAL FUNCTION PANEL
Albumin: 4.7 g/dL (ref 3.5–5.2)
BUN: 14 mg/dL (ref 6–23)
CO2: 27 meq/L (ref 19–32)
Calcium: 9.9 mg/dL (ref 8.4–10.5)
Chloride: 105 meq/L (ref 96–112)
Creatinine, Ser: 0.96 mg/dL (ref 0.40–1.20)
GFR: 60.98 mL/min
Glucose, Bld: 159 mg/dL — ABNORMAL HIGH (ref 70–99)
Phosphorus: 3.5 mg/dL (ref 2.3–4.6)
Potassium: 4.2 meq/L (ref 3.5–5.1)
Sodium: 141 meq/L (ref 135–145)

## 2024-02-05 LAB — D-DIMER, QUANTITATIVE: D-Dimer, Quant: 0.53 ug{FEU}/mL — ABNORMAL HIGH

## 2024-02-05 LAB — MICROALBUMIN / CREATININE URINE RATIO
Creatinine,U: 112 mg/dL
Microalb Creat Ratio: 65.3 mg/g — ABNORMAL HIGH (ref 0.0–30.0)
Microalb, Ur: 7.3 mg/dL — ABNORMAL HIGH (ref 0.7–1.9)

## 2024-02-05 LAB — HEMOGLOBIN A1C: Hgb A1c MFr Bld: 8 % — ABNORMAL HIGH (ref 4.6–6.5)

## 2024-02-05 MED ORDER — GUAIFENESIN-CODEINE 100-10 MG/5ML PO SOLN: 5.0000 mL | Freq: Four times a day (QID) | ORAL | 0 refills | Status: AC | PRN

## 2024-02-05 MED ORDER — PREDNISONE 10 MG PO TABS: ORAL_TABLET | ORAL | 0 refills | Status: AC

## 2024-02-05 MED ORDER — DM-GUAIFENESIN ER 30-600 MG PO TB12: 1.0000 | ORAL_TABLET | Freq: Two times a day (BID) | ORAL | Status: DC | PRN

## 2024-02-05 MED ORDER — HYDROCODONE BIT-HOMATROP MBR 5-1.5 MG/5ML PO SOLN: 5.0000 mL | Freq: Three times a day (TID) | ORAL | 0 refills | Status: DC | PRN

## 2024-02-05 NOTE — Assessment & Plan Note (Addendum)
 Under the care of Dr. Darlean and oncology. Scheduled for annual CT chest Last CT chest 07/2023: Lung-RADS 1, negative. Continue annual screening with low-dose chest CT without contrast in 12 months. One vessel coronary atherosclerosis. Mildly enlarged posterior paraesophageal lymph node is stable from 02/22/2022 CT, nonspecific, more likely reactive. Aortic Atherosclerosis (ICD10-I70.0) and Emphysema (ICD10-J43.9).

## 2024-02-05 NOTE — Patient Instructions (Addendum)
 Go to lab for blood draw, urine collection, and CXR. Maintain current med doses Use mucinex  Dm and hycodan prn for cough Maintain adequate oral hydration Call office if fasting glucose >250 for more than 3days

## 2024-02-05 NOTE — Telephone Encounter (Signed)
 Copied from CRM [id number]. Topic: Clinical - Prescription Issue >> Feb 05, 2024 12:34 PM Shereese L wrote: Reason for CRM: patient stated that she needs an alternative medication for the HYDROcodone  bit-homatropine (HYCODAN) 5-1.5 MG/5ML syrup due to the co-pay being so high. Patient would like call to discuss it

## 2024-02-05 NOTE — Progress Notes (Signed)
 "               Established Patient Visit  Patient: Audrey Russell   DOB: 1955/10/08   69 y.o. Female  MRN: [id number] Visit Date: 02/05/2024  Subjective:    Chief Complaint  Patient presents with   Follow-up    FASTING 6 month follow up form HTN, DM and hyperlipidemia  DUE for Mammogram, Colonoscopy,  Refills needed for Crestor  and Zetia     Knee Pain  The incident occurred more than 1 week ago. There was no injury mechanism. The pain is present in the left knee. The quality of the pain is described as aching. The pain has been Intermittent since onset. Associated symptoms include an inability to bear weight and muscle weakness. Pertinent negatives include no loss of motion, loss of sensation, numbness or tingling. Associated symptoms comments: Joint instability when standing, going up or down stairs. The symptoms are aggravated by weight bearing. She has tried nothing for the symptoms.   History of lung cancer Under the care of Dr. Darlean and oncology. Scheduled for annual CT chest Last CT chest 07/2023: Lung-RADS 1, negative. Continue annual screening with low-dose chest CT without contrast in 12 months. One vessel coronary atherosclerosis. Mildly enlarged posterior paraesophageal lymph node is stable from 02/22/2022 CT, nonspecific, more likely reactive. Aortic Atherosclerosis (ICD10-I70.0) and Emphysema (ICD10-J43.9).  DM (diabetes mellitus) (HCC) No adverse effects with metformin  and glipizide  5mg  Current use of statin BP at goal Positive neuropathy Negative retinopathy  Repeat hgbA1c, UACr, lipid panel, CMP Maintain med doses F/up in 3months  Chronic obstructive pulmonary disease with acute exacerbation (HCC) Current use of Symbicort  and albuterol  Reports worsening cough in PM, non productive, associated with upper back pain x90month. No worsening SOB or PND No LE edema, no CP. Wt Readings from Last 3 Encounters:  02/05/24 146 lb 3.2 oz (66.3 kg)  11/21/23 145 lb  9.6 oz (66 kg)  11/17/23 150 lb 9.6 oz (68.3 kg)    Obtain CXR Start oral prednisone  tapered dose pack Send guaifenesin /codeine . F/up with pulmonology if no improvement   Reviewed medical, surgical, and social history today  Medications: Show/hide medication list[1] Reviewed past medical and social history.   ROS per HPI above      Objective:  BP 130/70 (BP Location: Left Arm, Patient Position: Sitting)   Pulse 82   Temp 98.4 F (36.9 C) (Oral)   Ht 5' 7 (1.702 m)   Wt 146 lb 3.2 oz (66.3 kg)   BMI 22.90 kg/m      Physical Exam Vitals and nursing note reviewed.  Cardiovascular:     Rate and Rhythm: Normal rate and regular rhythm.     Pulses: Normal pulses.     Heart sounds: Normal heart sounds.  Pulmonary:     Effort: Pulmonary effort is normal.     Breath sounds: Normal breath sounds.  Musculoskeletal:     Right knee: Crepitus present. No swelling, deformity, effusion, erythema or bony tenderness. Normal range of motion. No tenderness.     Instability Tests: Anterior drawer test negative. Posterior drawer test negative.     Left knee: Crepitus present. No swelling, deformity, effusion, erythema or bony tenderness. Normal range of motion. No tenderness.     Instability Tests: Anterior drawer test negative. Posterior drawer test negative.     Right lower leg: Normal.     Left lower leg: Normal.  Neurological:     Mental Status: She is alert and oriented to person,  place, and time.     Results for orders placed or performed in visit on 02/05/24  D-Dimer, Quantitative  Result Value Ref Range   D-Dimer, Quant 0.53 (H) <0.50 mcg/mL FEU      Assessment & Plan:    Problem List Items Addressed This Visit     Chronic obstructive pulmonary disease with acute exacerbation (HCC)   Current use of Symbicort  and albuterol  Reports worsening cough in PM, non productive, associated with upper back pain x97month. No worsening SOB or PND No LE edema, no CP. Wt Readings  from Last 3 Encounters:  02/05/24 146 lb 3.2 oz (66.3 kg)  11/21/23 145 lb 9.6 oz (66 kg)  11/17/23 150 lb 9.6 oz (68.3 kg)    Obtain CXR Start oral prednisone  tapered dose pack Send guaifenesin /codeine . F/up with pulmonology if no improvement      Relevant Medications   predniSONE  (DELTASONE ) 10 MG tablet   Other Relevant Orders   DG Chest 2 View   DM (diabetes mellitus) (HCC) - Primary   No adverse effects with metformin  and glipizide  5mg  Current use of statin BP at goal Positive neuropathy Negative retinopathy  Repeat hgbA1c, UACr, lipid panel, CMP Maintain med doses F/up in 3months      Relevant Orders   Hemoglobin A1c   Renal Function Panel   Microalbumin / creatinine urine ratio   History of lung cancer   Under the care of Dr. Darlean and oncology. Scheduled for annual CT chest Last CT chest 07/2023: Lung-RADS 1, negative. Continue annual screening with low-dose chest CT without contrast in 12 months. One vessel coronary atherosclerosis. Mildly enlarged posterior paraesophageal lymph node is stable from 02/22/2022 CT, nonspecific, more likely reactive. Aortic Atherosclerosis (ICD10-I70.0) and Emphysema (ICD10-J43.9).      Other Visit Diagnoses       Upper back pain       Relevant Medications   predniSONE  (DELTASONE ) 10 MG tablet   Other Relevant Orders   D-Dimer, Quantitative (Completed)     Acute pain of left knee       Relevant Orders   Ambulatory referral to Physical Therapy      Return in about 3 months (around 05/05/2024) for HTN, DM, hyperlipidemia (fasting).     Roselie Mood, NP      [1]  Outpatient Medications Prior to Visit  Medication Sig   aspirin  EC 81 MG tablet Take 1 tablet (81 mg total) by mouth daily.   Blood Glucose Monitoring Suppl DEVI 1 each by Does not apply route in the morning. Dispense based on patient and insurance preference (FOR ICD-10 E10.9, E11.9).   budesonide -formoterol  (SYMBICORT ) 80-4.5 MCG/ACT inhaler INHALE 2  PUFFS INTO THE LUNGS DAILY. RINSE MOUTH AFTER EACH USE   citalopram  (CELEXA ) 40 MG tablet TAKE 1 TABLET BY MOUTH EVERY DAY   ezetimibe  (ZETIA ) 10 MG tablet TAKE 1 TABLET(10 MG) BY MOUTH DAILY   famotidine  (PEPCID ) 20 MG tablet One after supper   fenofibrate  160 MG tablet Take 1 tablet (160 mg total) by mouth daily.   fluticasone  (FLONASE ) 50 MCG/ACT nasal spray SHAKE LIQUID AND USE 2 SPRAYS IN EACH NOSTRIL EVERY DAY   glipiZIDE  (GLUCOTROL ) 5 MG tablet Take 1 tablet (5 mg total) by mouth 2 (two) times daily before a meal.   Glucose Blood (BLOOD GLUCOSE TEST STRIPS) STRP 1 each by Does not apply route in the morning. Dispense based on patient and insurance preference. (FOR ICD-10 E10.9, E11.9).   Lancet Device MISC 1 each by  Does not apply route in the morning. Dispense based on patient and insurance preference. (FOR ICD-10 E10.9, E11.9).   Lancets MISC 1 each by Does not apply route in the morning. Dispense based on patient and insurance preference. (FOR ICD-10 E10.9, E11.9).   metFORMIN  (GLUCOPHAGE ) 850 MG tablet Take 1 tablet (850 mg total) by mouth 2 (two) times daily with a meal.   mirtazapine  (REMERON ) 30 MG tablet Take 1 tablet (30 mg total) by mouth at bedtime.   pantoprazole  (PROTONIX ) 40 MG tablet TAKE 1 TABLET(40 MG) BY MOUTH DAILY 30 TO 60 MINUTES BEFORE FIRST MEAL OF THE DAY   rosuvastatin  (CRESTOR ) 40 MG tablet TAKE 1 TABLET(40 MG) BY MOUTH DAILY   VENTOLIN  HFA 108 (90 Base) MCG/ACT inhaler INHALE 1 TO 2 PUFFS INTO THE LUNGS EVERY 6 HOURS AS NEEDED FOR WHEEZING OR SHORTNESS OF BREATH   No facility-administered medications prior to visit.   "

## 2024-02-05 NOTE — Assessment & Plan Note (Addendum)
 Current use of Symbicort  and albuterol  Reports worsening cough in PM, non productive, associated with upper back pain x1month. No worsening SOB or PND No LE edema, no CP. Wt Readings from Last 3 Encounters:  02/05/24 146 lb 3.2 oz (66.3 kg)  11/21/23 145 lb 9.6 oz (66 kg)  11/17/23 150 lb 9.6 oz (68.3 kg)    Obtain CXR Start oral prednisone  tapered dose pack Send guaifenesin /codeine . F/up with pulmonology if no improvement

## 2024-02-05 NOTE — Assessment & Plan Note (Signed)
 No adverse effects with metformin  and glipizide  5mg  Current use of statin BP at goal Positive neuropathy Negative retinopathy  Repeat hgbA1c, UACr, lipid panel, CMP Maintain med doses F/up in 3months

## 2024-02-05 NOTE — Assessment & Plan Note (Deleted)
 Stable Chronic bilateral LE weakness

## 2024-02-06 ENCOUNTER — Other Ambulatory Visit

## 2024-02-06 NOTE — Telephone Encounter (Signed)
 Patient called and informed of the medication change from the Hydrocodone -bithomatropine to Guaifenesin -codeine  take 5 mL every 6 hours as needed. She thanked me for calling. All questions (if any) were answered.

## 2024-02-09 ENCOUNTER — Other Ambulatory Visit

## 2024-02-09 ENCOUNTER — Ambulatory Visit

## 2024-02-09 DIAGNOSIS — J441 Chronic obstructive pulmonary disease with (acute) exacerbation: Secondary | ICD-10-CM | POA: Diagnosis not present

## 2024-02-11 NOTE — Progress Notes (Signed)
 Last read by Lolita Alana Slade at 8:58PM on 02/09/2024.

## 2024-02-17 ENCOUNTER — Ambulatory Visit: Admitting: Nurse Practitioner

## 2024-02-17 NOTE — Telephone Encounter (Signed)
 SABRA

## 2024-02-20 ENCOUNTER — Other Ambulatory Visit: Payer: Self-pay | Admitting: Nurse Practitioner

## 2024-02-20 DIAGNOSIS — E1169 Type 2 diabetes mellitus with other specified complication: Secondary | ICD-10-CM

## 2024-03-10 ENCOUNTER — Ambulatory Visit (INDEPENDENT_AMBULATORY_CARE_PROVIDER_SITE_OTHER)

## 2024-03-10 ENCOUNTER — Ambulatory Visit (INDEPENDENT_AMBULATORY_CARE_PROVIDER_SITE_OTHER): Admitting: Otolaryngology

## 2024-05-05 ENCOUNTER — Ambulatory Visit: Admitting: Nurse Practitioner
